# Patient Record
Sex: Female | Born: 1935 | Race: White | Hispanic: No | Smoking: Former smoker
Health system: Southern US, Community
[De-identification: ages and names within clinical notes are randomized; demographics above are authoritative.]

## PROBLEM LIST (undated history)

## (undated) DIAGNOSIS — I4891 Unspecified atrial fibrillation: Secondary | ICD-10-CM

## (undated) DIAGNOSIS — J449 Chronic obstructive pulmonary disease, unspecified: Secondary | ICD-10-CM

## (undated) DIAGNOSIS — I10 Essential (primary) hypertension: Secondary | ICD-10-CM

## (undated) DIAGNOSIS — E119 Type 2 diabetes mellitus without complications: Secondary | ICD-10-CM

## (undated) DIAGNOSIS — K219 Gastro-esophageal reflux disease without esophagitis: Secondary | ICD-10-CM

## (undated) DIAGNOSIS — Z955 Presence of coronary angioplasty implant and graft: Secondary | ICD-10-CM

## (undated) DIAGNOSIS — Z974 Presence of external hearing-aid: Secondary | ICD-10-CM

## (undated) DIAGNOSIS — Z972 Presence of dental prosthetic device (complete) (partial): Secondary | ICD-10-CM

## (undated) DIAGNOSIS — I509 Heart failure, unspecified: Secondary | ICD-10-CM

## (undated) DIAGNOSIS — Z8611 Personal history of tuberculosis: Secondary | ICD-10-CM

## (undated) DIAGNOSIS — E785 Hyperlipidemia, unspecified: Secondary | ICD-10-CM

## (undated) DIAGNOSIS — J45909 Unspecified asthma, uncomplicated: Secondary | ICD-10-CM

## (undated) DIAGNOSIS — I2510 Atherosclerotic heart disease of native coronary artery without angina pectoris: Secondary | ICD-10-CM

## (undated) HISTORY — PX: NO PAST SURGERIES: SHX2092

## (undated) HISTORY — PX: CATARACT EXTRACTION: SUR2

## (undated) HISTORY — DX: Heart failure, unspecified: I50.9

## (undated) HISTORY — PX: KNEE SURGERY: SHX244

## (undated) HISTORY — PX: CORONARY/GRAFT ACUTE MI REVASCULARIZATION: CATH118305

---

## 2014-04-25 DIAGNOSIS — Z8673 Personal history of transient ischemic attack (TIA), and cerebral infarction without residual deficits: Secondary | ICD-10-CM

## 2014-04-25 HISTORY — DX: Personal history of transient ischemic attack (TIA), and cerebral infarction without residual deficits: Z86.73

## 2015-04-26 DIAGNOSIS — I639 Cerebral infarction, unspecified: Secondary | ICD-10-CM

## 2015-04-26 HISTORY — DX: Cerebral infarction, unspecified: I63.9

## 2020-04-16 DIAGNOSIS — I10 Essential (primary) hypertension: Secondary | ICD-10-CM | POA: Insufficient documentation

## 2020-06-19 DIAGNOSIS — E119 Type 2 diabetes mellitus without complications: Secondary | ICD-10-CM | POA: Insufficient documentation

## 2020-06-19 DIAGNOSIS — Z955 Presence of coronary angioplasty implant and graft: Secondary | ICD-10-CM

## 2020-06-19 HISTORY — DX: Presence of coronary angioplasty implant and graft: Z95.5

## 2020-11-16 ENCOUNTER — Other Ambulatory Visit: Payer: Self-pay

## 2020-11-16 ENCOUNTER — Encounter: Payer: Self-pay | Admitting: Family Medicine

## 2020-11-16 ENCOUNTER — Inpatient Hospital Stay
Admission: EM | Admit: 2020-11-16 | Discharge: 2020-11-23 | DRG: 522 | Disposition: A | Discharge status: Rehabilitation Facility | Payer: Medicaid Other | Attending: Internal Medicine | Admitting: Internal Medicine

## 2020-11-16 ENCOUNTER — Emergency Department: Payer: Medicaid Other

## 2020-11-16 DIAGNOSIS — K219 Gastro-esophageal reflux disease without esophagitis: Secondary | ICD-10-CM | POA: Diagnosis present

## 2020-11-16 DIAGNOSIS — K5900 Constipation, unspecified: Secondary | ICD-10-CM

## 2020-11-16 DIAGNOSIS — Z20822 Contact with and (suspected) exposure to covid-19: Secondary | ICD-10-CM | POA: Diagnosis present

## 2020-11-16 DIAGNOSIS — Z681 Body mass index (BMI) 19 or less, adult: Secondary | ICD-10-CM

## 2020-11-16 DIAGNOSIS — I4819 Other persistent atrial fibrillation: Secondary | ICD-10-CM | POA: Diagnosis present

## 2020-11-16 DIAGNOSIS — S72009A Fracture of unspecified part of neck of unspecified femur, initial encounter for closed fracture: Secondary | ICD-10-CM

## 2020-11-16 DIAGNOSIS — W010XXA Fall on same level from slipping, tripping and stumbling without subsequent striking against object, initial encounter: Secondary | ICD-10-CM | POA: Diagnosis present

## 2020-11-16 DIAGNOSIS — S72001A Fracture of unspecified part of neck of right femur, initial encounter for closed fracture: Secondary | ICD-10-CM | POA: Diagnosis present

## 2020-11-16 DIAGNOSIS — I2510 Atherosclerotic heart disease of native coronary artery without angina pectoris: Secondary | ICD-10-CM | POA: Diagnosis present

## 2020-11-16 DIAGNOSIS — Z7984 Long term (current) use of oral hypoglycemic drugs: Secondary | ICD-10-CM | POA: Diagnosis not present

## 2020-11-16 DIAGNOSIS — E785 Hyperlipidemia, unspecified: Secondary | ICD-10-CM | POA: Diagnosis present

## 2020-11-16 DIAGNOSIS — Z79899 Other long term (current) drug therapy: Secondary | ICD-10-CM | POA: Diagnosis not present

## 2020-11-16 DIAGNOSIS — I10 Essential (primary) hypertension: Secondary | ICD-10-CM | POA: Diagnosis present

## 2020-11-16 DIAGNOSIS — E119 Type 2 diabetes mellitus without complications: Secondary | ICD-10-CM | POA: Diagnosis present

## 2020-11-16 DIAGNOSIS — S72001K Fracture of unspecified part of neck of right femur, subsequent encounter for closed fracture with nonunion: Secondary | ICD-10-CM

## 2020-11-16 DIAGNOSIS — E440 Moderate protein-calorie malnutrition: Secondary | ICD-10-CM | POA: Diagnosis present

## 2020-11-16 DIAGNOSIS — W19XXXA Unspecified fall, initial encounter: Secondary | ICD-10-CM

## 2020-11-16 DIAGNOSIS — R0602 Shortness of breath: Secondary | ICD-10-CM

## 2020-11-16 HISTORY — DX: Gastro-esophageal reflux disease without esophagitis: K21.9

## 2020-11-16 HISTORY — DX: Fracture of unspecified part of neck of unspecified femur, initial encounter for closed fracture: S72.009A

## 2020-11-16 HISTORY — DX: Unspecified atrial fibrillation: I48.91

## 2020-11-16 HISTORY — DX: Hyperlipidemia, unspecified: E78.5

## 2020-11-16 HISTORY — DX: Essential (primary) hypertension: I10

## 2020-11-16 HISTORY — DX: Type 2 diabetes mellitus without complications: E11.9

## 2020-11-16 LAB — COMPREHENSIVE METABOLIC PANEL
ALT: 21 U/L (ref 0–44)
AST: 27 U/L (ref 15–41)
Albumin: 3.1 g/dL — ABNORMAL LOW (ref 3.5–5.0)
Alkaline Phosphatase: 104 U/L (ref 38–126)
Anion gap: 9 (ref 5–15)
BUN: 25 mg/dL — ABNORMAL HIGH (ref 8–23)
CO2: 24 mmol/L (ref 22–32)
Calcium: 8.9 mg/dL (ref 8.9–10.3)
Chloride: 101 mmol/L (ref 98–111)
Creatinine, Ser: 0.86 mg/dL (ref 0.44–1.00)
GFR, Estimated: >60 mL/min (ref >60)
Glucose, Bld: 111 mg/dL — ABNORMAL HIGH (ref 70–99)
Potassium: 4.4 mmol/L (ref 3.5–5.1)
Sodium: 134 mmol/L — ABNORMAL LOW (ref 135–145)
Total Bilirubin: 1 mg/dL (ref 0.3–1.2)
Total Protein: 6.5 g/dL (ref 6.5–8.1)

## 2020-11-16 LAB — CBC WITH DIFFERENTIAL/PLATELET
Abs Immature Granulocytes: 0.11 10*3/uL — ABNORMAL HIGH (ref 0.00–0.07)
Basophils Absolute: 0.1 10*3/uL (ref 0.0–0.1)
Basophils Relative: 1 %
Eosinophils Absolute: 0.5 10*3/uL (ref 0.0–0.5)
Eosinophils Relative: 4 %
HCT: 37.5 % (ref 36.0–46.0)
Hemoglobin: 11.9 g/dL — ABNORMAL LOW (ref 12.0–15.0)
Immature Granulocytes: 1 %
Lymphocytes Relative: 13 %
Lymphs Abs: 1.8 10*3/uL (ref 0.7–4.0)
MCH: 27.8 pg (ref 26.0–34.0)
MCHC: 31.7 g/dL (ref 30.0–36.0)
MCV: 87.6 fL (ref 80.0–100.0)
Monocytes Absolute: 1.5 10*3/uL — ABNORMAL HIGH (ref 0.1–1.0)
Monocytes Relative: 11 %
Neutro Abs: 9.8 10*3/uL — ABNORMAL HIGH (ref 1.7–7.7)
Neutrophils Relative %: 70 %
Platelets: 367 10*3/uL (ref 150–400)
RBC: 4.28 MIL/uL (ref 3.87–5.11)
RDW: 15.4 % (ref 11.5–15.5)
WBC: 13.8 10*3/uL — ABNORMAL HIGH (ref 4.0–10.5)
nRBC: 0 % (ref 0.0–0.2)

## 2020-11-16 LAB — RESP PANEL BY RT-PCR (FLU A&B, COVID) ARPGX2
Influenza A by PCR: NEGATIVE
Influenza B by PCR: NEGATIVE
SARS Coronavirus 2 by RT PCR: NEGATIVE

## 2020-11-16 LAB — TYPE AND SCREEN
ABO/RH(D): O POS
Antibody Screen: NEGATIVE

## 2020-11-16 LAB — PROTIME-INR
INR: 1.6 — ABNORMAL HIGH (ref 0.8–1.2)
Prothrombin Time: 19 seconds — ABNORMAL HIGH (ref 11.4–15.2)

## 2020-11-16 LAB — TROPONIN I (HIGH SENSITIVITY): Troponin I (High Sensitivity): 7 ng/L (ref <18)

## 2020-11-16 MED ORDER — ACETAMINOPHEN 650 MG RE SUPP: 650.0000 mg | Freq: Four times a day (QID) | RECTAL | Status: DC | PRN

## 2020-11-16 MED ORDER — ARFORMOTEROL TARTRATE 15 MCG/2ML IN NEBU
15.0000 ug | INHALATION_SOLUTION | Freq: Two times a day (BID) | RESPIRATORY_TRACT | Status: DC
  Filled 2020-11-16 (×2): qty 2

## 2020-11-16 MED ORDER — UMECLIDINIUM BROMIDE 62.5 MCG/INH IN AEPB: 1.0000 | INHALATION_SPRAY | Freq: Every day | RESPIRATORY_TRACT | Status: DC

## 2020-11-16 MED ORDER — INSULIN ASPART 100 UNIT/ML IJ SOLN
0.0000 [IU] | Freq: Three times a day (TID) | INTRAMUSCULAR | Status: DC
  Administered 2020-11-19: 2 [IU] via SUBCUTANEOUS
  Administered 2020-11-20 – 2020-11-21 (×3): 1 [IU] via SUBCUTANEOUS
  Administered 2020-11-21 – 2020-11-22 (×3): 2 [IU] via SUBCUTANEOUS
  Administered 2020-11-23 (×2): 1 [IU] via SUBCUTANEOUS
  Filled 2020-11-16 (×8): qty 1

## 2020-11-16 MED ORDER — NON FORMULARY: 2.0000 | Freq: Two times a day (BID) | Status: DC

## 2020-11-16 MED ORDER — CEFAZOLIN SODIUM-DEXTROSE 2-4 GM/100ML-% IV SOLN
2.0000 g | INTRAVENOUS | Status: AC
  Administered 2020-11-19: 2 g via INTRAVENOUS

## 2020-11-16 MED ORDER — FLUTICASONE PROPIONATE HFA 44 MCG/ACT IN AERO
2.0000 | INHALATION_SPRAY | Freq: Two times a day (BID) | RESPIRATORY_TRACT | Status: DC
  Filled 2020-11-16: qty 1

## 2020-11-16 MED ORDER — ALBUTEROL SULFATE HFA 108 (90 BASE) MCG/ACT IN AERS
1.0000 | INHALATION_SPRAY | Freq: Four times a day (QID) | RESPIRATORY_TRACT | Status: DC | PRN
  Filled 2020-11-16: qty 6.7

## 2020-11-16 MED ORDER — ALBUTEROL SULFATE (2.5 MG/3ML) 0.083% IN NEBU: 2.5000 mg | INHALATION_SOLUTION | Freq: Four times a day (QID) | RESPIRATORY_TRACT | Status: DC | PRN

## 2020-11-16 MED ORDER — BUDESONIDE 0.25 MG/2ML IN SUSP: 0.2500 mg | Freq: Two times a day (BID) | RESPIRATORY_TRACT | Status: DC

## 2020-11-16 MED ORDER — ATORVASTATIN CALCIUM 20 MG PO TABS
80.0000 mg | ORAL_TABLET | Freq: Every evening | ORAL | Status: DC
  Administered 2020-11-16 – 2020-11-22 (×7): 80 mg via ORAL
  Filled 2020-11-16 (×7): qty 4

## 2020-11-16 MED ORDER — ALBUTEROL SULFATE HFA 108 (90 BASE) MCG/ACT IN AERS: 1.0000 | INHALATION_SPRAY | Freq: Four times a day (QID) | RESPIRATORY_TRACT | Status: DC | PRN

## 2020-11-16 MED ORDER — ONDANSETRON HCL 4 MG/2ML IJ SOLN
4.0000 mg | Freq: Four times a day (QID) | INTRAMUSCULAR | Status: DC | PRN
  Administered 2020-11-16 – 2020-11-18 (×4): 4 mg via INTRAVENOUS
  Filled 2020-11-16 (×4): qty 2

## 2020-11-16 MED ORDER — UMECLIDINIUM BROMIDE 62.5 MCG/INH IN AEPB
1.0000 | INHALATION_SPRAY | Freq: Every day | RESPIRATORY_TRACT | Status: DC
  Filled 2020-11-16: qty 7

## 2020-11-16 MED ORDER — ARFORMOTEROL TARTRATE 15 MCG/2ML IN NEBU: 15.0000 ug | INHALATION_SOLUTION | Freq: Two times a day (BID) | RESPIRATORY_TRACT | Status: DC

## 2020-11-16 MED ORDER — INSULIN ASPART 100 UNIT/ML IJ SOLN: 0.0000 [IU] | Freq: Every day | INTRAMUSCULAR | Status: DC

## 2020-11-16 MED ORDER — RAMIPRIL 10 MG PO CAPS
10.0000 mg | ORAL_CAPSULE | Freq: Every day | ORAL | Status: DC
  Administered 2020-11-17 – 2020-11-18 (×2): 10 mg via ORAL
  Filled 2020-11-16 (×2): qty 1

## 2020-11-16 MED ORDER — POLYETHYLENE GLYCOL 3350 17 G PO PACK
17.0000 g | PACK | Freq: Every day | ORAL | Status: DC | PRN
  Administered 2020-11-17 – 2020-11-21 (×3): 17 g via ORAL
  Filled 2020-11-16 (×4): qty 1

## 2020-11-16 MED ORDER — KETOROLAC TROMETHAMINE 30 MG/ML IJ SOLN: 15.0000 mg | Freq: Four times a day (QID) | INTRAMUSCULAR | Status: DC | PRN

## 2020-11-16 MED ORDER — BECLOMETHASONE DIPROP HFA 80 MCG/ACT IN AERB
1.0000 | INHALATION_SPRAY | Freq: Two times a day (BID) | RESPIRATORY_TRACT | Status: DC
  Administered 2020-11-17 – 2020-11-23 (×12): 1 via RESPIRATORY_TRACT

## 2020-11-16 MED ORDER — METFORMIN HCL 500 MG PO TABS
500.0000 mg | ORAL_TABLET | Freq: Every evening | ORAL | Status: DC
  Administered 2020-11-16 – 2020-11-17 (×2): 500 mg via ORAL
  Filled 2020-11-16 (×2): qty 1

## 2020-11-16 MED ORDER — MUPIROCIN 2 % EX OINT
1.0000 "application " | TOPICAL_OINTMENT | Freq: Two times a day (BID) | CUTANEOUS | Status: AC
  Administered 2020-11-17 – 2020-11-20 (×6): 1 via NASAL
  Filled 2020-11-16: qty 22

## 2020-11-16 MED ORDER — DILTIAZEM HCL 30 MG PO TABS
60.0000 mg | ORAL_TABLET | Freq: Two times a day (BID) | ORAL | Status: DC
  Administered 2020-11-16 – 2020-11-23 (×13): 60 mg via ORAL
  Filled 2020-11-16 (×13): qty 2

## 2020-11-16 MED ORDER — OXYCODONE-ACETAMINOPHEN 5-325 MG PO TABS: 1.0000 | ORAL_TABLET | Freq: Four times a day (QID) | ORAL | Status: DC | PRN

## 2020-11-16 MED ORDER — ONDANSETRON HCL 4 MG PO TABS
4.0000 mg | ORAL_TABLET | Freq: Four times a day (QID) | ORAL | Status: DC | PRN
  Filled 2020-11-16: qty 1

## 2020-11-16 MED ORDER — GLYCOPYRROLATE-FORMOTEROL 9-4.8 MCG/ACT IN AERO
2.0000 | INHALATION_SPRAY | Freq: Two times a day (BID) | RESPIRATORY_TRACT | Status: DC
  Administered 2020-11-17 – 2020-11-23 (×12): 2 via RESPIRATORY_TRACT
  Filled 2020-11-16: qty 1

## 2020-11-16 MED ORDER — FENTANYL CITRATE (PF) 100 MCG/2ML IJ SOLN
50.0000 ug | INTRAMUSCULAR | Status: DC | PRN
Start: 2020-11-16 — End: 2020-11-20

## 2020-11-16 MED ORDER — PANTOPRAZOLE SODIUM 20 MG PO TBEC
20.0000 mg | DELAYED_RELEASE_TABLET | Freq: Every day | ORAL | Status: DC
  Administered 2020-11-17 – 2020-11-23 (×6): 20 mg via ORAL
  Filled 2020-11-16 (×7): qty 1

## 2020-11-16 MED ORDER — OXYCODONE-ACETAMINOPHEN 5-325 MG PO TABS
1.0000 | ORAL_TABLET | Freq: Four times a day (QID) | ORAL | Status: DC | PRN
  Administered 2020-11-17 – 2020-11-18 (×3): 1 via ORAL
  Filled 2020-11-16 (×4): qty 1

## 2020-11-16 MED ORDER — BISOPROLOL FUMARATE 5 MG PO TABS
5.0000 mg | ORAL_TABLET | Freq: Two times a day (BID) | ORAL | Status: DC
  Administered 2020-11-16 – 2020-11-23 (×13): 5 mg via ORAL
  Filled 2020-11-16 (×16): qty 1

## 2020-11-16 MED ORDER — ACETAMINOPHEN 325 MG PO TABS
650.0000 mg | ORAL_TABLET | Freq: Four times a day (QID) | ORAL | Status: DC | PRN
  Administered 2020-11-16 – 2020-11-17 (×2): 650 mg via ORAL
  Filled 2020-11-16 (×2): qty 2

## 2020-11-16 MED ORDER — MORPHINE SULFATE (PF) 2 MG/ML IV SOLN
2.0000 mg | INTRAVENOUS | Status: DC | PRN
  Administered 2020-11-16 – 2020-11-18 (×6): 2 mg via INTRAVENOUS
  Filled 2020-11-16 (×6): qty 1

## 2020-11-16 NOTE — H&P (Addendum)
History and Physical    Angelica Ferguson GEX:[id number] DOB: [birth year] DOA: 11/16/2020  PCP: Angelica Kotyk, NP  Chief Complaint: Right hip pain  HPI: Angelica Ferguson is a 87 y.o. female with a past medical history of persistent atrial fibrillation on edoxaban, coronary artery disease with PCI, hypertension, non-insulin-dependent diabetes mellitus type 2, GERD.  The patient is visiting family from [place].  She had a mechanical fall last Wednesday.  Has been unable to ambulate and is still having right hip pain since.  Therefore came to the emergency department for further evaluation of the right hip pain.  Hemodynamics are stable.  Plain film imaging shows a right femoral neck fracture with varus angulation.  Orthopedics has been contacted and Dr. Martha Ferguson plans to see the patient after clinic later this afternoon.  Last dose of edoxaban was early this morning.  We will hold this medication.  N.p.o. after midnight.  Daughter present at bedside. Patient denies any urinary complaints, cough, shortness of breath, nausea, vomiting or diarrhea.    ED Course: CBC, CMP, INR, troponin, type/screen.  Right shoulder x-ray.  Right hip/pelvis x-ray.  Review of Systems: 14 point review of systems is negative except for what is mentioned above in the HPI.   Past Medical History:  Diagnosis Date   Atrial fibrillation (HCC)    Diabetes mellitus without complication (HCC)    GERD (gastroesophageal reflux disease)    Hyperlipidemia    Hypertension     Past Surgical History:  Procedure Laterality Date   CATARACT EXTRACTION Left    CORONARY/GRAFT ACUTE MI REVASCULARIZATION     KNEE SURGERY Left    NO PAST SURGERIES      Social History   Socioeconomic History   Marital status: Widowed    Spouse name: Not on file   Number of children: Not on file   Years of education: Not on file   Highest education level: Not on file  Occupational History   Not on file  Tobacco Use   Smoking status:  Not on file   Smokeless tobacco: Not on file  Substance and Sexual Activity   Alcohol use: Not on file   Drug use: Not on file   Sexual activity: Not on file  Other Topics Concern   Not on file  Social History Narrative   Not on file   Social Determinants of Health   Financial Resource Strain: Not on file  Food Insecurity: Not on file  Transportation Needs: Not on file  Physical Activity: Not on file  Stress: Not on file  Social Connections: Not on file  Intimate Partner Violence: Not on file    No Known Allergies  Family history: Reviewed and felt to be noncontributory.  Prior to Admission medications   Medication Sig Start Date End Date Taking? Authorizing Provider  albuterol (VENTOLIN HFA) 108 (90 Base) MCG/ACT inhaler Inhale 1 puff into the lungs every 6 (six) hours as needed for wheezing or shortness of breath.   Yes [provider]  atorvastatin (LIPITOR) 80 MG tablet Take 80 mg by mouth every evening.   Yes [provider]  bisoprolol (ZEBETA) 5 MG tablet Take 1-2 mg by mouth See admin instructions. Take 7.5 mg in the morning, and 5 mg in the evening   Yes [provider]  diltiazem (CARDIZEM) 60 MG tablet Take 60 mg by mouth 2 (two) times daily.   Yes [provider]  edoxaban (SAVAYSA) 30 MG TABS tablet Take 30 mg by mouth  daily.   Yes [provider]  Glycopyrrolate-Formoterol (BEVESPI AEROSPHERE) 9-4.8 MCG/ACT AERO Inhale 2 puffs into the lungs 2 (two) times daily.   Yes [provider]  lansoprazole (PREVACID) 15 MG capsule Take 15 mg by mouth daily.   Yes [provider]  metFORMIN (GLUCOPHAGE) 500 MG tablet Take 500 mg by mouth every evening.   Yes [provider]  ramipril (ALTACE) 10 MG capsule Take 10 mg by mouth daily.   Yes [provider]    Physical Exam: Vitals:   11/16/20 1116 11/16/20 1557  BP: (!) 140/115 (!) 142/87  Pulse: 90 76  Resp: 16 18  Temp: 98 F (36.7 C)  98.6 F (37 C)  TempSrc: Oral Oral  SpO2: 96% 92%     General:  Appears calm and comfortable and is in NAD Cardiovascular:  RRR, no m/r/g.  Respiratory:   CTA bilaterally with no wheezes/rales/rhonchi.  Normal respiratory effort. Abdomen:  soft, NT, ND, NABS Skin:  no rash or induration seen on limited exam Musculoskeletal:  grossly normal tone BUE/BLE, good ROM, no bony abnormality Lower extremity:  No LE edema.  Limited foot exam with no ulcerations.  2+ distal pulses.  Right hip pain to palpation. Slightly shortened and externally rotated. Psychiatric:  grossly normal mood and affect, speech fluent and appropriate, AOx3 Neurologic:  CN 2-12 grossly intact, moves all extremities in coordinated fashion, sensation intact    Radiological Exams on Admission: Independently reviewed - see discussion in A/P where applicable  DG Shoulder Right  Result Date: 11/16/2020 CLINICAL DATA:  Fall/pain EXAM: RIGHT SHOULDER - 2+ VIEW COMPARISON:  None. FINDINGS: Degenerative changes in the [facility] joint with joint space narrowing and spurring. Glenohumeral joint is maintained. No acute bony abnormality. Specifically, no fracture, subluxation, or dislocation. Soft tissues are intact. IMPRESSION: Degenerative changes in the right AC joint. No acute bony abnormality. Electronically Signed   By: Charlett Nose M.D.   On: 11/16/2020 12:33   DG Hip Unilat W or Wo Pelvis 2-3 Views Right  Result Date: 11/16/2020 CLINICAL DATA:  Fall 1 week ago.  Right hip pain EXAM: DG HIP (WITH OR WITHOUT PELVIS) 2-3V RIGHT COMPARISON:  None. FINDINGS: There is a right femoral neck fracture with varus angulation. No subluxation or dislocation. Left hip unremarkable. SI joints symmetric and unremarkable. IMPRESSION: Right femoral neck fracture with varus angulation. Electronically Signed   By: Charlett Nose M.D.   On: 11/16/2020 12:33    EKG: Independently reviewed.  A. fib rate 104   Labs on Admission: I have personally reviewed  the available labs and imaging studies at the time of the admission.  Pertinent labs: WBC 13.8, sodium 134, BUN 25, blood glucose 111, INR 1.6.     Assessment/Plan: Right femoral neck fracture secondary to mechanical fall: The patient will be admitted to the medical/surgical floor under inpatient status.  Plain film imaging notes a right femoral neck fracture with varus angulation.  Pain scale has been ordered.  Orthopedics has been consulted.  Planned surgery on Thursday. PT/OT and TOC consulted. NPO after midnight on Thursday.  Persistent atrial fibrillation: Last dose of edoxaban this morning.  We will continue to hold home edoxaban 30 mg daily.  The patient is on bisoprolol 7.5 mg in the morning and 5 mg in the evening.  We do not have any 2.5 mg pills therefore we will continue with 5 mg twice a day.  Continue home Cardizem 60 mg twice a day.  EKG shows  A. fib that is relatively rate controlled with rate of 104.  Hypertension: Continue home ramipril 10 mg daily.  Non-insulin-dependent diabetes mellitus type 2: Continue home metformin 500 mg daily.  Coronary artery disease: Home edoxaban on hold.  Continue Lipitor 80 mg daily.  GERD: Continue home Prevacid 15 mg daily  Hyperlipidemia: Continue home Lipitor 80 mg daily.  Leukocytosis: Likely reactive.  Denies any urinary complaints, cough, shortness of breath, nausea vomiting or abdominal pain.  Will obtain urinalysis.  Level of Care: MedSurg DVT prophylaxis: SCDs Code Status: Full code Consults: Ortho Admission status: Inpatient   Verdia Kuba DO [facility]   How to contact the [facility] Attending or Consulting provider 7A - 7P or covering provider during after hours 7P -7A, for this patient?  Check the care team in [facility] and look for a) attending/consulting [facility] provider listed and b) the [facility] team listed Log into [email or web address] and use [place]'s universal password to access. If you do not have the password, please  contact the hospital operator. Locate the [facility] provider you are looking for under [facility] and page to a number that you can be directly reached. If you still have difficulty reaching the provider, please page the [facility] (Director on Call) for the Hospitalists listed on amion for assistance.   11/16/2020, 4:17 PM

## 2020-11-16 NOTE — ED Provider Notes (Signed)
[facility] Emergency Department Provider Note   ____________________________________________   Event Date/Time   First MD Initiated Contact with Patient 11/16/20 1444     (approximate)  I have reviewed the triage vital signs and the nursing notes.   HISTORY  Chief Complaint Fall    HPI Angelica Ferguson is a 87 y.o. female with a stated past medical history of A. fib, hypertension who presents for right shoulder and hip pain after a fall  LOCATION: Right shoulder and right hip DURATION: 2 days prior to arrival TIMING: Stable since onset SEVERITY: 4/10 QUALITY: Aching CONTEXT: Patient states that she tripped walking out the door and landed on top of her daughter on the right shoulder and right leg MODIFYING FACTORS: Any movement at the right hip or shoulder joint worsens this pain and it is relieved at rest ASSOCIATED SYMPTOMS: Denies   Per medical record review, patient has no chart history          No past medical history on file.  Patient Active Problem List   Diagnosis Date Noted   Hip fracture (HCC) 11/16/2020    Prior to Admission medications   Not on File    Allergies Patient has no known allergies.  No family history on file.  Social History    Review of Systems Constitutional: No fever/chills Eyes: No visual changes. ENT: No sore throat. Cardiovascular: Denies chest pain. Respiratory: Denies shortness of breath. Gastrointestinal: No abdominal pain.  No nausea, no vomiting.  No diarrhea. Genitourinary: Negative for dysuria. Musculoskeletal: Endorses acute right shoulder and hip pain Skin: Negative for rash. Neurological: Negative for headaches, weakness/numbness/paresthesias in any extremity Psychiatric: Negative for suicidal ideation/homicidal ideation   ____________________________________________   PHYSICAL EXAM:  VITAL SIGNS: ED Triage Vitals [11/16/20 1116]  Enc Vitals Group     BP (!) 140/115      Pulse Rate 90     Resp 16     Temp 98 F (36.7 C)     Temp Source Oral     SpO2 96 %     Weight      Height      Head Circumference      Peak Flow      Pain Score      Pain Loc      Pain Edu?      Excl. in GC?    Constitutional: Alert and oriented. Well appearing and in no acute distress. Eyes: Conjunctivae are normal. PERRL. Head: Atraumatic. Nose: No congestion/rhinnorhea. Mouth/Throat: Mucous membranes are moist. Neck: No stridor Cardiovascular: Grossly normal heart sounds.  Good peripheral circulation. Respiratory: Normal respiratory effort.  No retractions. Gastrointestinal: Soft and nontender. No distention. Musculoskeletal: Tenderness over the right lateral hip and with any range of motion at the right hip joint.  Tenderness palpation overlying the lateral right shoulder with minimal pain with range of motion Neurologic:  Normal speech and language. No gross focal neurologic deficits are appreciated. Skin:  Skin is warm and dry. No rash noted. Psychiatric: Mood and affect are normal. Speech and behavior are normal.  ____________________________________________   LABS (all labs ordered are listed, but only abnormal results are displayed)  Labs Reviewed  CBC WITH DIFFERENTIAL/PLATELET - Abnormal; Notable for the following components:      Result Value   WBC 13.8 (*)    Hemoglobin 11.9 (*)    Neutro Abs 9.8 (*)    Monocytes Absolute 1.5 (*)    Abs Immature Granulocytes 0.11 (*)  All other components within normal limits  COMPREHENSIVE METABOLIC PANEL  PROTIME-INR  TYPE AND SCREEN  TROPONIN I (HIGH SENSITIVITY)   ____________________________________________  EKG  ED ECG REPORT I, Merwyn Katos, the attending physician, personally viewed and interpreted this ECG.  Date: 11/16/2020 EKG Time: 1434 Rate: 104 Rhythm: Atrial fibrillation QRS Axis: normal Intervals: normal ST/T Wave abnormalities: normal Narrative Interpretation: Atrial fibrillation.  No  evidence of acute ischemia  ____________________________________________  RADIOLOGY  ED MD interpretation: X-ray of the right shoulder and hip show a right femoral neck fracture with varus angulation  Official radiology report(s): DG Shoulder Right  Result Date: 11/16/2020 CLINICAL DATA:  Fall/pain EXAM: RIGHT SHOULDER - 2+ VIEW COMPARISON:  None. FINDINGS: Degenerative changes in the [facility] joint with joint space narrowing and spurring. Glenohumeral joint is maintained. No acute bony abnormality. Specifically, no fracture, subluxation, or dislocation. Soft tissues are intact. IMPRESSION: Degenerative changes in the right AC joint. No acute bony abnormality. Electronically Signed   By: Charlett Nose M.D.   On: 11/16/2020 12:33   DG Hip Unilat W or Wo Pelvis 2-3 Views Right  Result Date: 11/16/2020 CLINICAL DATA:  Fall 1 week ago.  Right hip pain EXAM: DG HIP (WITH OR WITHOUT PELVIS) 2-3V RIGHT COMPARISON:  None. FINDINGS: There is a right femoral neck fracture with varus angulation. No subluxation or dislocation. Left hip unremarkable. SI joints symmetric and unremarkable. IMPRESSION: Right femoral neck fracture with varus angulation. Electronically Signed   By: Charlett Nose M.D.   On: 11/16/2020 12:33    ____________________________________________   PROCEDURES  Procedure(s) performed (including Critical Care):  Procedures   ____________________________________________   INITIAL IMPRESSION / ASSESSMENT AND PLAN / ED COURSE  As part of my medical decision making, I reviewed the following data within the electronic medical record, if available:  Nursing notes reviewed and incorporated, Labs reviewed, EKG interpreted, Old chart reviewed, Radiograph reviewed and Notes from prior ED visits reviewed and incorporated        Patient is a 87 year old female that presents for right shoulder and hip pain Workup: XR hip Findings: Right femoral neck fracture without dislocation Consult:  Orthopedic Surgery (query fascia iliacus block vs continued opiate pain control), hospitalist  Patient does not currently demonstrate complications of fracture such as compartment syndrome, arterial or nerve injury. Consult: Martha Clan, orthopedics Interventions: analgesia Disposition: Admit      ____________________________________________   FINAL CLINICAL IMPRESSION(S) / ED DIAGNOSES  Final diagnoses:  Fall, initial encounter  Closed fracture of neck of right femur, initial encounter [facility])     ED Discharge Orders     None        Note:  This document was prepared using Dragon voice recognition software and may include unintentional dictation errors.    Merwyn Katos, MD 11/16/20 [phone]

## 2020-11-16 NOTE — Consult Note (Signed)
ORTHOPAEDIC CONSULTATION  REQUESTING PHYSICIAN: Verdia Kuba, DO  Chief Complaint: Right hip pain status post fall  HPI: Angelica Ferguson is a 87 y.o. female with a history of atrial fibrillation on edoxaban who fell last Wednesday at home.  Patient's last dose of edoxaban was this morning.  Patient is visiting her daughter from [place].  Patient has had increasing pain over the past week.  She has not been able to stand or ambulate since the fall.  Patient was diagnosed with a right femoral neck hip fracture by x-ray upon arrival to [facility] emergency department by EMS.  Past Medical History:  Diagnosis Date   Atrial fibrillation (HCC)    Diabetes mellitus without complication (HCC)    GERD (gastroesophageal reflux disease)    Hyperlipidemia    Hypertension    Past Surgical History:  Procedure Laterality Date   CATARACT EXTRACTION Left    CORONARY/GRAFT ACUTE MI REVASCULARIZATION     KNEE SURGERY Left    NO PAST SURGERIES     Social History   Socioeconomic History   Marital status: Widowed    Spouse name: Not on file   Number of children: Not on file   Years of education: Not on file   Highest education level: Not on file  Occupational History   Not on file  Tobacco Use   Smoking status: Former    Types: Cigarettes   Smokeless tobacco: Never  Substance and Sexual Activity   Alcohol use: Not Currently   Drug use: Never   Sexual activity: Not on file  Other Topics Concern   Not on file  Social History Narrative   Not on file   Social Determinants of Health   Financial Resource Strain: Not on file  Food Insecurity: Not on file  Transportation Needs: Not on file  Physical Activity: Not on file  Stress: Not on file  Social Connections: Not on file   Family History  Problem Relation Age of Onset   Heart disease Sister    Cancer Sister    No Known Allergies Prior to Admission medications   Medication Sig Start Date End Date Taking? Authorizing  Provider  albuterol (VENTOLIN HFA) 108 (90 Base) MCG/ACT inhaler Inhale 1 puff into the lungs every 6 (six) hours as needed for wheezing or shortness of breath.   Yes [provider]  atorvastatin (LIPITOR) 80 MG tablet Take 80 mg by mouth every evening.   Yes [provider]  bisoprolol (ZEBETA) 5 MG tablet Take 1-2 mg by mouth See admin instructions. Take 7.5 mg in the morning, and 5 mg in the evening   Yes [provider]  diltiazem (CARDIZEM) 60 MG tablet Take 60 mg by mouth 2 (two) times daily.   Yes [provider]  edoxaban (SAVAYSA) 30 MG TABS tablet Take 30 mg by mouth daily.   Yes [provider]  Glycopyrrolate-Formoterol (BEVESPI AEROSPHERE) 9-4.8 MCG/ACT AERO Inhale 2 puffs into the lungs 2 (two) times daily.   Yes [provider]  lansoprazole (PREVACID) 15 MG capsule Take 15 mg by mouth daily.   Yes [provider]  metFORMIN (GLUCOPHAGE) 500 MG tablet Take 500 mg by mouth every evening.   Yes [provider]  ramipril (ALTACE) 10 MG capsule Take 10 mg by mouth daily.   Yes [provider]   DG Shoulder Right  Result Date: 11/16/2020 CLINICAL DATA:  Fall/pain EXAM: RIGHT SHOULDER - 2+ VIEW COMPARISON:  None. FINDINGS: Degenerative changes in  the [facility] joint with joint space narrowing and spurring. Glenohumeral joint is maintained. No acute bony abnormality. Specifically, no fracture, subluxation, or dislocation. Soft tissues are intact. IMPRESSION: Degenerative changes in the right AC joint. No acute bony abnormality. Electronically Signed   By: Charlett Nose M.D.   On: 11/16/2020 12:33   DG Hip Unilat W or Wo Pelvis 2-3 Views Right  Result Date: 11/16/2020 CLINICAL DATA:  Fall 1 week ago.  Right hip pain EXAM: DG HIP (WITH OR WITHOUT PELVIS) 2-3V RIGHT COMPARISON:  None. FINDINGS: There is a right femoral neck fracture with varus angulation. No subluxation or dislocation. Left hip unremarkable. SI joints  symmetric and unremarkable. IMPRESSION: Right femoral neck fracture with varus angulation. Electronically Signed   By: Charlett Nose M.D.   On: 11/16/2020 12:33    Positive ROS: All other systems have been reviewed and were otherwise negative with the exception of those mentioned in the HPI and as above.  Physical Exam: General: Alert, no acute distress  MUSCULOSKELETAL: Right lower extremity: The right lower extremity is shortened and slightly externally rotated.  Skin is intact.  Patient has palpable pedal pulses, intact/light touch and intact motor function distally.  There is no significant swelling of the right lower leg or thigh.  Assessment: Right displaced femoral neck hip fracture  Plan: I reviewed the details of the fracture with the patient and her daughter.  I have recommended a hemiarthroplasty for treatment of her fracture.  I explained the details of the operation as well as the postoperative course.  I discussed the risks and benefits of surgery. The patient's    daughter understood the risks include but are not limited to infection, bleeding requiring blood transfusion, nerve or blood vessel injury, joint stiffness or loss of motion, persistent pain, weakness or instability, fracture, dislocation, leg length discrepancy, change in lower extremity rotation and hardware failure and the need for further surgery including conversion to a right total hip arthroplasty. Medical risks include but are not limited to DVT and pulmonary embolism, myocardial infarction, stroke, pneumonia, respiratory failure and death. Patient understood these risks and wished to proceed.   Since the patient took edoxaban this morning, surgery will need to be delayed for 3 days.  Plan for surgery on Thursday AM.     Juanell Fairly, MD    11/16/2020 7:07 PM

## 2020-11-16 NOTE — ED Triage Notes (Addendum)
Pt comes into the ED via EMS from daughter home where she is visiting from [place], pt tripped walking out the door Wednesday and the pt landed on top of her daughter, right shoulder and leg pain,

## 2020-11-17 LAB — GLUCOSE, CAPILLARY
Glucose-Capillary: 127 mg/dL — ABNORMAL HIGH (ref 70–99)
Glucose-Capillary: 117 mg/dL — ABNORMAL HIGH (ref 70–99)
Glucose-Capillary: 120 mg/dL — ABNORMAL HIGH (ref 70–99)
Glucose-Capillary: 139 mg/dL — ABNORMAL HIGH (ref 70–99)

## 2020-11-17 LAB — URINALYSIS, COMPLETE (UACMP) WITH MICROSCOPIC
Bilirubin Urine: NEGATIVE
Glucose, UA: NEGATIVE mg/dL
Ketones, ur: NEGATIVE mg/dL
Leukocytes,Ua: NEGATIVE
Nitrite: NEGATIVE
Protein, ur: NEGATIVE mg/dL
Specific Gravity, Urine: 1.025 (ref 1.005–1.030)
pH: 5 (ref 5.0–8.0)

## 2020-11-17 LAB — SURGICAL PCR SCREEN
MRSA, PCR: NEGATIVE
Staphylococcus aureus: NEGATIVE

## 2020-11-17 MED ORDER — ENOXAPARIN SODIUM 40 MG/0.4ML IJ SOSY: 40.0000 mg | PREFILLED_SYRINGE | INTRAMUSCULAR | Status: DC

## 2020-11-17 MED ORDER — ENOXAPARIN SODIUM 30 MG/0.3ML IJ SOSY
30.0000 mg | PREFILLED_SYRINGE | INTRAMUSCULAR | Status: DC
  Administered 2020-11-17: 30 mg via SUBCUTANEOUS
  Filled 2020-11-17: qty 0.3

## 2020-11-17 NOTE — Progress Notes (Signed)
PT Cancellation Note  Patient Details Name: Angelica Ferguson MRN: [id number] DOB: 07-29-1932   Cancelled Treatment:    Reason Eval/Treat Not Completed: Medical issues which prohibited therapy.  Consult received, chart reviewed. Pt pending hemiarthroplasty on Thursday, 7/28. Per RN, surgeon wants pt to be "bedrest until surgery." Will sign off. Please re-consult after surgery for initiation of therapy services as appropriate.    Nolon Bussing, PT, DPT 11/17/20, 2:01 PM

## 2020-11-17 NOTE — Progress Notes (Signed)
Subjective:  Patient seen in her hospital room today.  She is sitting up in bed and  denies any significant right hip pain.  Objective:   VITALS:   Vitals:   11/16/20 2000 11/17/20 0338 11/17/20 0722 11/17/20 1139  BP: 133/80 (!) 106/45 (!) 142/67 (!) 110/59  Pulse: 99 65 86 62  Resp: 17 19 18 18   Temp: 98.4 F (36.9 C) 98 F (36.7 C) 98.4 F (36.9 C) 97.9 F (36.6 C)  TempSrc: Oral     SpO2:  93% 95% 95%    PHYSICAL EXAM: Right lower extremity Neurovascular intact Sensation intact distally Intact pulses distally Dorsiflexion/Plantar flexion intact No cellulitis present Compartment soft  LABS  Results for orders placed or performed during the hospital encounter of 11/16/20 (from the past 24 hour(s))  Comprehensive metabolic panel     Status: Abnormal   Collection Time: 11/16/20  2:34 PM  Result Value Ref Range   Sodium 134 (L) 135 - 145 mmol/L   Potassium 4.4 3.5 - 5.1 mmol/L   Chloride 101 98 - 111 mmol/L   CO2 24 22 - 32 mmol/L   Glucose, Bld 111 (H) 70 - 99 mg/dL   BUN 25 (H) 8 - 23 mg/dL   Creatinine, Ser 11/18/20 0.44 - 1.00 mg/dL   Calcium 8.9 8.9 - 4.09 mg/dL   Total Protein 6.5 6.5 - 8.1 g/dL   Albumin 3.1 (L) 3.5 - 5.0 g/dL   AST 27 15 - 41 U/L   ALT 21 0 - 44 U/L   Alkaline Phosphatase 104 38 - 126 U/L   Total Bilirubin 1.0 0.3 - 1.2 mg/dL   GFR, Estimated 73.5 >32 mL/min   Anion gap 9 5 - 15  CBC with Differential     Status: Abnormal   Collection Time: 11/16/20  2:34 PM  Result Value Ref Range   WBC 13.8 (H) 4.0 - 10.5 K/uL   RBC 4.28 3.87 - 5.11 MIL/uL   Hemoglobin 11.9 (L) 12.0 - 15.0 g/dL   HCT 11/18/20 24.2 - 68.3 %   MCV 87.6 80.0 - 100.0 fL   MCH 27.8 26.0 - 34.0 pg   MCHC 31.7 30.0 - 36.0 g/dL   RDW 41.9 62.2 - 29.7 %   Platelets 367 150 - 400 K/uL   nRBC 0.0 0.0 - 0.2 %   Neutrophils Relative % 70 %   Neutro Abs 9.8 (H) 1.7 - 7.7 K/uL   Lymphocytes Relative 13 %   Lymphs Abs 1.8 0.7 - 4.0 K/uL   Monocytes Relative 11 %   Monocytes  Absolute 1.5 (H) 0.1 - 1.0 K/uL   Eosinophils Relative 4 %   Eosinophils Absolute 0.5 0.0 - 0.5 K/uL   Basophils Relative 1 %   Basophils Absolute 0.1 0.0 - 0.1 K/uL   Immature Granulocytes 1 %   Abs Immature Granulocytes 0.11 (H) 0.00 - 0.07 K/uL  Protime-INR     Status: Abnormal   Collection Time: 11/16/20  2:34 PM  Result Value Ref Range   Prothrombin Time 19.0 (H) 11.4 - 15.2 seconds   INR 1.6 (H) 0.8 - 1.2  Type and screen [facility]     Status: None   Collection Time: 11/16/20  2:34 PM  Result Value Ref Range   ABO/RH(D) O POS    Antibody Screen NEG    Sample Expiration      11/19/2020,2359 Performed at [facility], [address] [place], [place]   Troponin  I (High Sensitivity)     Status: None   Collection Time: 11/16/20  2:34 PM  Result Value Ref Range   Troponin I (High Sensitivity) 7 <18 ng/L  Resp Panel by RT-PCR (Flu A&B, Covid) Nasopharyngeal Swab     Status: None   Collection Time: 11/16/20  2:34 PM   Specimen: Nasopharyngeal Swab; Nasopharyngeal(NP) swabs in vial transport medium  Result Value Ref Range   SARS Coronavirus 2 by RT PCR NEGATIVE NEGATIVE   Influenza A by PCR NEGATIVE NEGATIVE   Influenza B by PCR NEGATIVE NEGATIVE  Urinalysis, Complete w Microscopic Urine, Clean Catch     Status: Abnormal   Collection Time: 11/17/20  1:43 AM  Result Value Ref Range   Color, Urine YELLOW (A) YELLOW   APPearance CLEAR (A) CLEAR   Specific Gravity, Urine 1.025 1.005 - 1.030   pH 5.0 5.0 - 8.0   Glucose, UA NEGATIVE NEGATIVE mg/dL   Hgb urine dipstick SMALL (A) NEGATIVE   Bilirubin Urine NEGATIVE NEGATIVE   Ketones, ur NEGATIVE NEGATIVE mg/dL   Protein, ur NEGATIVE NEGATIVE mg/dL   Nitrite NEGATIVE NEGATIVE   Leukocytes,Ua NEGATIVE NEGATIVE   RBC / HPF 0-5 0 - 5 RBC/hpf   WBC, UA 0-5 0 - 5 WBC/hpf   Bacteria, UA RARE (A) NONE SEEN   Squamous Epithelial / LPF 0-5 0 - 5   Mucus PRESENT   Surgical PCR screen      Status: None   Collection Time: 11/17/20  4:45 AM   Specimen: Nasal Mucosa; Nasal Swab  Result Value Ref Range   MRSA, PCR NEGATIVE NEGATIVE   Staphylococcus aureus NEGATIVE NEGATIVE  Glucose, capillary     Status: Abnormal   Collection Time: 11/17/20  7:23 AM  Result Value Ref Range   Glucose-Capillary 127 (H) 70 - 99 mg/dL  Glucose, capillary     Status: Abnormal   Collection Time: 11/17/20 11:36 AM  Result Value Ref Range   Glucose-Capillary 117 (H) 70 - 99 mg/dL    DG Shoulder Right  Result Date: 11/16/2020 CLINICAL DATA:  Fall/pain EXAM: RIGHT SHOULDER - 2+ VIEW COMPARISON:  None. FINDINGS: Degenerative changes in the [facility] joint with joint space narrowing and spurring. Glenohumeral joint is maintained. No acute bony abnormality. Specifically, no fracture, subluxation, or dislocation. Soft tissues are intact. IMPRESSION: Degenerative changes in the right AC joint. No acute bony abnormality. Electronically Signed   By: Charlett Nose M.D.   On: 11/16/2020 12:33   DG Hip Unilat W or Wo Pelvis 2-3 Views Right  Result Date: 11/16/2020 CLINICAL DATA:  Fall 1 week ago.  Right hip pain EXAM: DG HIP (WITH OR WITHOUT PELVIS) 2-3V RIGHT COMPARISON:  None. FINDINGS: There is a right femoral neck fracture with varus angulation. No subluxation or dislocation. Left hip unremarkable. SI joints symmetric and unremarkable. IMPRESSION: Right femoral neck fracture with varus angulation. Electronically Signed   By: Charlett Nose M.D.   On: 11/16/2020 12:33    Assessment/Plan:     Active Problems:   Hip fracture [facility])  Patient has a right femoral neck hip fracture which will require hemiarthroplasty.  Patient came in on anticoagulation and surgery will be delayed until Thursday as a result.  Patient will be on bedrest until surgery.  Continue current pain management.  Patient will be n.p.o. after midnight on Wednesday in preparation for surgery on Thursday.  Avoid any long-acting anticoagulant until after  surgery.    Juanell Fairly , MD 11/17/2020, 1:07 PM

## 2020-11-17 NOTE — Progress Notes (Signed)
PHARMACIST - PHYSICIAN COMMUNICATION  CONCERNING:  Enoxaparin (Lovenox) for DVT Prophylaxis    RECOMMENDATION: Patient was prescribed enoxaprin 40mg  q24 hours for VTE prophylaxis.   Filed Weights   11/17/20 1541  Weight: 40.8 kg (89 lb 15.2 oz)    Body mass index is 15.93 kg/m.  Estimated Creatinine Clearance: 30.8 mL/min (by C-G formula based on SCr of 0.86 mg/dL).   Patient is candidate for enoxaparin 30mg  every 24 hours based on CrCl <48ml/min or Weight <45kg  DESCRIPTION: Pharmacy has adjusted enoxaparin dose per [facility] policy.  Patient is now receiving enoxaparin 30 mg every 24 hours    31m, PharmD Clinical Pharmacist  11/17/2020 3:47 PM

## 2020-11-17 NOTE — Progress Notes (Signed)
OT Cancellation Note  Patient Details Name: Angelica Ferguson MRN: [id number] DOB: Jan 25, 1933   Cancelled Treatment:    Reason Eval/Treat Not Completed: Patient not medically ready. Consult received, chart reviewed. Pt pending hemiarthroplasty on Thursday, 7/28. Per RN, surgeon wants pt to be "bedrest until surgery." Will sign off. Please re-consult after surgery for initiation of therapy services as appropriate.   Wynona Canes, MPH, MS, OTR/L ascom [phone] 11/17/20, 12:04 PM

## 2020-11-17 NOTE — Progress Notes (Signed)
PROGRESS NOTE    Angelica Ferguson  OYD:[id number] DOB: 04-11-1933 DOA: 11/16/2020 PCP: Vonna Kotyk, NP   Brief Narrative:   87 y.o. female with a past medical history of persistent atrial fibrillation on edoxaban, coronary artery disease with PCI, hypertension, non-insulin-dependent diabetes mellitus type 2, GERD.  The patient is visiting family from [place].  She had a mechanical fall last Wednesday.  Has been unable to ambulate and is still having right hip pain since.  Therefore came to the emergency department for further evaluation of the right hip pain.  Hemodynamics are stable.  Plain film imaging shows a right femoral neck fracture with varus angulation.  Orthopedics has been contacted.  Patient took her anticoagulant edoxaban on the day of admission in the morning.  As such surgery will have to be delayed.  Surgery now scheduled for Thursday 7/28.  Orthopedics aware.  Recommending bedrest until OR.   Assessment & Plan:   Active Problems:   Hip fracture (HCC)  Right femoral neck fracture secondary to mechanical fall Patient presented 1 week after mechanical fall Had persistent and worsening pain Imaging significant for right femoral neck fracture with varus angulation Pain well controlled Orthopedics on consult Plan: Plan surgery on Thursday 7/28 Therapy evaluations on hold until operative fixation Holding home edoxaban SQ Lovenox for VT prophylaxis Ensure pain control  Persistent atrial fibrillation Holding home anticoagulation Continue home bisoprolol Continue home Cardizem As needed EKG for tachycardia or chest pain  Essential hypertension Home ramipril 10 mg daily  Non-insulin-dependent diabetes mellitus No contrast exposure anticipated Will continue home metformin 500 mg daily Sliding scale coverage Carb modified diet  Coronary artery disease Hyperlipidemia Resume high-dose statin Holding home doxepin  GERD Continue Prevacid 15 mg per home  dose  Leukocytosis Suspect reactive in nature No evidence of infection  DVT prophylaxis: SQ Lovenox Code Status: Full Family Communication: Daughter at bedside 7/26 Disposition Plan: Status is: Inpatient  Remains inpatient appropriate because:Inpatient level of care appropriate due to severity of illness  Dispo: The patient is from: Home              Anticipated d/c is to: SNF              Patient currently is not medically stable to d/c.   Difficult to place patient No       Level of care: Med-Surg  Consultants:  Orthopedics  Procedures:  None  Antimicrobials:  None   Subjective: Seen and examined.  Resting comfortably in bed.  No visible distress.  Answers all questions appropriately.  Objective: Vitals:   11/16/20 2000 11/17/20 0338 11/17/20 0722 11/17/20 1139  BP: 133/80 (!) 106/45 (!) 142/67 (!) 110/59  Pulse: 99 65 86 62  Resp: 17 19 18 18   Temp: 98.4 F (36.9 C) 98 F (36.7 C) 98.4 F (36.9 C) 97.9 F (36.6 C)  TempSrc: Oral     SpO2:  93% 95% 95%    Intake/Output Summary (Last 24 hours) at 11/17/2020 1326 Last data filed at 11/16/2020 2300 Gross per 24 hour  Intake 240 ml  Output --  Net 240 ml   There were no vitals filed for this visit.  Examination:  General exam: Appears calm and comfortable  Respiratory system: Clear to auscultation. Respiratory effort normal. Cardiovascular system: S1 & S2 heard, RRR. No JVD, murmurs, rubs, gallops or clicks. No pedal edema. Gastrointestinal system: Abdomen is nondistended, soft and nontender. No organomegaly or masses felt. Normal bowel sounds heard. Central nervous system:  Alert and oriented. No focal neurological deficits. Extremities: Right hip tender to palpation.  RLE shortened and externally rotated Skin: No rashes, lesions or ulcers Psychiatry: Judgement and insight appear normal. Mood & affect appropriate.     Data Reviewed: I have personally reviewed following labs and imaging  studies  CBC: Recent Labs  Lab 11/16/20 1434  WBC 13.8*  NEUTROABS 9.8*  HGB 11.9*  HCT 37.5  MCV 87.6  PLT 367   Basic Metabolic Panel: Recent Labs  Lab 11/16/20 1434  NA 134*  K 4.4  CL 101  CO2 24  GLUCOSE 111*  BUN 25*  CREATININE 0.86  CALCIUM 8.9   GFR: CrCl cannot be calculated (Unknown ideal weight.). Liver Function Tests: Recent Labs  Lab 11/16/20 1434  AST 27  ALT 21  ALKPHOS 104  BILITOT 1.0  PROT 6.5  ALBUMIN 3.1*   No results for input(s): LIPASE, AMYLASE in the last 168 hours. No results for input(s): AMMONIA in the last 168 hours. Coagulation Profile: Recent Labs  Lab 11/16/20 1434  INR 1.6*   Cardiac Enzymes: No results for input(s): CKTOTAL, CKMB, CKMBINDEX, TROPONINI in the last 168 hours. BNP (last 3 results) No results for input(s): PROBNP in the last 8760 hours. HbA1C: No results for input(s): HGBA1C in the last 72 hours. CBG: Recent Labs  Lab 11/17/20 0723 11/17/20 1136  GLUCAP 127* 117*   Lipid Profile: No results for input(s): CHOL, HDL, LDLCALC, TRIG, CHOLHDL, LDLDIRECT in the last 72 hours. Thyroid Function Tests: No results for input(s): TSH, T4TOTAL, FREET4, T3FREE, THYROIDAB in the last 72 hours. Anemia Panel: No results for input(s): VITAMINB12, FOLATE, FERRITIN, TIBC, IRON, RETICCTPCT in the last 72 hours. Sepsis Labs: No results for input(s): PROCALCITON, LATICACIDVEN in the last 168 hours.  Recent Results (from the past 240 hour(s))  Resp Panel by RT-PCR (Flu A&B, Covid) Nasopharyngeal Swab     Status: None   Collection Time: 11/16/20  2:34 PM   Specimen: Nasopharyngeal Swab; Nasopharyngeal(NP) swabs in vial transport medium  Result Value Ref Range Status   SARS Coronavirus 2 by RT PCR NEGATIVE NEGATIVE Final    Comment: (NOTE) SARS-CoV-2 target nucleic acids are NOT DETECTED.  The SARS-CoV-2 RNA is generally detectable in upper respiratory specimens during the acute phase of infection. The  lowest concentration of SARS-CoV-2 viral copies this assay can detect is 138 copies/mL. A negative result does not preclude SARS-Cov-2 infection and should not be used as the sole basis for treatment or other patient management decisions. A negative result may occur with  improper specimen collection/handling, submission of specimen other than nasopharyngeal swab, presence of viral mutation(s) within the areas targeted by this assay, and inadequate number of viral copies(<138 copies/mL). A negative result must be combined with clinical observations, patient history, and epidemiological information. The expected result is Negative.  Fact Sheet for Patients:  BloggerCourse.com  Fact Sheet for Healthcare Providers:  SeriousBroker.it  This test is no t yet approved or cleared by the [place] FDA and  has been authorized for detection and/or diagnosis of SARS-CoV-2 by FDA under an Emergency Use Authorization (EUA). This EUA will remain  in effect (meaning this test can be used) for the duration of the COVID-19 declaration under Section 564(b)(1) of the Act, 21 U.S.C.section 360bbb-3(b)(1), unless the authorization is terminated  or revoked sooner.       Influenza A by PCR NEGATIVE NEGATIVE Final   Influenza B by PCR NEGATIVE NEGATIVE Final    Comment: (NOTE)  The Xpert Xpress SARS-CoV-2/FLU/RSV plus assay is intended as an aid in the diagnosis of influenza from Nasopharyngeal swab specimens and should not be used as a sole basis for treatment. Nasal washings and aspirates are unacceptable for Xpert Xpress SARS-CoV-2/FLU/RSV testing.  Fact Sheet for Patients: BloggerCourse.com  Fact Sheet for Healthcare Providers: SeriousBroker.it  This test is not yet approved or cleared by the [place] FDA and has been authorized for detection and/or diagnosis of SARS-CoV-2 by FDA under  an Emergency Use Authorization (EUA). This EUA will remain in effect (meaning this test can be used) for the duration of the COVID-19 declaration under Section 564(b)(1) of the Act, 21 U.S.C. section 360bbb-3(b)(1), unless the authorization is terminated or revoked.  Performed at [facility], [address], [place], [place] [address]   Surgical PCR screen     Status: None   Collection Time: 11/17/20  4:45 AM   Specimen: Nasal Mucosa; Nasal Swab  Result Value Ref Range Status   MRSA, PCR NEGATIVE NEGATIVE Final   Staphylococcus aureus NEGATIVE NEGATIVE Final    Comment: (NOTE) The Xpert SA Assay (FDA approved for NASAL specimens in patients 31 years of age and older), is one component of a comprehensive surveillance program. It is not intended to diagnose infection nor to guide or monitor treatment. Performed at [facility], [address] [place], [place] [address]          Radiology Studies: DG Shoulder Right  Result Date: 11/16/2020 CLINICAL DATA:  Fall/pain EXAM: RIGHT SHOULDER - 2+ VIEW COMPARISON:  None. FINDINGS: Degenerative changes in the [facility] joint with joint space narrowing and spurring. Glenohumeral joint is maintained. No acute bony abnormality. Specifically, no fracture, subluxation, or dislocation. Soft tissues are intact. IMPRESSION: Degenerative changes in the right AC joint. No acute bony abnormality. Electronically Signed   By: Charlett Nose M.D.   On: 11/16/2020 12:33   DG Hip Unilat W or Wo Pelvis 2-3 Views Right  Result Date: 11/16/2020 CLINICAL DATA:  Fall 1 week ago.  Right hip pain EXAM: DG HIP (WITH OR WITHOUT PELVIS) 2-3V RIGHT COMPARISON:  None. FINDINGS: There is a right femoral neck fracture with varus angulation. No subluxation or dislocation. Left hip unremarkable. SI joints symmetric and unremarkable. IMPRESSION: Right femoral neck fracture with varus angulation. Electronically Signed   By: Charlett Nose M.D.   On: 11/16/2020  12:33        Scheduled Meds:  atorvastatin  80 mg Oral QPM   beclomethasone  1 puff Inhalation BID   bisoprolol  5 mg Oral BID   diltiazem  60 mg Oral BID   enoxaparin (LOVENOX) injection  40 mg Subcutaneous Q24H   Glycopyrrolate-Formoterol  2 puff Inhalation BID   insulin aspart  0-5 Units Subcutaneous QHS   insulin aspart  0-9 Units Subcutaneous TID WC   metFORMIN  500 mg Oral QPM   mupirocin ointment  1 application Nasal BID   pantoprazole  20 mg Oral Daily   ramipril  10 mg Oral Daily   Continuous Infusions:   ceFAZolin (ANCEF) IV       LOS: 1 day    Time spent: 25 minutes    Tresa Moore, MD [facility] Pager [phone]  If 7PM-7AM, please contact night-coverage 11/17/2020, 1:26 PM

## 2020-11-18 ENCOUNTER — Other Ambulatory Visit: Payer: Self-pay | Admitting: Orthopedic Surgery

## 2020-11-18 DIAGNOSIS — E440 Moderate protein-calorie malnutrition: Secondary | ICD-10-CM | POA: Insufficient documentation

## 2020-11-18 HISTORY — DX: Moderate protein-calorie malnutrition: E44.0

## 2020-11-18 LAB — HEMOGLOBIN A1C
Hgb A1c MFr Bld: 6.2 % — ABNORMAL HIGH (ref 4.8–5.6)
Mean Plasma Glucose: 131 mg/dL

## 2020-11-18 LAB — GLUCOSE, CAPILLARY
Glucose-Capillary: 129 mg/dL — ABNORMAL HIGH (ref 70–99)
Glucose-Capillary: 133 mg/dL — ABNORMAL HIGH (ref 70–99)
Glucose-Capillary: 134 mg/dL — ABNORMAL HIGH (ref 70–99)
Glucose-Capillary: 154 mg/dL — ABNORMAL HIGH (ref 70–99)

## 2020-11-18 LAB — ABO/RH: ABO/RH(D): O POS

## 2020-11-18 MED ORDER — ENSURE ENLIVE PO LIQD
237.0000 mL | Freq: Two times a day (BID) | ORAL | Status: DC
  Administered 2020-11-18 – 2020-11-22 (×5): 237 mL via ORAL

## 2020-11-18 NOTE — TOC Initial Note (Signed)
Transition of Care [facility]) - Initial/Assessment Note    Patient Details  Name: Angelica Ferguson MRN: [id number] Date of Birth: [birth year]  [facility] CM/SW Contact:    Su Hilt, RN Phone Number: 11/18/2020, 2:47 PM  Clinical Narrative:                    Met with the patient and her Daughter in the room, she is on Blood thinner so her surgery is not scheduled until Thursday, she is from [place] and therefore she is a self pay as a result of the national insurance in [place] doe snot cover services here, after Surgery PT will evaluate and we will determine needs at that time, her daughter is very supportive     Patient Goals and CMS Choice        Expected Discharge Plan and Services                                                Prior Living Arrangements/Services                       Activities of Daily Living Home Assistive Devices/Equipment: Hearing aid ADL Screening (condition at time of admission) Patient's cognitive ability adequate to safely complete daily activities?: Yes Is the patient deaf or have difficulty hearing?: Yes Does the patient have difficulty seeing, even when wearing glasses/contacts?: No Does the patient have difficulty concentrating, remembering, or making decisions?: No Patient able to express need for assistance with ADLs?: Yes Does the patient have difficulty dressing or bathing?: Yes Independently performs ADLs?: Yes (appropriate for developmental age) Does the patient have difficulty walking or climbing stairs?: Yes Weakness of Legs: Right Weakness of Arms/Hands: None  Permission Sought/Granted                  Emotional Assessment              Admission diagnosis:  Hip fracture ([place]) [S72.009A] Closed fracture of neck of right femur, initial encounter ([place]) [S72.001A] Fall, initial encounter [W19.XXXA] Patient Active Problem List   Diagnosis Date Noted   Hip fracture ([place])  11/16/2020   PCP:  Finis Bud, NP Pharmacy:   [facility] [phone] - [address], [place] AT [address] [place] [address] Phone: [phone] Fax: [phone]     Social Determinants of Health (SDOH) Interventions    Readmission Risk Interventions No flowsheet data found.

## 2020-11-18 NOTE — Progress Notes (Signed)
PROGRESS NOTE    Angelica Ferguson  WNI:[id number] DOB: Apr 10, 1933 DOA: 11/16/2020 PCP: Vonna Kotyk, NP   Brief Narrative:   87 y.o. female with a past medical history of persistent atrial fibrillation on edoxaban, coronary artery disease with PCI, hypertension, non-insulin-dependent diabetes mellitus type 2, GERD.  The patient is visiting family from [place].  She had a mechanical fall last Wednesday.  Has been unable to ambulate and is still having right hip pain since.  Therefore came to the emergency department for further evaluation of the right hip pain.  Hemodynamics are stable.  Plain film imaging shows a right femoral neck fracture with varus angulation.  Orthopedics has been contacted.  Patient took her anticoagulant edoxaban on the day of admission in the morning.  As such surgery will have to be delayed.  Surgery now scheduled for Thursday 7/28.  Orthopedics aware.  Recommending bedrest until OR.   Assessment & Plan:   Active Problems:   Hip fracture (HCC)   Malnutrition of moderate degree  Right femoral neck fracture secondary to mechanical fall Patient presented 1 week after mechanical fall Had persistent and worsening pain Imaging significant for right femoral neck fracture with varus angulation Pain well controlled Orthopedics on consult Plan: Okay for diet today N.p.o. after midnight Check a.m. CBC, BMP, mag, INR Therapy evaluations on hold until operative fixation Holding home edoxaban SQ Lovenox for VT prophylaxis, hold tomorrow's dose Ensure pain control  Persistent atrial fibrillation Holding home anticoagulation Continue home bisoprolol Continue home Cardizem As needed EKG for tachycardia or chest pain  Essential hypertension Home ramipril 10 mg daily , Hold a.m. dose tomorrow  Non-insulin-dependent diabetes mellitus No contrast exposure anticipated Metformin is been discontinued to avoid hypoglycemia Sliding scale coverage Carb modified  diet  Coronary artery disease Hyperlipidemia Resume high-dose statin Holding home doxepin  GERD Continue Prevacid 15 mg per home dose  Leukocytosis Suspect reactive in nature No evidence of infection  DVT prophylaxis: SQ Lovenox Code Status: Full Family Communication: Daughter at bedside 7/27 Disposition Plan: Status is: Inpatient  Remains inpatient appropriate because:Inpatient level of care appropriate due to severity of illness  Dispo: The patient is from: Home              Anticipated d/c is to: SNF              Patient currently is not medically stable to d/c.   Difficult to place patient No   Plan for operative fixation of hip fracture 7/28    Level of care: Med-Surg  Consultants:  Orthopedics  Procedures:  None  Antimicrobials:  None   Subjective: Seen and examined.  Resting comfortably in bed.  No visible distress.  No pain complaints.  Answers all questions appropriately.  Daughter at bedside.  Objective: Vitals:   11/17/20 2021 11/17/20 2343 11/18/20 0411 11/18/20 0829  BP: 133/79 109/60 120/63 (!) 123/99  Pulse: 85 81 68 (!) 49  Resp: 17 18 16 17   Temp: 98.3 F (36.8 C) 98.4 F (36.9 C) 98.3 F (36.8 C) 97.8 F (36.6 C)  TempSrc:      SpO2: 91% 91% 96% 91%  Weight:      Height:        Intake/Output Summary (Last 24 hours) at 11/18/2020 1532 Last data filed at 11/18/2020 1036 Gross per 24 hour  Intake 120 ml  Output 700 ml  Net -580 ml   Filed Weights   11/17/20 1541  Weight: 40.8 kg    Examination:  General exam: No acute distress Respiratory system: Clear to auscultation. Respiratory effort normal. Cardiovascular system: S1-S2, regular rate and rhythm, no murmurs, no pedal edema  gastrointestinal system: Abdomen is nondistended, soft and nontender. No organomegaly or masses felt. Normal bowel sounds heard. Central nervous system: Alert and oriented. No focal neurological deficits. Extremities: Right hip tender to palpation.   RLE shortened and externally rotated Skin: No rashes, lesions or ulcers Psychiatry: Judgement and insight appear normal. Mood & affect appropriate.     Data Reviewed: I have personally reviewed following labs and imaging studies  CBC: Recent Labs  Lab 11/16/20 1434  WBC 13.8*  NEUTROABS 9.8*  HGB 11.9*  HCT 37.5  MCV 87.6  PLT 367   Basic Metabolic Panel: Recent Labs  Lab 11/16/20 1434  NA 134*  K 4.4  CL 101  CO2 24  GLUCOSE 111*  BUN 25*  CREATININE 0.86  CALCIUM 8.9   GFR: Estimated Creatinine Clearance: 30.8 mL/min (by C-G formula based on SCr of 0.86 mg/dL). Liver Function Tests: Recent Labs  Lab 11/16/20 1434  AST 27  ALT 21  ALKPHOS 104  BILITOT 1.0  PROT 6.5  ALBUMIN 3.1*   No results for input(s): LIPASE, AMYLASE in the last 168 hours. No results for input(s): AMMONIA in the last 168 hours. Coagulation Profile: Recent Labs  Lab 11/16/20 1434  INR 1.6*   Cardiac Enzymes: No results for input(s): CKTOTAL, CKMB, CKMBINDEX, TROPONINI in the last 168 hours. BNP (last 3 results) No results for input(s): PROBNP in the last 8760 hours. HbA1C: Recent Labs    11/16/20 1432  HGBA1C 6.2*   CBG: Recent Labs  Lab 11/17/20 1640 11/17/20 2124 11/18/20 0739 11/18/20 1207 11/18/20 1214  GLUCAP 120* 139* 129* 134* 133*   Lipid Profile: No results for input(s): CHOL, HDL, LDLCALC, TRIG, CHOLHDL, LDLDIRECT in the last 72 hours. Thyroid Function Tests: No results for input(s): TSH, T4TOTAL, FREET4, T3FREE, THYROIDAB in the last 72 hours. Anemia Panel: No results for input(s): VITAMINB12, FOLATE, FERRITIN, TIBC, IRON, RETICCTPCT in the last 72 hours. Sepsis Labs: No results for input(s): PROCALCITON, LATICACIDVEN in the last 168 hours.  Recent Results (from the past 240 hour(s))  Resp Panel by RT-PCR (Flu A&B, Covid) Nasopharyngeal Swab     Status: None   Collection Time: 11/16/20  2:34 PM   Specimen: Nasopharyngeal Swab; Nasopharyngeal(NP) swabs  in vial transport medium  Result Value Ref Range Status   SARS Coronavirus 2 by RT PCR NEGATIVE NEGATIVE Final    Comment: (NOTE) SARS-CoV-2 target nucleic acids are NOT DETECTED.  The SARS-CoV-2 RNA is generally detectable in upper respiratory specimens during the acute phase of infection. The lowest concentration of SARS-CoV-2 viral copies this assay can detect is 138 copies/mL. A negative result does not preclude SARS-Cov-2 infection and should not be used as the sole basis for treatment or other patient management decisions. A negative result may occur with  improper specimen collection/handling, submission of specimen other than nasopharyngeal swab, presence of viral mutation(s) within the areas targeted by this assay, and inadequate number of viral copies(<138 copies/mL). A negative result must be combined with clinical observations, patient history, and epidemiological information. The expected result is Negative.  Fact Sheet for Patients:  BloggerCourse.com  Fact Sheet for Healthcare Providers:  SeriousBroker.it  This test is no t yet approved or cleared by the [place] FDA and  has been authorized for detection and/or diagnosis of SARS-CoV-2 by FDA under an Emergency Use Authorization (EUA). This  EUA will remain  in effect (meaning this test can be used) for the duration of the COVID-19 declaration under Section 564(b)(1) of the Act, 21 U.S.C.section 360bbb-3(b)(1), unless the authorization is terminated  or revoked sooner.       Influenza A by PCR NEGATIVE NEGATIVE Final   Influenza B by PCR NEGATIVE NEGATIVE Final    Comment: (NOTE) The Xpert Xpress SARS-CoV-2/FLU/RSV plus assay is intended as an aid in the diagnosis of influenza from Nasopharyngeal swab specimens and should not be used as a sole basis for treatment. Nasal washings and aspirates are unacceptable for Xpert Xpress  SARS-CoV-2/FLU/RSV testing.  Fact Sheet for Patients: BloggerCourse.com  Fact Sheet for Healthcare Providers: SeriousBroker.it  This test is not yet approved or cleared by the [place] FDA and has been authorized for detection and/or diagnosis of SARS-CoV-2 by FDA under an Emergency Use Authorization (EUA). This EUA will remain in effect (meaning this test can be used) for the duration of the COVID-19 declaration under Section 564(b)(1) of the Act, 21 U.S.C. section 360bbb-3(b)(1), unless the authorization is terminated or revoked.  Performed at [facility], [address], [place], [place] [address]   Surgical PCR screen     Status: None   Collection Time: 11/17/20  4:45 AM   Specimen: Nasal Mucosa; Nasal Swab  Result Value Ref Range Status   MRSA, PCR NEGATIVE NEGATIVE Final   Staphylococcus aureus NEGATIVE NEGATIVE Final    Comment: (NOTE) The Xpert SA Assay (FDA approved for NASAL specimens in patients 96 years of age and older), is one component of a comprehensive surveillance program. It is not intended to diagnose infection nor to guide or monitor treatment. Performed at [facility], [address] [place], [place] [address]          Radiology Studies: No results found.      Scheduled Meds:  atorvastatin  80 mg Oral QPM   beclomethasone  1 puff Inhalation BID   bisoprolol  5 mg Oral BID   diltiazem  60 mg Oral BID   enoxaparin (LOVENOX) injection  30 mg Subcutaneous Q24H   feeding supplement  237 mL Oral BID BM   Glycopyrrolate-Formoterol  2 puff Inhalation BID   insulin aspart  0-5 Units Subcutaneous QHS   insulin aspart  0-9 Units Subcutaneous TID WC   mupirocin ointment  1 application Nasal BID   pantoprazole  20 mg Oral Daily   ramipril  10 mg Oral Daily   Continuous Infusions:   ceFAZolin (ANCEF) IV       LOS: 2 days    Time spent: 25 minutes    Tresa Moore, MD [facility] Pager [phone]  If 7PM-7AM, please contact night-coverage 11/18/2020, 3:32 PM

## 2020-11-18 NOTE — Progress Notes (Signed)
Initial Nutrition Assessment  DOCUMENTATION CODES:  Non-severe (moderate) malnutrition in context of social or environmental circumstances, Underweight  INTERVENTION:  Liberalize diet to carb consistent to allow for more food options in the setting of advanced age and low BMI Ensure Enlive po BID, each supplement provides 350 kcal and 20 grams of protein Request new measured weight to assess for changes  NUTRITION DIAGNOSIS:  Moderate Malnutrition related to social / environmental circumstances as evidenced by moderate fat depletion, severe muscle depletion, mild fat depletion, moderate muscle depletion.  GOAL:  Patient will meet greater than or equal to 90% of their needs, Weight gain  MONITOR:  PO intake, Supplement acceptance, Skin, Labs, Weight trends  REASON FOR ASSESSMENT:   (Underweight BMI)    ASSESSMENT:  87 y.o. female with PMH of atrial fibrillation, CAD, HTN, DM type 2, and GERD who presented to ED with ongoing right hip pain after a fall last week. Imaging in ED showed a right femoral neck fracture  Pt took her anticoagulation medicine the day of admission. Surgical repair is scheduled for 7/28, pt ordered for bed rest until after procedure.   Pt resting in bed at the time of assessment. Pt reports that so far her appetite has been pretty good and she feels she is eating at baseline. States that she is going to try and eat well and get good rest tonight in anticipation for her surgery tomorrow. Has never had a nutrition supplement, but is agreeable to receiving   Pt reports several falls over the last few months and illnesses since she has been in the [place] visiting her daughter. Pt reports that she lost weight after she had pneumonia a few months ago but felt she has maintained her weight since that time. Typically active and independent at baseline.     Average Meal Intake: 7/25-7/27: 80% intake x 1 recorded meal  Nutritionally Relevant Medications: Scheduled Meds:   atorvastatin  80 mg Oral QPM   insulin aspart  0-5 Units Subcutaneous QHS   insulin aspart  0-9 Units Subcutaneous TID WC   metFORMIN  500 mg Oral QPM   pantoprazole  20 mg Oral Daily   Continuous Infusions:   ceFAZolin (ANCEF) IV     PRN Meds: ondansetron, polyethylene glycol  Labs Reviewed: SBG ranges from 117-139 mg/dL over the last 24 hours HgbA1c 6.2% (7/25)  NUTRITION - FOCUSED PHYSICAL EXAM: Flowsheet Row Most Recent Value  Orbital Region Mild depletion  Upper Arm Region Mild depletion  Thoracic and Lumbar Region Moderate depletion  Buccal Region Mild depletion  Temple Region Mild depletion  Clavicle Bone Region Moderate depletion  Clavicle and Acromion Bone Region Severe depletion  Scapular Bone Region Severe depletion  Dorsal Hand Moderate depletion  Patellar Region Severe depletion  Anterior Thigh Region Severe depletion  Posterior Calf Region Severe depletion  Edema (RD Assessment) None  Hair Reviewed  Eyes Reviewed  Mouth Reviewed  Skin Reviewed  Nails Reviewed   Diet Order:   Diet Order             Diet NPO time specified  Diet effective midnight           Diet Carb Modified Fluid consistency: Thin; Room service appropriate? Yes  Diet effective now                   EDUCATION NEEDS:  No education needs have been identified at this time  Skin:  Skin Assessment: Reviewed RN Assessment  Last BM:  7/24  Height:  Ht Readings from Last 1 Encounters:  11/17/20 5\' 3"  (1.6 m)   Weight:  Wt Readings from Last 1 Encounters:  11/17/20 40.8 kg    Ideal Body Weight:  52.3 kg  BMI:  Body mass index is 15.93 kg/m.  Estimated Nutritional Needs:  Kcal:  1400-1600 kcal/d Protein:  75-85g/d Fluid:  >1519mL/d   80m, RD, LDN Clinical Dietitian Pager on Amion

## 2020-11-19 ENCOUNTER — Inpatient Hospital Stay: Payer: Medicaid Other

## 2020-11-19 ENCOUNTER — Encounter
Admission: EM | Disposition: A | Discharge status: Rehabilitation Facility | Payer: Self-pay | Source: Home / Self Care | Attending: Internal Medicine

## 2020-11-19 HISTORY — PX: HIP ARTHROPLASTY: SHX981

## 2020-11-19 LAB — CBC WITH DIFFERENTIAL/PLATELET
Abs Immature Granulocytes: 0.1 10*3/uL — ABNORMAL HIGH (ref 0.00–0.07)
Basophils Absolute: 0.1 10*3/uL (ref 0.0–0.1)
Basophils Relative: 1 %
Eosinophils Absolute: 0.2 10*3/uL (ref 0.0–0.5)
Eosinophils Relative: 2 %
HCT: 32.7 % — ABNORMAL LOW (ref 36.0–46.0)
Hemoglobin: 10.4 g/dL — ABNORMAL LOW (ref 12.0–15.0)
Immature Granulocytes: 1 %
Lymphocytes Relative: 15 %
Lymphs Abs: 1.9 10*3/uL (ref 0.7–4.0)
MCH: 27.8 pg (ref 26.0–34.0)
MCHC: 31.8 g/dL (ref 30.0–36.0)
MCV: 87.4 fL (ref 80.0–100.0)
Monocytes Absolute: 1.2 10*3/uL — ABNORMAL HIGH (ref 0.1–1.0)
Monocytes Relative: 10 %
Neutro Abs: 9 10*3/uL — ABNORMAL HIGH (ref 1.7–7.7)
Neutrophils Relative %: 71 %
Platelets: 400 10*3/uL (ref 150–400)
RBC: 3.74 MIL/uL — ABNORMAL LOW (ref 3.87–5.11)
RDW: 15.5 % (ref 11.5–15.5)
WBC: 12.5 10*3/uL — ABNORMAL HIGH (ref 4.0–10.5)
nRBC: 0 % (ref 0.0–0.2)

## 2020-11-19 LAB — BASIC METABOLIC PANEL
Anion gap: 9 (ref 5–15)
BUN: 45 mg/dL — ABNORMAL HIGH (ref 8–23)
CO2: 28 mmol/L (ref 22–32)
Calcium: 8.9 mg/dL (ref 8.9–10.3)
Chloride: 98 mmol/L (ref 98–111)
Creatinine, Ser: 1.04 mg/dL — ABNORMAL HIGH (ref 0.44–1.00)
GFR, Estimated: 53 mL/min — ABNORMAL LOW (ref >60)
Glucose, Bld: 121 mg/dL — ABNORMAL HIGH (ref 70–99)
Potassium: 4.8 mmol/L (ref 3.5–5.1)
Sodium: 135 mmol/L (ref 135–145)

## 2020-11-19 LAB — MAGNESIUM: Magnesium: 1.9 mg/dL (ref 1.7–2.4)

## 2020-11-19 LAB — GLUCOSE, CAPILLARY
Glucose-Capillary: 133 mg/dL — ABNORMAL HIGH (ref 70–99)
Glucose-Capillary: 154 mg/dL — ABNORMAL HIGH (ref 70–99)
Glucose-Capillary: 111 mg/dL — ABNORMAL HIGH (ref 70–99)

## 2020-11-19 LAB — PROTIME-INR
INR: 1.1 (ref 0.8–1.2)
Prothrombin Time: 13.9 seconds (ref 11.4–15.2)

## 2020-11-19 SURGERY — HEMIARTHROPLASTY, HIP, DIRECT ANTERIOR APPROACH, FOR FRACTURE
Anesthesia: Spinal | Site: Hip | Laterality: Right

## 2020-11-19 MED ORDER — ONDANSETRON HCL 4 MG/2ML IJ SOLN
4.0000 mg | Freq: Four times a day (QID) | INTRAMUSCULAR | Status: DC | PRN
  Administered 2020-11-19 – 2020-11-20 (×2): 4 mg via INTRAVENOUS
  Filled 2020-11-19 (×2): qty 2

## 2020-11-19 MED ORDER — ALBUTEROL SULFATE (2.5 MG/3ML) 0.083% IN NEBU
INHALATION_SOLUTION | RESPIRATORY_TRACT | Status: AC
  Filled 2020-11-19: qty 3

## 2020-11-19 MED ORDER — SODIUM CHLORIDE 0.9 % IV SOLN: 75.0000 mL/h | INTRAVENOUS | Status: DC

## 2020-11-19 MED ORDER — MORPHINE SULFATE (PF) 2 MG/ML IV SOLN
0.5000 mg | INTRAVENOUS | Status: DC | PRN
  Administered 2020-11-19 – 2020-11-23 (×3): 1 mg via INTRAVENOUS
  Filled 2020-11-19 (×3): qty 1

## 2020-11-19 MED ORDER — HYDROCODONE-ACETAMINOPHEN 5-325 MG PO TABS
1.0000 | ORAL_TABLET | ORAL | Status: DC | PRN
  Administered 2020-11-19: 2 via ORAL
  Administered 2020-11-20 – 2020-11-21 (×3): 1 via ORAL
  Administered 2020-11-22: 2 via ORAL
  Administered 2020-11-23: 1 via ORAL
  Filled 2020-11-19: qty 1
  Filled 2020-11-19: qty 2
  Filled 2020-11-19: qty 1
  Filled 2020-11-19: qty 2
  Filled 2020-11-19 (×2): qty 1

## 2020-11-19 MED ORDER — ALUM & MAG HYDROXIDE-SIMETH 200-200-20 MG/5ML PO SUSP: 30.0000 mL | ORAL | Status: DC | PRN

## 2020-11-19 MED ORDER — OXYCODONE HCL 5 MG/5ML PO SOLN: 5.0000 mg | Freq: Once | ORAL | Status: DC | PRN

## 2020-11-19 MED ORDER — EDOXABAN TOSYLATE 30 MG PO TABS
30.0000 mg | ORAL_TABLET | Freq: Every day | ORAL | Status: DC
  Administered 2020-11-20 – 2020-11-23 (×4): 30 mg via ORAL
  Filled 2020-11-19 (×4): qty 1

## 2020-11-19 MED ORDER — METHOCARBAMOL 500 MG PO TABS
500.0000 mg | ORAL_TABLET | Freq: Four times a day (QID) | ORAL | Status: DC | PRN
  Administered 2020-11-19: 500 mg via ORAL
  Filled 2020-11-19: qty 1

## 2020-11-19 MED ORDER — FLEET ENEMA 7-19 GM/118ML RE ENEM: 1.0000 | ENEMA | Freq: Once | RECTAL | Status: DC | PRN

## 2020-11-19 MED ORDER — CEFAZOLIN SODIUM-DEXTROSE 1-4 GM/50ML-% IV SOLN
1.0000 g | Freq: Four times a day (QID) | INTRAVENOUS | Status: AC
  Administered 2020-11-19 (×2): 1 g via INTRAVENOUS
  Filled 2020-11-19 (×2): qty 50

## 2020-11-19 MED ORDER — ALBUTEROL SULFATE (2.5 MG/3ML) 0.083% IN NEBU
2.5000 mg | INHALATION_SOLUTION | Freq: Once | RESPIRATORY_TRACT | Status: AC
  Administered 2020-11-19: 2.5 mg via RESPIRATORY_TRACT
  Filled 2020-11-19: qty 3

## 2020-11-19 MED ORDER — DEXTROSE 5 % IV SOLN
500.0000 mg | Freq: Four times a day (QID) | INTRAVENOUS | Status: DC | PRN
  Filled 2020-11-19: qty 5

## 2020-11-19 MED ORDER — PROPOFOL 10 MG/ML IV BOLUS
INTRAVENOUS | Status: AC
  Filled 2020-11-19: qty 20

## 2020-11-19 MED ORDER — CHLORHEXIDINE GLUCONATE CLOTH 2 % EX PADS
6.0000 | MEDICATED_PAD | Freq: Every day | CUTANEOUS | Status: DC
  Administered 2020-11-20 – 2020-11-23 (×4): 6 via TOPICAL

## 2020-11-19 MED ORDER — BISACODYL 10 MG RE SUPP: 10.0000 mg | Freq: Every day | RECTAL | Status: DC | PRN

## 2020-11-19 MED ORDER — ACETAMINOPHEN 10 MG/ML IV SOLN: 1000.0000 mg | Freq: Once | INTRAVENOUS | Status: DC | PRN

## 2020-11-19 MED ORDER — SENNA 8.6 MG PO TABS
1.0000 | ORAL_TABLET | Freq: Two times a day (BID) | ORAL | Status: DC
  Administered 2020-11-19 – 2020-11-20 (×3): 8.6 mg via ORAL
  Filled 2020-11-19 (×4): qty 1

## 2020-11-19 MED ORDER — PROPOFOL 10 MG/ML IV BOLUS
INTRAVENOUS | Status: DC | PRN
  Administered 2020-11-19: 25 mg via INTRAVENOUS

## 2020-11-19 MED ORDER — PROPOFOL 500 MG/50ML IV EMUL
INTRAVENOUS | Status: DC | PRN
  Administered 2020-11-19: 25 ug/kg/min via INTRAVENOUS

## 2020-11-19 MED ORDER — KETAMINE HCL 50 MG/ML IJ SOLN
INTRAMUSCULAR | Status: AC
  Filled 2020-11-19: qty 1

## 2020-11-19 MED ORDER — ONDANSETRON HCL 4 MG PO TABS: 4.0000 mg | ORAL_TABLET | Freq: Four times a day (QID) | ORAL | Status: DC | PRN

## 2020-11-19 MED ORDER — BUPIVACAINE HCL (PF) 0.5 % IJ SOLN
INTRAMUSCULAR | Status: AC
  Filled 2020-11-19: qty 10

## 2020-11-19 MED ORDER — NEOMYCIN-POLYMYXIN B GU 40-200000 IR SOLN
Status: DC | PRN
  Administered 2020-11-19: 4 mL
  Administered 2020-11-19: 12 mL

## 2020-11-19 MED ORDER — SODIUM CHLORIDE 0.9 % IR SOLN
Status: DC | PRN
  Administered 2020-11-19: 3000 mL

## 2020-11-19 MED ORDER — PHENYLEPHRINE HCL (PRESSORS) 10 MG/ML IV SOLN
INTRAVENOUS | Status: DC | PRN
  Administered 2020-11-19 (×2): 100 ug via INTRAVENOUS

## 2020-11-19 MED ORDER — DOCUSATE SODIUM 100 MG PO CAPS
100.0000 mg | ORAL_CAPSULE | Freq: Two times a day (BID) | ORAL | Status: DC
  Administered 2020-11-19 – 2020-11-20 (×3): 100 mg via ORAL
  Filled 2020-11-19 (×4): qty 1

## 2020-11-19 MED ORDER — DROPERIDOL 2.5 MG/ML IJ SOLN
0.6250 mg | Freq: Once | INTRAMUSCULAR | Status: DC | PRN
  Filled 2020-11-19: qty 2

## 2020-11-19 MED ORDER — SODIUM CHLORIDE 0.9 % IV SOLN: INTRAVENOUS | Status: DC | PRN

## 2020-11-19 MED ORDER — ACETAMINOPHEN 10 MG/ML IV SOLN
INTRAVENOUS | Status: AC
  Filled 2020-11-19: qty 100

## 2020-11-19 MED ORDER — 0.9 % SODIUM CHLORIDE (POUR BTL) OPTIME
TOPICAL | Status: DC | PRN
  Administered 2020-11-19: 780 mL

## 2020-11-19 MED ORDER — ACETAMINOPHEN 500 MG PO TABS
500.0000 mg | ORAL_TABLET | Freq: Four times a day (QID) | ORAL | Status: AC
  Administered 2020-11-19 – 2020-11-20 (×4): 500 mg via ORAL
  Filled 2020-11-19 (×4): qty 1

## 2020-11-19 MED ORDER — FENTANYL CITRATE (PF) 100 MCG/2ML IJ SOLN: 25.0000 ug | INTRAMUSCULAR | Status: DC | PRN

## 2020-11-19 MED ORDER — ACETAMINOPHEN 10 MG/ML IV SOLN
INTRAVENOUS | Status: DC | PRN
  Administered 2020-11-19: 1000 mg via INTRAVENOUS

## 2020-11-19 MED ORDER — IPRATROPIUM-ALBUTEROL 0.5-2.5 (3) MG/3ML IN SOLN
RESPIRATORY_TRACT | Status: AC
  Filled 2020-11-19: qty 3

## 2020-11-19 MED ORDER — TRAMADOL HCL 50 MG PO TABS
100.0000 mg | ORAL_TABLET | Freq: Two times a day (BID) | ORAL | Status: DC
  Administered 2020-11-19 – 2020-11-20 (×2): 100 mg via ORAL
  Filled 2020-11-19 (×2): qty 2

## 2020-11-19 MED ORDER — OXYCODONE HCL 5 MG PO TABS: 5.0000 mg | ORAL_TABLET | Freq: Once | ORAL | Status: DC | PRN

## 2020-11-19 MED ORDER — KETAMINE HCL 50 MG/ML IJ SOLN
INTRAMUSCULAR | Status: DC | PRN
  Administered 2020-11-19: 25 mg via INTRAVENOUS

## 2020-11-19 MED ORDER — METOPROLOL TARTRATE 5 MG/5ML IV SOLN
INTRAVENOUS | Status: AC
  Filled 2020-11-19: qty 5

## 2020-11-19 MED ORDER — CEFAZOLIN SODIUM-DEXTROSE 2-4 GM/100ML-% IV SOLN
INTRAVENOUS | Status: AC
  Filled 2020-11-19: qty 100

## 2020-11-19 MED ORDER — BUPIVACAINE HCL (PF) 0.5 % IJ SOLN
INTRAMUSCULAR | Status: DC | PRN
  Administered 2020-11-19: 2.2 mL via INTRATHECAL

## 2020-11-19 MED ORDER — METOPROLOL TARTRATE 5 MG/5ML IV SOLN
INTRAVENOUS | Status: DC | PRN
  Administered 2020-11-19 (×2): 1 mg via INTRAVENOUS

## 2020-11-19 MED ORDER — PROMETHAZINE HCL 25 MG/ML IJ SOLN
6.2500 mg | INTRAMUSCULAR | Status: DC | PRN
Start: 2020-11-19 — End: 2020-11-19

## 2020-11-19 SURGICAL SUPPLY — 57 items
BLADE SAGITTAL WIDE XTHICK NO (BLADE) ×2 IMPLANT
BLADE SURG SZ10 CARB STEEL (BLADE) ×2 IMPLANT
BNDG COHESIVE 4X5 TAN STRL (GAUZE/BANDAGES/DRESSINGS) ×2 IMPLANT
CANISTER SUCT 1200ML W/VALVE (MISCELLANEOUS) ×2 IMPLANT
CATH FOLEY SIL 2WAY 14FR5CC (CATHETERS) ×2 IMPLANT
COVER BACK TABLE REUSABLE LG (DRAPES) ×2 IMPLANT
DRAPE 3/4 80X56 (DRAPES) ×4 IMPLANT
DRAPE INCISE IOBAN 66X60 STRL (DRAPES) ×2 IMPLANT
DRAPE ORTHO SPLIT 77X108 STRL (DRAPES) ×4
DRAPE SURG 17X11 SM STRL (DRAPES) ×2 IMPLANT
DRAPE SURG ORHT 6 SPLT 77X108 (DRAPES) ×2 IMPLANT
DRSG OPSITE POSTOP 4X10 (GAUZE/BANDAGES/DRESSINGS) ×2 IMPLANT
DURAPREP 26ML APPLICATOR (WOUND CARE) ×8 IMPLANT
ELECT BLADE 6.5 EXT (BLADE) ×2 IMPLANT
ELECT CAUTERY BLADE 6.4 (BLADE) ×2 IMPLANT
ELECT REM PT RETURN 9FT ADLT (ELECTROSURGICAL) ×2
ELECTRODE REM PT RTRN 9FT ADLT (ELECTROSURGICAL) ×1 IMPLANT
GAUZE 4X4 16PLY ~~LOC~~+RFID DBL (SPONGE) ×2 IMPLANT
GAUZE SPONGE 4X4 12PLY STRL (GAUZE/BANDAGES/DRESSINGS) ×2 IMPLANT
GAUZE XEROFORM 1X8 LF (GAUZE/BANDAGES/DRESSINGS) ×4 IMPLANT
GLOVE SURG ORTHO LTX SZ9 (GLOVE) ×4 IMPLANT
GLOVE SURG UNDER POLY LF SZ9 (GLOVE) ×2 IMPLANT
GOWN STRL REUS TWL 2XL XL LVL4 (GOWN DISPOSABLE) ×2 IMPLANT
GOWN STRL REUS W/ TWL LRG LVL3 (GOWN DISPOSABLE) ×1 IMPLANT
GOWN STRL REUS W/TWL LRG LVL3 (GOWN DISPOSABLE) ×2
HEAD MODULAR ENDO (Orthopedic Implant) ×2 IMPLANT
HEAD UNPLR 47XMDLR STRL HIP (Orthopedic Implant) ×1 IMPLANT
HEMOVAC 400ML (MISCELLANEOUS) ×2 IMPLANT
HOLDER FOLEY CATH W/STRAP (MISCELLANEOUS) ×2 IMPLANT
IV NS IRRIG 3000ML ARTHROMATIC (IV SOLUTION) ×2 IMPLANT
KIT DRAIN HEMOVAC JP 7FR 400ML (MISCELLANEOUS) ×1 IMPLANT
KIT TURNOVER KIT A (KITS) ×2 IMPLANT
MANIFOLD NEPTUNE II (INSTRUMENTS) ×2 IMPLANT
NDL SAFETY ECLIPSE 18X1.5 (NEEDLE) ×1 IMPLANT
NEEDLE FILTER BLUNT 18X 1/2SAF (NEEDLE) ×1
NEEDLE FILTER BLUNT 18X1 1/2 (NEEDLE) ×1 IMPLANT
NEEDLE HYPO 18GX1.5 SHARP (NEEDLE) ×2
NEEDLE MAYO CATGUT SZ4 (NEEDLE) ×2 IMPLANT
NS IRRIG 1000ML POUR BTL (IV SOLUTION) ×2 IMPLANT
PACK HIP PROSTHESIS (MISCELLANEOUS) ×2 IMPLANT
PILLOW ABDUCTION FOAM SM (MISCELLANEOUS) ×2 IMPLANT
PULSAVAC PLUS IRRIG FAN TIP (DISPOSABLE) ×2
RETRIEVER SUT HEWSON (MISCELLANEOUS) ×2 IMPLANT
SLEEVE UNITRAX (Orthopedic Implant) ×2 IMPLANT
SPONGE T-LAP 18X18 ~~LOC~~+RFID (SPONGE) ×8 IMPLANT
STAPLER SKIN PROX 35W (STAPLE) ×2 IMPLANT
STEM FEM ACCOLADE 38X102X30 S3 (Stem) ×2 IMPLANT
SUT TICRON 2-0 30IN 311381 (SUTURE) ×8 IMPLANT
SUT VIC AB 0 CT1 36 (SUTURE) ×4 IMPLANT
SUT VIC AB 2-0 CT2 27 (SUTURE) ×4 IMPLANT
SYR 10ML LL (SYRINGE) ×2 IMPLANT
TAPE MICROFOAM 4IN (TAPE) ×2 IMPLANT
TAPE TRANSPORE STRL 2 31045 (GAUZE/BANDAGES/DRESSINGS) ×2 IMPLANT
TIP BRUSH PULSAVAC PLUS 24.33 (MISCELLANEOUS) ×2 IMPLANT
TIP FAN IRRIG PULSAVAC PLUS (DISPOSABLE) ×1 IMPLANT
TRAY FOLEY SLVR 16FR LF STAT (SET/KITS/TRAYS/PACK) ×2 IMPLANT
TUBE SUCT KAM VAC (TUBING) IMPLANT

## 2020-11-19 NOTE — Progress Notes (Signed)
The patient has been re-examined in the preop area with her daughter at the bedside.  There has been no interval changes to the documented history and physical.    The details of the operation as well as the risks, benefits, and alternatives have been discussed at length with the patient and her daughter and they are in agreement with the plan for a right hip hemiarthroplasty for treatment of her right femoral neck hip fracture.  I answered all her questions.  Patient's right hip was marked according to hospital's quickset of surgery protocol.  I reviewed the patient's labs and radiographs in preparation for this case.Marland Kitchen

## 2020-11-19 NOTE — Plan of Care (Signed)

## 2020-11-19 NOTE — Anesthesia Preprocedure Evaluation (Addendum)
Anesthesia Evaluation  Patient identified by MRN, date of birth, ID band Patient awake    Reviewed: Allergy & Precautions, NPO status , Patient's Chart, lab work & pertinent test results, reviewed documented beta blocker date and time   History of Anesthesia Complications Negative for: history of anesthetic complications  Airway Mallampati: I       Dental  (+) Edentulous Upper, Edentulous Lower   Pulmonary COPD,  COPD inhaler, former smoker,    Pulmonary exam normal        Cardiovascular Exercise Tolerance: Good METS: 5 - 7 Mets hypertension, + CAD ( with PCI)  Cardiac stents: h/o CORONARY/GRAFT ACUTE MI REVASCULARIZATION.  + dysrhythmias (on edoxaban) Atrial Fibrillation  Rhythm:Irregular Rate:Normal     Neuro/Psych TIA (remote history)negative psych ROS   GI/Hepatic Neg liver ROS, GERD  ,  Endo/Other  diabetes, Well Controlled, Type 2, Oral Hypoglycemic Agents  Renal/GU CRFRenal disease  negative genitourinary   Musculoskeletal negative musculoskeletal ROS (+) right femoral neck fracture   Abdominal Normal abdominal exam  (+)   Peds  Hematology  (+) anemia ,   Anesthesia Other Findings   Reproductive/Obstetrics                            Anesthesia Physical Anesthesia Plan  ASA: 3  Anesthesia Plan: Spinal   Post-op Pain Management:    Induction:   PONV Risk Score and Plan: Propofol infusion and Treatment may vary due to age or medical condition  Airway Management Planned: Nasal Cannula and Natural Airway  Additional Equipment:   Intra-op Plan:   Post-operative Plan:   Informed Consent: I have reviewed the patients History and Physical, chart, labs and discussed the procedure including the risks, benefits and alternatives for the proposed anesthesia with the patient or authorized representative who has indicated his/her understanding and acceptance.     Dental advisory  given  Plan Discussed with: CRNA, Anesthesiologist and Surgeon  Anesthesia Plan Comments:        Anesthesia Quick Evaluation

## 2020-11-19 NOTE — Anesthesia Procedure Notes (Signed)
Spinal  Patient location during procedure: OR Start time: 11/19/2020 11:10 AM End time: 11/19/2020 11:12 AM Reason for block: surgical anesthesia Staffing Performed: resident/CRNA  Anesthesiologist: Iran Ouch, MD Resident/CRNA: Rolla Plate, CRNA Preanesthetic Checklist Completed: patient identified, IV checked, site marked, risks and benefits discussed, surgical consent, monitors and equipment checked, pre-op evaluation and timeout performed Spinal Block Patient position: right lateral decubitus Prep: ChloraPrep and site prepped and draped Patient monitoring: heart rate, continuous pulse ox, blood pressure and cardiac monitor Approach: midline Location: L4-5 Injection technique: single-shot Needle Needle type: Whitacre and Introducer  Needle gauge: 24 G Needle length: 9 cm Assessment Events: CSF return Additional Notes Negative paresthesia. Negative blood return. Positive free-flowing CSF. Expiration date of kit checked and confirmed. Patient tolerated procedure well, without complications.

## 2020-11-19 NOTE — Op Note (Signed)
11/19/2020  1:12 PM  PATIENT:  Angelica Ferguson   MRN: [id number]  PRE-OPERATIVE DIAGNOSIS:  Right displaced femoral neck hip Fracture  POST-OPERATIVE DIAGNOSIS: Same  PROCEDURE:  Procedure(s): Right hip hemiarthroplasty  PREOPERATIVE INDICATIONS:    Angelica Ferguson is an 87 y.o. female who was admitted with a diagnosis of Right Hip Fracture.  I have recommended surgical fixation with hemiarthroplasty for this injury. I have explained the surgery and the postoperative course to the patient and their daughter who have agreed with surgical management of this fracture.    The risks benefits and alternatives were discussed with the patient and their family including but not limited to the risks of  infection requiring removal of the prosthesis, bleeding requiring blood transfusion, nerve injury especially to the sciatic nerve leading to foot drop or lower extremity numbness, periprosthetic fracture, dislocation leg length discrepancy, change in lower extremity rotation persistent hip pain, loosening or failure of the components and the need for revision surgery. Medical risks include but are not limited to DVT and pulmonary embolism, myocardial infarction, stroke, pneumonia, respiratory failure and death.  OPERATIVE REPORT     SURGEON:  Thornton Park, MD    ASSISTANT:  Olean Ree, RNFA    ANESTHESIA:  Spinal    COMPLICATIONS:  None.   EBL:  50 cc  SPECIMEN: Femoral head to pathology    COMPONENTS:  Stryker Accolade 2 femoral component size 3 with a 47 mm -4 neck adjustment sleeve.    PROCEDURE IN DETAIL:   The patient was met in the holding area with her daughter.  The right hip was identified and marked at the operative site after verbally confirming with the patient that this was the correct site of surgery.  The patient was then transported to the OR  and  underwent spinal anesthesia.  The patient was then placed in the lateral decubitus position with the operative side up and  secured on the operating room table with a pegboard and all bony prominences were adequately padded. This included an axillary roll and additional padding around the nonoperative leg to prevent compression to the common peroneal nerve.    The operative lower extremity was prepped and draped in a sterile fashion.  A time out was performed prior to incision to verify patient's name, date of birth, medical record number, correct site of surgery correct procedure to be performed. The timeout was also used to verify the patient received antibiotics now appropriate instruments, implants and radiographic studies were available in the room. Once all in attendance were in agreement case began.    A posterolateral approach was utilized via sharp dissection  carried down to the subcutaneous tissue.  Bleeding vessels were coagulated using electrocautery.  The fascia lata was identified and incised along the length of the skin incision.  The gluteus maximus muscle was then split in line with its fibers. Self-retaining retractors were  inserted.  With the hip internally rotated, the short external rotators  were identified and removed from the posterior attachment from the greater trochanter. The piriformis was tagged for later repair. The capsule was identified and a T-shaped capsulotomy was performed. The capsule was tagged with #2 Tycron for later repair.  The femoral neck fracture was exposed, and the femoral head was removed using a corkscrew device. This was measured to be 47 mm in diameter. The attention was then turned to proximal femur preparation.  An oscillating saw was used to perform a proximal femoral osteotomy 1 fingerbreadth above the  lesser trochanter. The trial 47 mm femoral head was placed into the acetabulum and had an excellent suction fit. The attention was then turned back to femoral preparation.    A femoral skid and Cobra retractor were placed under the femoral neck to allow for adequate  visualization. A box osteotome was used to make the initial entry into the proximal femur. A single hand reamer was used to prepare the femoral canal. A T-shaped femoral canal sounder was then used to ensure no penetration femoral cortex had occurred during reaming. The proximal femur was then sequentially broached by hand. A size 3 Accolade 2 femoral trial broach was found to have best medial to lateral canal fit. Once adequate mediolateral canal fill was achieved the trial femoral broach, neck, and head was assembled and the hip was reduced. It was found to have excellent stability, equivalent leg lengths with functional range of motion. The trial components were then removed.  I copiously irrigated the femoral canal and then impacted the real femoral prosthesis into place into the appropriate version, slightly anteverted to the normal anatomy, and I impacted the actual Stryker 47 mm Unitrax femoral component with a -4 neck adjustment sleeve into place. The hip was then reduced and taken through functional range of motion and found to have excellent stability. Leg lengths were restored. The hip joint was copiously irrigated.   A soft tissue repair of the capsule and external rotators was performed using #2 Tycron.  An excellent posterior capsular repair was achieved. The fascia lata was then closed with interrupted 0 Vicryl suture. The subcutaneous tissues were closed with 2-0 Vicryl and the skin approximated with staples.   The patient was then placed supine on the operative table. Leg lengths were checked clinically and found to be equivalent. An abduction pillow was placed between the lower extremities. The patient was then transferred to a hospital bed and brought to the PACU in stable condition. I was scrubbed and present the entire case and all sharp, sponge and instrument counts were correct at the conclusion of the case. I spoke with the patient's daughter by phone from the PACU to let her know the  case was completed without complication and the patient was stable in recovery room.    Timoteo Gaul, MD Orthopedic Surgeon

## 2020-11-19 NOTE — Transfer of Care (Signed)
Immediate Anesthesia Transfer of Care Note  Patient: Angelica Ferguson  Procedure(s) Performed: ARTHROPLASTY BIPOLAR HIP (HEMIARTHROPLASTY) (Right: Hip)  Patient Location: PACU  Anesthesia Type:Spinal  Level of Consciousness: awake and alert   Airway & Oxygen Therapy: Patient Spontanous Breathing and Patient connected to face mask oxygen  Post-op Assessment: Report given to RN and Post -op Vital signs reviewed and stable  Post vital signs: Reviewed  Last Vitals:  Vitals Value Taken Time  BP 117/87 11/19/20 1256  Temp    Pulse 89 11/19/20 1258  Resp 21 11/19/20 1258  SpO2 98 % 11/19/20 1258  Vitals shown include unvalidated device data.  Last Pain:  Vitals:   11/19/20 0900  TempSrc: Oral  PainSc: 0-No pain      Patients Stated Pain Goal: 2 (11/18/20 2138)  Complications: No notable events documented.

## 2020-11-19 NOTE — Progress Notes (Signed)
PROGRESS NOTE    Angelica Ferguson  UXL:[id number] DOB: 30-Apr-1932 DOA: 11/16/2020 PCP: Vonna Kotyk, NP   Brief Narrative:   87 y.o. female with a past medical history of persistent atrial fibrillation on edoxaban, coronary artery disease with PCI, hypertension, non-insulin-dependent diabetes mellitus type 2, GERD.  The patient is visiting family from [place].  She had a mechanical fall last Wednesday.  Has been unable to ambulate and is still having right hip pain since.  Therefore came to the emergency department for further evaluation of the right hip pain.  Hemodynamics are stable.  Plain film imaging shows a right femoral neck fracture with varus angulation.  Orthopedics has been contacted.  Patient took her anticoagulant edoxaban on the day of admission in the morning.  As such surgery will have to be delayed.    Patient underwent right hip hemiarthroplasty on 7/28 with orthopedics.  Tolerated procedure well.  No immediate postoperative complications.   Assessment & Plan:   Active Problems:   Hip fracture (HCC)   Malnutrition of moderate degree  Right femoral neck fracture secondary to mechanical fall Patient presented 1 week after mechanical fall Had persistent and worsening pain Imaging significant for right femoral neck fracture with varus angulation Pain well controlled Orthopedics on consult Status post right hip hemiarthroplasty 7/28 Plan: Resume diet postoperatively.  Check postoperative labs tomorrow morning.  If hemoglobin stable can likely restart home edoxaban 7/29  Persistent atrial fibrillation Holding home anticoagulation for now, can likely start postop day 1 Continue home bisoprolol Continue home Cardizem As needed EKG for tachycardia or chest pain  Essential hypertension Home ramipril 10 mg daily Hold dose on day of surgery, restart postop day 1  Non-insulin-dependent diabetes mellitus No contrast exposure anticipated Metformin is been  discontinued to avoid hypoglycemia Sliding scale coverage Carb modified diet  Coronary artery disease Hyperlipidemia Resume high-dose statin Holding home doxepin  GERD Continue Prevacid 15 mg per home dose  Leukocytosis Suspect reactive in nature No evidence of infection  DVT prophylaxis: SQ Lovenox Code Status: Full Family Communication: Daughter at bedside 7/28 Disposition Plan: Status is: Inpatient  Remains inpatient appropriate because:Inpatient level of care appropriate due to severity of illness  Dispo: The patient is from: Home              Anticipated d/c is to: SNF              Patient currently is not medically stable to d/c.   Difficult to place patient No   Postop day #0 status post right hip hemiarthroplasty    Level of care: Med-Surg  Consultants:  Orthopedics  Procedures:  None  Antimicrobials:  None   Subjective: Seen and examined.  Postop day 0.  Resting comfortably in bed.  No visible distress.  Daughter at bedside.  Objective: Vitals:   11/19/20 1400 11/19/20 1415 11/19/20 1430 11/19/20 1519  BP: (!) 125/53 (!) 127/55 (!) 133/93 (!) 142/73  Pulse:  86  87  Resp: 15 18 (!) 33 16  Temp:    97.8 F (36.6 C)  TempSrc:      SpO2: 94% 95%  100%  Weight:      Height:        Intake/Output Summary (Last 24 hours) at 11/19/2020 1542 Last data filed at 11/19/2020 1236 Gross per 24 hour  Intake 600 ml  Output 480 ml  Net 120 ml   Filed Weights   11/17/20 1541  Weight: 40.8 kg    Examination:  General exam: No acute distress.  Resting comfortably in bed Respiratory system: Clear to auscultation. Respiratory effort normal. Cardiovascular system: S1-S2, regular rate and rhythm, no murmurs, no pedal edema  gastrointestinal system: Abdomen is nondistended, soft and nontender. No organomegaly or masses felt. Normal bowel sounds heard. Central nervous system: Alert and oriented. No focal neurological deficits. Extremities: Right hip with  surgical dressings.  Not removed.  Dressing CDI Skin: No rashes, lesions or ulcers Psychiatry: Judgement and insight appear normal. Mood & affect appropriate.     Data Reviewed: I have personally reviewed following labs and imaging studies  CBC: Recent Labs  Lab 11/16/20 1434 11/19/20 0350  WBC 13.8* 12.5*  NEUTROABS 9.8* 9.0*  HGB 11.9* 10.4*  HCT 37.5 32.7*  MCV 87.6 87.4  PLT 367 400   Basic Metabolic Panel: Recent Labs  Lab 11/16/20 1434 11/19/20 0350  NA 134* 135  K 4.4 4.8  CL 101 98  CO2 24 28  GLUCOSE 111* 121*  BUN 25* 45*  CREATININE 0.86 1.04*  CALCIUM 8.9 8.9  MG  --  1.9   GFR: Estimated Creatinine Clearance: 25.5 mL/min (A) (by C-G formula based on SCr of 1.04 mg/dL (H)). Liver Function Tests: Recent Labs  Lab 11/16/20 1434  AST 27  ALT 21  ALKPHOS 104  BILITOT 1.0  PROT 6.5  ALBUMIN 3.1*   No results for input(s): LIPASE, AMYLASE in the last 168 hours. No results for input(s): AMMONIA in the last 168 hours. Coagulation Profile: Recent Labs  Lab 11/16/20 1434 11/19/20 0350  INR 1.6* 1.1   Cardiac Enzymes: No results for input(s): CKTOTAL, CKMB, CKMBINDEX, TROPONINI in the last 168 hours. BNP (last 3 results) No results for input(s): PROBNP in the last 8760 hours. HbA1C: No results for input(s): HGBA1C in the last 72 hours.  CBG: Recent Labs  Lab 11/18/20 0739 11/18/20 1207 11/18/20 1214 11/18/20 2124 11/19/20 0843  GLUCAP 129* 134* 133* 154* 133*   Lipid Profile: No results for input(s): CHOL, HDL, LDLCALC, TRIG, CHOLHDL, LDLDIRECT in the last 72 hours. Thyroid Function Tests: No results for input(s): TSH, T4TOTAL, FREET4, T3FREE, THYROIDAB in the last 72 hours. Anemia Panel: No results for input(s): VITAMINB12, FOLATE, FERRITIN, TIBC, IRON, RETICCTPCT in the last 72 hours. Sepsis Labs: No results for input(s): PROCALCITON, LATICACIDVEN in the last 168 hours.  Recent Results (from the past 240 hour(s))  Resp Panel by  RT-PCR (Flu A&B, Covid) Nasopharyngeal Swab     Status: None   Collection Time: 11/16/20  2:34 PM   Specimen: Nasopharyngeal Swab; Nasopharyngeal(NP) swabs in vial transport medium  Result Value Ref Range Status   SARS Coronavirus 2 by RT PCR NEGATIVE NEGATIVE Final    Comment: (NOTE) SARS-CoV-2 target nucleic acids are NOT DETECTED.  The SARS-CoV-2 RNA is generally detectable in upper respiratory specimens during the acute phase of infection. The lowest concentration of SARS-CoV-2 viral copies this assay can detect is 138 copies/mL. A negative result does not preclude SARS-Cov-2 infection and should not be used as the sole basis for treatment or other patient management decisions. A negative result may occur with  improper specimen collection/handling, submission of specimen other than nasopharyngeal swab, presence of viral mutation(s) within the areas targeted by this assay, and inadequate number of viral copies(<138 copies/mL). A negative result must be combined with clinical observations, patient history, and epidemiological information. The expected result is Negative.  Fact Sheet for Patients:  BloggerCourse.comhttps://www.fda.gov/media/152166/download  Fact Sheet for Healthcare Providers:  SeriousBroker.ithttps://www.fda.gov/media/152162/download  This  test is no t yet approved or cleared by the [place] and  has been authorized for detection and/or diagnosis of SARS-CoV-2 by FDA under an Emergency Use Authorization (EUA). This EUA will remain  in effect (meaning this test can be used) for the duration of the COVID-19 declaration under Section 564(b)(1) of the Act, 21 U.S.C.section 360bbb-3(b)(1), unless the authorization is terminated  or revoked sooner.       Influenza A by PCR NEGATIVE NEGATIVE Final   Influenza B by PCR NEGATIVE NEGATIVE Final    Comment: (NOTE) The Xpert Xpress SARS-CoV-2/FLU/RSV plus assay is intended as an aid in the diagnosis of influenza from Nasopharyngeal swab  specimens and should not be used as a sole basis for treatment. Nasal washings and aspirates are unacceptable for Xpert Xpress SARS-CoV-2/FLU/RSV testing.  Fact Sheet for Patients: BloggerCourse.com  Fact Sheet for Healthcare Providers: SeriousBroker.it  This test is not yet approved or cleared by the [place] FDA and has been authorized for detection and/or diagnosis of SARS-CoV-2 by FDA under an Emergency Use Authorization (EUA). This EUA will remain in effect (meaning this test can be used) for the duration of the COVID-19 declaration under Section 564(b)(1) of the Act, 21 U.S.C. section 360bbb-3(b)(1), unless the authorization is terminated or revoked.  Performed at [facility], [address] [place], [place] [address]   Surgical PCR screen     Status: None   Collection Time: 11/17/20  4:45 AM   Specimen: Nasal Mucosa; Nasal Swab  Result Value Ref Range Status   MRSA, PCR NEGATIVE NEGATIVE Final   Staphylococcus aureus NEGATIVE NEGATIVE Final    Comment: (NOTE) The Xpert SA Assay (FDA approved for NASAL specimens in patients 53 years of age and older), is one component of a comprehensive surveillance program. It is not intended to diagnose infection nor to guide or monitor treatment. Performed at [facility], [address] [place], [place] [address]          Radiology Studies: DG Hip Port Unilat With Pelvis 1V Right  Result Date: 11/19/2020 CLINICAL DATA:  Status post arthroplasty. EXAM: DG HIP (WITH OR WITHOUT PELVIS) 1V PORT RIGHT COMPARISON:  None. FINDINGS: Right hip prosthesis appears to be well situated. Aspect of postoperative changes seen in the surrounding soft tissues. IMPRESSION: Status post right hip arthroplasty. Electronically Signed   By: Lupita Raider M.D.   On: 11/19/2020 14:06        Scheduled Meds:  acetaminophen  500 mg Oral Q6H   albuterol       [MAR Hold]  atorvastatin  80 mg Oral QPM   [MAR Hold] beclomethasone  1 puff Inhalation BID   [MAR Hold] bisoprolol  5 mg Oral BID   [MAR Hold] diltiazem  60 mg Oral BID   docusate sodium  100 mg Oral BID   [START ON 11/20/2020] edoxaban  30 mg Oral Daily   [MAR Hold] feeding supplement  237 mL Oral BID BM   [MAR Hold] Glycopyrrolate-Formoterol  2 puff Inhalation BID   [MAR Hold] insulin aspart  0-5 Units Subcutaneous QHS   [MAR Hold] insulin aspart  0-9 Units Subcutaneous TID WC   ipratropium-albuterol       [MAR Hold] mupirocin ointment  1 application Nasal BID   [MAR Hold] pantoprazole  20 mg Oral Daily   senna  1 tablet Oral BID   traMADol  50 mg Oral Q6H   Continuous Infusions:  sodium chloride  acetaminophen      ceFAZolin (ANCEF) IV     methocarbamol (ROBAXIN) IV       LOS: 3 days    Time spent: 25 minutes    Tresa Moore, MD [facility] Pager [phone]  If 7PM-7AM, please contact night-coverage 11/19/2020, 3:42 PM

## 2020-11-20 ENCOUNTER — Encounter: Payer: Self-pay | Admitting: Orthopedic Surgery

## 2020-11-20 LAB — BASIC METABOLIC PANEL
Anion gap: 5 (ref 5–15)
BUN: 27 mg/dL — ABNORMAL HIGH (ref 8–23)
CO2: 29 mmol/L (ref 22–32)
Calcium: 8.4 mg/dL — ABNORMAL LOW (ref 8.9–10.3)
Chloride: 101 mmol/L (ref 98–111)
Creatinine, Ser: 0.8 mg/dL (ref 0.44–1.00)
GFR, Estimated: >60 mL/min (ref >60)
Glucose, Bld: 142 mg/dL — ABNORMAL HIGH (ref 70–99)
Potassium: 4.2 mmol/L (ref 3.5–5.1)
Sodium: 135 mmol/L (ref 135–145)

## 2020-11-20 LAB — GLUCOSE, CAPILLARY
Glucose-Capillary: 145 mg/dL — ABNORMAL HIGH (ref 70–99)
Glucose-Capillary: 135 mg/dL — ABNORMAL HIGH (ref 70–99)
Glucose-Capillary: 98 mg/dL (ref 70–99)
Glucose-Capillary: 148 mg/dL — ABNORMAL HIGH (ref 70–99)

## 2020-11-20 LAB — CBC
HCT: 30.2 % — ABNORMAL LOW (ref 36.0–46.0)
Hemoglobin: 9.7 g/dL — ABNORMAL LOW (ref 12.0–15.0)
MCH: 28.2 pg (ref 26.0–34.0)
MCHC: 32.1 g/dL (ref 30.0–36.0)
MCV: 87.8 fL (ref 80.0–100.0)
Platelets: 355 10*3/uL (ref 150–400)
RBC: 3.44 MIL/uL — ABNORMAL LOW (ref 3.87–5.11)
RDW: 15.6 % — ABNORMAL HIGH (ref 11.5–15.5)
WBC: 17.9 10*3/uL — ABNORMAL HIGH (ref 4.0–10.5)
nRBC: 0 % (ref 0.0–0.2)

## 2020-11-20 NOTE — Anesthesia Postprocedure Evaluation (Signed)
Anesthesia Post Note  Patient: Audiological scientist  Procedure(s) Performed: ARTHROPLASTY BIPOLAR HIP (HEMIARTHROPLASTY) (Right: Hip)  Patient location during evaluation: Nursing Unit Anesthesia Type: Spinal Level of consciousness: oriented and awake and alert Pain management: pain level controlled Vital Signs Assessment: post-procedure vital signs reviewed and stable Respiratory status: spontaneous breathing and respiratory function stable Cardiovascular status: blood pressure returned to baseline and stable Postop Assessment: no headache, no backache and no apparent nausea or vomiting Anesthetic complications: no   No notable events documented.   Last Vitals:  Vitals:   11/20/20 0427 11/20/20 0740  BP: (!) 143/82 119/62  Pulse: (!) 105 89  Resp: 16 16  Temp: 36.9 C 37 C  SpO2: 93% 94%    Last Pain:  Vitals:   11/20/20 0716  TempSrc:   PainSc: 9                  Ricca Melgarejo [address]

## 2020-11-20 NOTE — Progress Notes (Signed)
Subjective:  POD #2 s/p right hip hemiarthroplasty.   Patient reports right pain currently as mild, but states she had significant pain earlier.  The pain medicine has helped her pain but she is drowsy which limited her ability to perform this AM with PT.  Her foley catheter has been removed.   Her daughter is at the bedside and explains the patient has not had an appetite and did not eat breakfast or lunch.  Objective:   VITALS:   Vitals:   11/20/20 0027 11/20/20 0427 11/20/20 0740 11/20/20 1152  BP: 127/72 (!) 143/82 119/62 105/60  Pulse: (!) 110 (!) 105 89 85  Resp: 16 16 16 16   Temp: 98.4 F (36.9 C) 98.4 F (36.9 C) 98.6 F (37 C) 98.4 F (36.9 C)  TempSrc:      SpO2: 95% 93% 94% 90%  Weight:      Height:        PHYSICAL EXAM: Right lower extremity Neurovascular intact Sensation intact distally Intact pulses distally Dorsiflexion/Plantar flexion intact Aquacel dressing: scant drainage No cellulitis present Compartment soft  LABS  Results for orders placed or performed during the hospital encounter of 11/16/20 (from the past 24 hour(s))  Glucose, capillary     Status: Abnormal   Collection Time: 11/19/20  4:15 PM  Result Value Ref Range   Glucose-Capillary 154 (H) 70 - 99 mg/dL  Glucose, capillary     Status: Abnormal   Collection Time: 11/19/20  9:57 PM  Result Value Ref Range   Glucose-Capillary 111 (H) 70 - 99 mg/dL   Comment 1 Notify RN   CBC     Status: Abnormal   Collection Time: 11/20/20  6:04 AM  Result Value Ref Range   WBC 17.9 (H) 4.0 - 10.5 K/uL   RBC 3.44 (L) 3.87 - 5.11 MIL/uL   Hemoglobin 9.7 (L) 12.0 - 15.0 g/dL   HCT 11/22/20 (L) 35.7 - 01.7 %   MCV 87.8 80.0 - 100.0 fL   MCH 28.2 26.0 - 34.0 pg   MCHC 32.1 30.0 - 36.0 g/dL   RDW 79.3 (H) 90.3 - 00.9 %   Platelets 355 150 - 400 K/uL   nRBC 0.0 0.0 - 0.2 %  Basic metabolic panel     Status: Abnormal   Collection Time: 11/20/20  6:04 AM  Result Value Ref Range   Sodium 135 135 - 145 mmol/L    Potassium 4.2 3.5 - 5.1 mmol/L   Chloride 101 98 - 111 mmol/L   CO2 29 22 - 32 mmol/L   Glucose, Bld 142 (H) 70 - 99 mg/dL   BUN 27 (H) 8 - 23 mg/dL   Creatinine, Ser 11/22/20 0.44 - 1.00 mg/dL   Calcium 8.4 (L) 8.9 - 10.3 mg/dL   GFR, Estimated 0.07 >62 mL/min   Anion gap 5 5 - 15  Glucose, capillary     Status: Abnormal   Collection Time: 11/20/20  8:53 AM  Result Value Ref Range   Glucose-Capillary 145 (H) 70 - 99 mg/dL  Glucose, capillary     Status: Abnormal   Collection Time: 11/20/20 11:51 AM  Result Value Ref Range   Glucose-Capillary 135 (H) 70 - 99 mg/dL    DG Hip Port Unilat With Pelvis 1V Right  Result Date: 11/19/2020 CLINICAL DATA:  Status post arthroplasty. EXAM: DG HIP (WITH OR WITHOUT PELVIS) 1V PORT RIGHT COMPARISON:  None. FINDINGS: Right hip prosthesis appears to be well situated. Aspect of postoperative changes seen in the  surrounding soft tissues. IMPRESSION: Status post right hip arthroplasty. Electronically Signed   By: Lupita Raider M.D.   On: 11/19/2020 14:06    Assessment/Plan: 1 Day Post-Op   Active Problems:   Hip fracture (HCC)   Malnutrition of moderate degree  Patient is stable post-op.   She is having mild intermittent tachycardia.  Patient will resume her oral Xa inhibitor anticoagulant today for her a. Fib.  Continue current pain management, but wean narcotics when possible to reduce drowsiness.  Continue PT as she can tolerate.  Recheck labs in the AM.  Encourage Ensure shakes as a means of getting caloric intake until her appetite improves.    Juanell Fairly , MD 11/20/2020, 1:49 PM

## 2020-11-20 NOTE — NC FL2 (Signed)
[place] MEDICAID FL2 LEVEL OF CARE SCREENING TOOL     IDENTIFICATION  Patient Name: Angelica Ferguson Birthdate: 12-15-1932 Sex: female Admission Date (Current Location): 11/16/2020  [facility] and [place] Number:  Chiropodist and Address:  [facility], [address], [place], [place] [address]      Provider Number: [id number]  Attending Physician Name and Address:  Tresa Moore, MD  Relative Name and Phone Number:  [name], daughter, [phone]    Current Level of Care: Hospital Recommended Level of Care: Skilled Nursing Facility Prior Approval Number:    Date Approved/Denied:   PASRR Number: [id number]  Discharge Plan: SNF    Current Diagnoses: Patient Active Problem List   Diagnosis Date Noted   Malnutrition of moderate degree 11/18/2020   Hip fracture (HCC) 11/16/2020    Orientation RESPIRATION BLADDER Height & Weight     Self, Time, Situation, Place  Normal External catheter Weight: 40.8 kg Height:  5\' 3"  (160 cm)  BEHAVIORAL SYMPTOMS/MOOD NEUROLOGICAL BOWEL NUTRITION STATUS      Continent Diet (regular)  AMBULATORY STATUS COMMUNICATION OF NEEDS Skin   Extensive Assist Verbally Surgical wounds                       Personal Care Assistance Level of Assistance  Bathing, Dressing Bathing Assistance: Limited assistance   Dressing Assistance: Limited assistance     Functional Limitations Info  Hearing   Hearing Info: Impaired      SPECIAL CARE FACTORS FREQUENCY  PT (By licensed PT), OT (By licensed OT)     PT Frequency: 5 times per week OT Frequency: 5 times per week            Contractures Contractures Info: Not present    Additional Factors Info  Code Status, Allergies Code Status Info: FUll Code Allergies Info: NKDA           Current Medications (11/20/2020):  This is the current hospital active medication list Current Facility-Administered Medications  Medication Dose Route Frequency  Provider Last Rate Last Admin   0.9 %  sodium chloride infusion  75 mL/hr Intravenous Continuous 11/22/2020, MD 75 mL/hr at 11/19/20 1830 75 mL/hr at 11/19/20 1830   acetaminophen (TYLENOL) tablet 650 mg  650 mg Oral Q6H PRN 11/21/20, MD   650 mg at 11/17/20 1011   Or   acetaminophen (TYLENOL) suppository 650 mg  650 mg Rectal Q6H PRN 11/19/20, MD       albuterol (VENTOLIN HFA) 108 (90 Base) MCG/ACT inhaler 1-2 puff  1-2 puff Inhalation Q6H PRN Juanell Fairly, MD       alum & mag hydroxide-simeth (MAALOX/MYLANTA) 200-200-20 MG/5ML suspension 30 mL  30 mL Oral Q4H PRN 11-01-2000, MD       atorvastatin (LIPITOR) tablet 80 mg  80 mg Oral QPM Juanell Fairly, MD   80 mg at 11/19/20 1730   beclomethasone (QVAR) 80 MCG/ACT inhaler 1 puff  1 puff Inhalation BID 11/21/20, MD   1 puff at 11/20/20 0901   bisacodyl (DULCOLAX) suppository 10 mg  10 mg Rectal Daily PRN 11/22/20, MD       bisoprolol (ZEBETA) tablet 5 mg  5 mg Oral BID Juanell Fairly, MD   5 mg at 11/20/20 0900   Chlorhexidine Gluconate Cloth 2 % PADS 6 each  6 each Topical Daily 11/22/20 B, MD   6 each at 11/20/20 0901   diltiazem (CARDIZEM) tablet 60  mg  60 mg Oral BID Juanell Fairly, MD   60 mg at 11/20/20 0900   docusate sodium (COLACE) capsule 100 mg  100 mg Oral BID Juanell Fairly, MD   100 mg at 11/20/20 0900   edoxaban (SAVAYSA) tablet 30 mg  30 mg Oral Daily Juanell Fairly, MD   30 mg at 11/20/20 0900   feeding supplement (ENSURE ENLIVE / ENSURE PLUS) liquid 237 mL  237 mL Oral BID BM Juanell Fairly, MD   237 mL at 11/20/20 0901   Glycopyrrolate-Formoterol 9-4.8 MCG/ACT AERO 2 puff  2 puff Inhalation BID Juanell Fairly, MD   2 puff at 11/20/20 0901   HYDROcodone-acetaminophen (NORCO/VICODIN) 5-325 MG per tablet 1-2 tablet  1-2 tablet Oral Q4H PRN Juanell Fairly, MD   1 tablet at 11/20/20 0424   insulin aspart (novoLOG) injection 0-5 Units  0-5 Units Subcutaneous QHS  Juanell Fairly, MD       insulin aspart (novoLOG) injection 0-9 Units  0-9 Units Subcutaneous TID WC Juanell Fairly, MD   1 Units at 11/20/20 1205   methocarbamol (ROBAXIN) tablet 500 mg  500 mg Oral Q6H PRN Juanell Fairly, MD   500 mg at 11/19/20 4081   Or   methocarbamol (ROBAXIN) 500 mg in dextrose 5 % 50 mL IVPB  500 mg Intravenous Q6H PRN Juanell Fairly, MD       morphine 2 MG/ML injection 0.5-1 mg  0.5-1 mg Intravenous Q2H PRN Juanell Fairly, MD   1 mg at 11/20/20 0716   mupirocin ointment (BACTROBAN) 2 % 1 application  1 application Nasal BID Juanell Fairly, MD   1 application at 11/19/20 2321   ondansetron (ZOFRAN) tablet 4 mg  4 mg Oral Q6H PRN Juanell Fairly, MD       Or   ondansetron [facility]) injection 4 mg  4 mg Intravenous Q6H PRN Juanell Fairly, MD   4 mg at 11/20/20 0716   pantoprazole (PROTONIX) EC tablet 20 mg  20 mg Oral Daily Juanell Fairly, MD   20 mg at 11/20/20 0900   polyethylene glycol (MIRALAX / GLYCOLAX) packet 17 g  17 g Oral Daily PRN Juanell Fairly, MD   17 g at 11/18/20 1722   senna (SENOKOT) tablet 8.6 mg  1 tablet Oral BID Juanell Fairly, MD   8.6 mg at 11/20/20 0900   sodium phosphate (FLEET) 7-19 GM/118ML enema 1 enema  1 enema Rectal Once PRN Juanell Fairly, MD         Discharge Medications: Please see discharge summary for a list of discharge medications.  Relevant Imaging Results:  Relevant Lab Results:   Additional Information self Pay,  SS# [phone]  Barrie Dunker, RN

## 2020-11-20 NOTE — Progress Notes (Signed)
PT Cancellation Note  Patient Details Name: Angelica Ferguson MRN: [id number] DOB: [birth year]   Cancelled Treatment:    Reason Eval/Treat Not Completed: Fatigue/lethargy limiting ability to participate (Per OT, pt fatigued and troubleshooting pain control strategy, would not tolerate PT evalaution at this time. Author will reattempt evalaution later in day.)  11:46 AM, 11/20/20 Rosamaria Lints, PT, DPT Physical Therapist - [facility]  [phone] (ASCOM)    Kashae Carstens C 11/20/2020, 11:46 AM

## 2020-11-20 NOTE — Progress Notes (Signed)
PROGRESS NOTE    Angelica PontoColleen Ferguson  ZOX:[id number] DOB: April 18, 1933 DOA: 11/16/2020 PCP: Vonna KotykBlandford, Kristen K, NP   Brief Narrative:   87 y.o. female with a past medical history of persistent atrial fibrillation on edoxaban, coronary artery disease with PCI, hypertension, non-insulin-dependent diabetes mellitus type 2, GERD.  The patient is visiting family from [place].  She had a mechanical fall last Wednesday.  Has been unable to ambulate and is still having right hip pain since.  Therefore came to the emergency department for further evaluation of the right hip pain.  Hemodynamics are stable.  Plain film imaging shows a right femoral neck fracture with varus angulation.  Orthopedics has been contacted.  Patient took her anticoagulant edoxaban on the day of admission in the morning.  As such surgery will have to be delayed.    Patient underwent right hip hemiarthroplasty on 7/28 with orthopedics.  Tolerated procedure well.  No immediate postoperative complications.    Assessment & Plan:   Active Problems:   Hip fracture (HCC)   Malnutrition of moderate degree  Right femoral neck fracture secondary to mechanical fall Patient presented 1 week after mechanical fall Had persistent and worsening pain Imaging significant for right femoral neck fracture with varus angulation Pain well controlled Orthopedics on consult Status post right hip hemiarthroplasty 7/28 Plan: Okay for diet.  Multimodal pain control.  Home edoxaban has been restarted.  Bowel regimen.  Therapy evaluations as able.  We will likely need skilled nursing facility versus home with home health on discharge.  Persistent atrial fibrillation Heart rate controlled Home edoxaban started postoperatively Continue home bisoprolol Continue home Cardizem As needed EKG for tachycardia or chest pain  Essential hypertension Home ramipril on hold.  Blood pressure controlled  Non-insulin-dependent diabetes mellitus No contrast  exposure anticipated Metformin is been discontinued to avoid hypoglycemia Sliding scale coverage Carb modified diet  Coronary artery disease Hyperlipidemia Resume high-dose statin Resumed home edoxaban  GERD PTA Prevacid  Leukocytosis Suspect reactive in nature No evidence of infection  DVT prophylaxis: Edoxaban Code Status: Full Family Communication: Daughter at bedside 7/29 Disposition Plan: Status is: Inpatient  Remains inpatient appropriate because:Inpatient level of care appropriate due to severity of illness  Dispo: The patient is from: Home              Anticipated d/c is to: SNF              Patient currently is not medically stable to d/c.   Difficult to place patient No   Postoperative day 1 status post right hip hemiarthroplasty.  Pain control, bowel regimen, therapy evaluations as able    Level of care: Med-Surg  Consultants:  Orthopedics  Procedures:  None  Antimicrobials:  None   Subjective: Patient seen and examined.  Postoperative day 1.  Resting in bed.  While stationary no visible distress.  Pain endorsed upon ambulation and moving attempts.  Objective: Vitals:   11/20/20 0027 11/20/20 0427 11/20/20 0740 11/20/20 1152  BP: 127/72 (!) 143/82 119/62 105/60  Pulse: (!) 110 (!) 105 89 85  Resp: 16 16 16 16   Temp: 98.4 F (36.9 C) 98.4 F (36.9 C) 98.6 F (37 C) 98.4 F (36.9 C)  TempSrc:      SpO2: 95% 93% 94% 90%  Weight:      Height:        Intake/Output Summary (Last 24 hours) at 11/20/2020 1355 Last data filed at 11/20/2020 0900 Gross per 24 hour  Intake 1197.5 ml  Output  1100 ml  Net 97.5 ml   Filed Weights   11/17/20 1541  Weight: 40.8 kg    Examination:  General exam: No acute distress.  Resting comfortably in bed Respiratory system: Clear to auscultation. Respiratory effort normal. Cardiovascular system: S1-S2, regular rate and rhythm, no murmurs, no pedal edema  gastrointestinal system: Thin, soft, nontender,  nondistended, normal bowel sounds Central nervous system: Alert and oriented. No focal neurological deficits. Extremities: Right hip surgical dressing, CDI Skin: No rashes, lesions or ulcers Psychiatry: Judgement and insight appear normal. Mood & affect appropriate.     Data Reviewed: I have personally reviewed following labs and imaging studies  CBC: Recent Labs  Lab 11/16/20 1434 11/19/20 0350 11/20/20 0604  WBC 13.8* 12.5* 17.9*  NEUTROABS 9.8* 9.0*  --   HGB 11.9* 10.4* 9.7*  HCT 37.5 32.7* 30.2*  MCV 87.6 87.4 87.8  PLT 367 400 355   Basic Metabolic Panel: Recent Labs  Lab 11/16/20 1434 11/19/20 0350 11/20/20 0604  NA 134* 135 135  K 4.4 4.8 4.2  CL 101 98 101  CO2 24 28 29   GLUCOSE 111* 121* 142*  BUN 25* 45* 27*  CREATININE 0.86 1.04* 0.80  CALCIUM 8.9 8.9 8.4*  MG  --  1.9  --    GFR: Estimated Creatinine Clearance: 33.1 mL/min (by C-G formula based on SCr of 0.8 mg/dL). Liver Function Tests: Recent Labs  Lab 11/16/20 1434  AST 27  ALT 21  ALKPHOS 104  BILITOT 1.0  PROT 6.5  ALBUMIN 3.1*   No results for input(s): LIPASE, AMYLASE in the last 168 hours. No results for input(s): AMMONIA in the last 168 hours. Coagulation Profile: Recent Labs  Lab 11/16/20 1434 11/19/20 0350  INR 1.6* 1.1   Cardiac Enzymes: No results for input(s): CKTOTAL, CKMB, CKMBINDEX, TROPONINI in the last 168 hours. BNP (last 3 results) No results for input(s): PROBNP in the last 8760 hours. HbA1C: No results for input(s): HGBA1C in the last 72 hours.  CBG: Recent Labs  Lab 11/19/20 0843 11/19/20 1615 11/19/20 2157 11/20/20 0853 11/20/20 1151  GLUCAP 133* 154* 111* 145* 135*   Lipid Profile: No results for input(s): CHOL, HDL, LDLCALC, TRIG, CHOLHDL, LDLDIRECT in the last 72 hours. Thyroid Function Tests: No results for input(s): TSH, T4TOTAL, FREET4, T3FREE, THYROIDAB in the last 72 hours. Anemia Panel: No results for input(s): VITAMINB12, FOLATE,  FERRITIN, TIBC, IRON, RETICCTPCT in the last 72 hours. Sepsis Labs: No results for input(s): PROCALCITON, LATICACIDVEN in the last 168 hours.  Recent Results (from the past 240 hour(s))  Resp Panel by RT-PCR (Flu A&B, Covid) Nasopharyngeal Swab     Status: None   Collection Time: 11/16/20  2:34 PM   Specimen: Nasopharyngeal Swab; Nasopharyngeal(NP) swabs in vial transport medium  Result Value Ref Range Status   SARS Coronavirus 2 by RT PCR NEGATIVE NEGATIVE Final    Comment: (NOTE) SARS-CoV-2 target nucleic acids are NOT DETECTED.  The SARS-CoV-2 RNA is generally detectable in upper respiratory specimens during the acute phase of infection. The lowest concentration of SARS-CoV-2 viral copies this assay can detect is 138 copies/mL. A negative result does not preclude SARS-Cov-2 infection and should not be used as the sole basis for treatment or other patient management decisions. A negative result may occur with  improper specimen collection/handling, submission of specimen other than nasopharyngeal swab, presence of viral mutation(s) within the areas targeted by this assay, and inadequate number of viral copies(<138 copies/mL). A negative result must be combined  with clinical observations, patient history, and epidemiological information. The expected result is Negative.  Fact Sheet for Patients:  BloggerCourse.com  Fact Sheet for Healthcare Providers:  SeriousBroker.it  This test is no t yet approved or cleared by the [place] FDA and  has been authorized for detection and/or diagnosis of SARS-CoV-2 by FDA under an Emergency Use Authorization (EUA). This EUA will remain  in effect (meaning this test can be used) for the duration of the COVID-19 declaration under Section 564(b)(1) of the Act, 21 U.S.C.section 360bbb-3(b)(1), unless the authorization is terminated  or revoked sooner.       Influenza A by PCR NEGATIVE  NEGATIVE Final   Influenza B by PCR NEGATIVE NEGATIVE Final    Comment: (NOTE) The Xpert Xpress SARS-CoV-2/FLU/RSV plus assay is intended as an aid in the diagnosis of influenza from Nasopharyngeal swab specimens and should not be used as a sole basis for treatment. Nasal washings and aspirates are unacceptable for Xpert Xpress SARS-CoV-2/FLU/RSV testing.  Fact Sheet for Patients: BloggerCourse.com  Fact Sheet for Healthcare Providers: SeriousBroker.it  This test is not yet approved or cleared by the [place] FDA and has been authorized for detection and/or diagnosis of SARS-CoV-2 by FDA under an Emergency Use Authorization (EUA). This EUA will remain in effect (meaning this test can be used) for the duration of the COVID-19 declaration under Section 564(b)(1) of the Act, 21 U.S.C. section 360bbb-3(b)(1), unless the authorization is terminated or revoked.  Performed at [facility], [address] [place], [place] [address]   Surgical PCR screen     Status: None   Collection Time: 11/17/20  4:45 AM   Specimen: Nasal Mucosa; Nasal Swab  Result Value Ref Range Status   MRSA, PCR NEGATIVE NEGATIVE Final   Staphylococcus aureus NEGATIVE NEGATIVE Final    Comment: (NOTE) The Xpert SA Assay (FDA approved for NASAL specimens in patients 56 years of age and older), is one component of a comprehensive surveillance program. It is not intended to diagnose infection nor to guide or monitor treatment. Performed at [facility], [address], [place], [place] [address]          Radiology Studies: DG Hip Port Unilat With Pelvis 1V Right  Result Date: 11/19/2020 CLINICAL DATA:  Status post arthroplasty. EXAM: DG HIP (WITH OR WITHOUT PELVIS) 1V PORT RIGHT COMPARISON:  None. FINDINGS: Right hip prosthesis appears to be well situated. Aspect of postoperative changes seen in the surrounding soft tissues.  IMPRESSION: Status post right hip arthroplasty. Electronically Signed   By: Lupita Raider M.D.   On: 11/19/2020 14:06        Scheduled Meds:  atorvastatin  80 mg Oral QPM   beclomethasone  1 puff Inhalation BID   bisoprolol  5 mg Oral BID   Chlorhexidine Gluconate Cloth  6 each Topical Daily   diltiazem  60 mg Oral BID   docusate sodium  100 mg Oral BID   edoxaban  30 mg Oral Daily   feeding supplement  237 mL Oral BID BM   Glycopyrrolate-Formoterol  2 puff Inhalation BID   insulin aspart  0-5 Units Subcutaneous QHS   insulin aspart  0-9 Units Subcutaneous TID WC   mupirocin ointment  1 application Nasal BID   pantoprazole  20 mg Oral Daily   senna  1 tablet Oral BID   Continuous Infusions:  sodium chloride 75 mL/hr (11/19/20 1830)   methocarbamol (ROBAXIN) IV       LOS:  4 days    Time spent: 25 minutes    Tresa Moore, MD [facility] Pager [phone]  If 7PM-7AM, please contact night-coverage 11/20/2020, 1:55 PM

## 2020-11-20 NOTE — Evaluation (Signed)
Occupational Therapy Evaluation Patient Details Name: Angelica Ferguson MRN: [id number] DOB: 18-Mar-1933 Today's Date: 11/20/2020    History of Present Illness 87 y.o. female with a history of Afib, HTN, HLD, DM, and GERD who fell last Wednesday at home. Patient is visiting her daughter from [place].  Patient has had increasing pain over the past week.  She has not been able to stand or ambulate since the fall.  Patient was diagnosed with a right femoral neck hip fracture by x-ray upon arrival to ED. Now s/p R hemiarthroplasty 7/28, WBAT, posterior total hip precautions.   Clinical Impression   Pt seen for OT evaluation this date, POD#1 from above surgery. Daughter present and actively engaged throughout. Per pt/dtr, pt has been staying with her daughter since November, 2021 and dtr reports that the pt will continue to live with her; no plans to return to [place]. Per pt/dtr, pt was ambulating independently prior to the fall, no other falls in past 7mo, and only required assist for tub transfers from daughter and min assist for bathing. (Dtr has an Oceanographer seat that lowers/raises the pt in the rub, as she prefers tub baths). Dtr has set up home to suite the pt's needs, including a chair lift to 2nd floor, elevated toilet with rails, and the modified tub. Pt also has the ability to stay on the main floor as needed. Pt is eager to return to PLOF with less pain and improved safety and independence.   Pt currently requires MOD-MAX A for bed mobility, MAX A for LB dressing and bathing, and required MAX+ assist to attempt standing but was ultimately too pain limited to fully complete. Pt was motivated to participate throughout, very positive and eager to recover. [place] applied for mobility efforts, as she was 87-89% on room air at start of session, up to 96% at end of session on room air. Pt/dtr instructed in posterior total hip precautions and how to maintain during ADL/mobility, self care skills, falls  prevention strategies, home/routines modifications, DME/AE for LB bathing and dressing tasks, and compression stocking mgt strategies. Handout provided to support recall and carryover. Given high level of independence prior to fall and excellent home set up and support available (dtr plans to provide as much assist as needed), recommend high intensity skilled OT services, CIR upon discharge.      Follow Up Recommendations  CIR    Equipment Recommendations  Other (comment) (2WW)    Recommendations for Other Services       Precautions / Restrictions Precautions Precautions: Posterior Hip;Fall Precaution Booklet Issued: Yes (comment) Restrictions Weight Bearing Restrictions: Yes RLE Weight Bearing: Weight bearing as tolerated      Mobility Bed Mobility Overal bed mobility: Needs Assistance Bed Mobility: Supine to Sit;Sit to Supine     Supine to sit: Mod assist;Max assist;HOB elevated Sit to supine: Max assist   General bed mobility comments: cues for sequencing to maintain precautions    Transfers Overall transfer level: Needs assistance Equipment used: Rolling walker (2 wheeled) Transfers: Sit to/from Stand;Lateral/Scoot Transfers Sit to Stand: Max assist;From elevated surface        Lateral/Scoot Transfers: Max assist;From elevated surface General transfer comment: ultimately attempted to stand x2 and unable to clear buttocks 2/2 pain, Max A for 3 lateral scoots EOB    Balance Overall balance assessment: Needs assistance Sitting-balance support: Feet supported;Bilateral upper extremity supported Sitting balance-Leahy Scale: Fair Sitting balance - Comments: initially poor requiring CGA-Min A, improving with time to SBA and UE-BUE  support   Standing balance support: Bilateral upper extremity supported Standing balance-Leahy Scale: Zero Standing balance comment: too pain limited to stand ultimately                           ADL either performed or assessed  with clinical judgement   ADL Overall ADL's : Needs assistance/impaired                                       General ADL Comments: Pt currently requires MAX A for bed level toileting, MAX A for seated LB dressing involving lateral leans, and set up and PRN Min A for seated UB ADL. Set up and SBA for seated grooming tasks. Primarily pain limited with movement of RLE.     Vision Patient Visual Report: No change from baseline       Perception     Praxis      Pertinent Vitals/Pain Pain Assessment: 0-10 Pain Score: 9  Pain Location: with movement and standing attempt Pain Descriptors / Indicators: Aching;Guarding;Moaning;Grimacing Pain Intervention(s): Limited activity within patient's tolerance;Monitored during session;Premedicated before session;Repositioned;Ice applied     Hand Dominance Right   Extremity/Trunk Assessment Upper Extremity Assessment Upper Extremity Assessment: Generalized weakness   Lower Extremity Assessment Lower Extremity Assessment: Generalized weakness;RLE deficits/detail RLE Deficits / Details: s/p hemiarthroplasty, pain limited RLE: Unable to fully assess due to pain       Communication Communication Communication: HOH   Cognition Arousal/Alertness: Awake/alert Behavior During Therapy: WFL for tasks assessed/performed Overall Cognitive Status: Within Functional Limits for tasks assessed                                 General Comments: a bit groggy but awakes for session   General Comments       Exercises Other Exercises Other Exercises: Pt/dtr instructed in posterior hip precautions and how to maintain durnig bed mobility, ADL, and ADL transfers, RW mgt, falls prevention, AE/DME, and home/routines modifications; handout provided. Pt also encouraged to optimize pain control with nursing to support her recovery, dtr encouraged as well.   Shoulder Instructions      Home Living Family/patient expects to be  discharged to:: Private residence Living Arrangements: Children (pt now lives with daughter and daughter's partner) Available Help at Discharge: Family;Available 24 hours/day Type of Home: House Home Access: Stairs to enter [employer] of Steps: 3 Entrance Stairs-Rails: Left;Right Home Layout: Two level;Able to live on main level with bedroom/bathroom Alternate Level Stairs-Number of Steps: has chair lift; 2nd floor set up as patient's living area with all needs accessible; dtr reports pt is able to stay on main floor of the home as needed with all needs accessible       Bathroom Toilet: Handicapped height (+ riser w/ rails)     Home Equipment: Cane - single point;Toilet riser;Other (comment);Bedside commode;Shower seat   Additional Comments: shower chair electronically lowers pt into the tub and raises her up so pt can take baths with daughter's assist      Prior Functioning/Environment Level of Independence: Needs assistance  Gait / Transfers Assistance Needed: pt was ambulating without AD or assist prior to fall, able to get up main steps in home and then took chair lift up to 2nd floor ADL's / Homemaking Assistance Needed: pt indep with  basic ADL except had assist from dtr for tub transfers and bathing; family assist for transportation, medication mgt, meals, and cleaning   Comments: No additional falls.        OT Problem List: Decreased strength;Pain;Decreased range of motion;Decreased activity tolerance;Impaired balance (sitting and/or standing);Decreased knowledge of use of DME or AE;Decreased knowledge of precautions      OT Treatment/Interventions: Self-care/ADL training;Therapeutic exercise;Therapeutic activities;DME and/or AE instruction;Patient/family education;Balance training    OT Goals(Current goals can be found in the care plan section) Acute Rehab OT Goals Patient Stated Goal: get back to being independent OT Goal Formulation: With  patient/family Time For Goal Achievement: 12/04/20 Potential to Achieve Goals: Good  OT Frequency:     Barriers to D/C:    excellent home set up and support from family available       Co-evaluation              AM-PAC OT "6 Clicks" Daily Activity     Outcome Measure Help from another person eating meals?: None Help from another person taking care of personal grooming?: A Little Help from another person toileting, which includes using toliet, bedpan, or urinal?: A Lot Help from another person bathing (including washing, rinsing, drying)?: A Lot Help from another person to put on and taking off regular upper body clothing?: A Little Help from another person to put on and taking off regular lower body clothing?: A Lot 6 Click Score: 16   End of Session Equipment Utilized During Treatment: Gait belt;Rolling walker;Oxygen Nurse Communication: Mobility status  Activity Tolerance: Patient tolerated treatment well;Patient limited by pain Patient left: in bed;with call bell/phone within reach;with bed alarm set;with nursing/sitter in room;Other (comment);with SCD's reapplied (pillows between legs, rolled towels under ankles)  OT Visit Diagnosis: Other abnormalities of gait and mobility (R26.89);Muscle weakness (generalized) (M62.81);Pain;History of falling (Z91.81) Pain - Right/Left: Right Pain - part of body: Hip (and buttocks)                Time: 7824-2353 OT Time Calculation (min): 67 min Charges:  OT General Charges $OT Visit: 1 Visit OT Evaluation $OT Eval Moderate Complexity: 1 Mod OT Treatments $Self Care/Home Management : 53-67 mins  Wynona Canes, MPH, MS, OTR/L ascom [phone] 11/20/20, 3:54 PM

## 2020-11-20 NOTE — TOC Progression Note (Signed)
Transition of Care [facility]) - Progression Note    Patient Details  Name: Angelica Ferguson MRN: [id number] Date of Birth: May 29, 1932  [facility] CM/SW Contact  Barrie Dunker, RN Phone Number: 11/20/2020, 4:23 PM  Clinical Narrative:     Sherron Monday with Verlon Au at [facility], the cost for self pay PT/OT is 375 per day and they collect 2 weeks up front, awaiting bed offers       Expected Discharge Plan and Services                                                 Social Determinants of Health (SDOH) Interventions    Readmission Risk Interventions No flowsheet data found.

## 2020-11-20 NOTE — Evaluation (Signed)
Physical Therapy Evaluation Patient Details Name: Angelica Ferguson MRN: [id number] DOB: Sep 01, 1932 Today's Date: 11/20/2020   History of Present Illness  Angelica Ferguson is an 87yoF who comes to [facility] on 7/25 after fall on Wed 7/20, found to have hip fracture. Pt is a [employer], here since November '21 living with DTR, LT plan to relocate here permanenetly. Pt went to OR on 7/28 c Dr. Juanell Fairly for Rt hemi hip arthroplasty, posterolateral approach. PMH: AF on edoxaban, CAD s/p PCI, HTN, NIDDM2, GERD. Pt has posterior hip precautions and is WBAT.  Clinical Impression  Pt admitted with above diagnosis. Pt currently with functional limitations due to the deficits listed below (see "PT Problem List"). Upon entry, pt in bed, asleep, O2  doffed (86% SpO2), easily made awake and agreeable to participate. Pt is drowsy throughout, but remains awake with stimulation. Pt is weak in general, poor activation of BLE, Right worse than Left. Max-total A for bed mobility. Pt makes great efforts to come to standing, but pain inhibits ability to achieve full upright.  Baseline/PLOF taken from DTR earlier in day by OT. Plan is for pt to return to DTRs hom eventually. CIR may provide an opportunity to regain independence in mobility if they are able to take on the patient as a charity case. Patient's performance this date reveals decreased ability, independence, and tolerance in performing all basic mobility required for performance of activities of daily living. Pt requires additional DME, close physical assistance, and cues for safe participate in mobility. Pt will benefit from skilled PT intervention to increase independence and safety with basic mobility in preparation for discharge to the venue listed below.        Follow Up Recommendations CIR    Equipment Recommendations   (to be detemined)    Recommendations for Other Services       Precautions / Restrictions Precautions Precautions: Posterior  Hip;Fall Precaution Booklet Issued: Yes (comment) Restrictions Weight Bearing Restrictions: Yes RLE Weight Bearing: Weight bearing as tolerated      Mobility  Bed Mobility Overal bed mobility: Needs Assistance Bed Mobility: Supine to Sit;Sit to Supine     Supine to sit: HOB elevated;Max assist Sit to supine: +2 for physical assistance;Total assist;HOB elevated   General bed mobility comments: generally weak so to speak    Transfers Overall transfer level: Needs assistance Equipment used: None Transfers: Sit to/from Stand Sit to Stand: Max assist        Lateral/Scoot Transfers: Max assist;From elevated surface General transfer comment: 75% standing, but attempt to load operative leg results in dissolve of resolve.  Ambulation/Gait Ambulation/Gait assistance:  (Painful decline, returns to supine)              Stairs            Wheelchair Mobility    Modified Rankin (Stroke Patients Only)       Balance Overall balance assessment: Needs assistance Sitting-balance support: Feet supported;Bilateral upper extremity supported Sitting balance-Leahy Scale: Fair Sitting balance - Comments: initially poor requiring CGA-Min A, improving with time to SBA and UE-BUE support   Standing balance support: Bilateral upper extremity supported Standing balance-Leahy Scale: Zero Standing balance comment: too pain limited to stand ultimately                             Pertinent Vitals/Pain Pain Assessment: Faces Pain Score: 9  Faces Pain Scale: Hurts whole lot Pain Location: standing attempt Pain  Descriptors / Indicators: Aching;Guarding;Moaning;Grimacing Pain Intervention(s): Limited activity within patient's tolerance;Monitored during session    Home Living Family/patient expects to be discharged to:: Private residence Living Arrangements: Children (pt now lives with daughter and daughter's partner) Available Help at Discharge: Family;Available 24  hours/day Type of Home: House Home Access: Stairs to enter Entrance Stairs-Rails: Lawyer of Steps: 3 Home Layout: Two level;Able to live on main level with bedroom/bathroom Home Equipment: Gilmer Mor - single point;Toilet riser;Other (comment);Bedside commode;Shower seat Additional Comments: shower chair electronically lowers pt into the tub and raises her up so pt can take baths with daughter's assist    Prior Function Level of Independence: Needs assistance   Gait / Transfers Assistance Needed: pt was ambulating without AD or assist prior to fall, able to get up main steps in home and then took chair lift up to 2nd floor  ADL's / Homemaking Assistance Needed: pt indep with basic ADL except had assist from dtr for tub transfers and bathing; family assist for transportation, medication mgt, meals, and cleaning  Comments: No additional falls.     Hand Dominance   Dominant Hand: Right    Extremity/Trunk Assessment   Upper Extremity Assessment Upper Extremity Assessment: Generalized weakness    Lower Extremity Assessment Lower Extremity Assessment: Generalized weakness;RLE deficits/detail RLE Deficits / Details: s/p hemiarthroplasty, pain limited RLE:  (unable to SAQ, mod-maxA required for ABDCT slides in bed)       Communication   Communication: HOH  Cognition Arousal/Alertness: Awake/alert Behavior During Therapy: WFL for tasks assessed/performed Overall Cognitive Status: Within Functional Limits for tasks assessed                                 General Comments: per DTR very minimal signs of cognitive decline.      General Comments      Exercises Total Joint Exercises Short Arc Quad: AROM;Right;Supine;Both;Limitations;10 reps Short Arc Quad Limitations: unable to acvated, perform on Right; requires constant tactile cues to perform on Left despite adeuqate strength Hip ABduction/ADduction: AAROM;Both;15  reps;Supine;Limitations Hip Abduction/Adduction Limitations: similar to above Other Exercises Other Exercises: Pt/dtr instructed in posterior hip precautions and how to maintain durnig bed mobility, ADL, and ADL transfers, RW mgt, falls prevention, AE/DME, and home/routines modifications; handout provided. Pt also encouraged to optimize pain control with nursing to support her recovery, dtr encouraged as well.   Assessment/Plan    PT Assessment Patient needs continued PT services  PT Problem List Decreased strength;Decreased range of motion;Decreased activity tolerance;Decreased balance;Decreased mobility;Decreased knowledge of use of DME;Decreased safety awareness;Decreased knowledge of precautions       PT Treatment Interventions DME instruction;Gait training;Functional mobility training;Therapeutic activities;Therapeutic exercise;Patient/family education;Balance training;Neuromuscular re-education    PT Goals (Current goals can be found in the Care Plan section)  Acute Rehab PT Goals Patient Stated Goal: get back to being independent PT Goal Formulation: With patient Time For Goal Achievement: 12/04/20 Potential to Achieve Goals: Good    Frequency BID   Barriers to discharge        Co-evaluation               AM-PAC PT "6 Clicks" Mobility  Outcome Measure Help needed turning from your back to your side while in a flat bed without using bedrails?: Total Help needed moving from lying on your back to sitting on the side of a flat bed without using bedrails?: Total Help needed moving to and from a  bed to a chair (including a wheelchair)?: Total Help needed standing up from a chair using your arms (e.g., wheelchair or bedside chair)?: Total Help needed to walk in hospital room?: Total Help needed climbing 3-5 steps with a railing? : Total 6 Click Score: 6    End of Session Equipment Utilized During Treatment: Oxygen Activity Tolerance: Patient limited by pain;Patient  limited by lethargy Patient left: in bed;with call bell/phone within reach;with bed alarm set;with SCD's reapplied   PT Visit Diagnosis: Unsteadiness on feet (R26.81);Difficulty in walking, not elsewhere classified (R26.2);Other abnormalities of gait and mobility (R26.89);Muscle weakness (generalized) (M62.81)    Time: 8242-3536 PT Time Calculation (min) (ACUTE ONLY): 32 min   Charges:   PT Evaluation $PT Eval Low Complexity: 1 Low PT Treatments $Therapeutic Exercise: 8-22 mins       4:09 PM, 11/20/20 Rosamaria Lints, PT, DPT Physical Therapist - [facility]  [phone] (ASCOM)    Amaziah Raisanen C 11/20/2020, 3:58 PM

## 2020-11-20 NOTE — TOC Progression Note (Signed)
Transition of Care [facility]) - Progression Note    Patient Details  Name: Angelica Ferguson MRN: [id number] Date of Birth: Dec 22, 1932  [facility] of [facility]) CM/SW Contact  Barrie Dunker, RN Phone Number: 11/20/2020, 4:03 PM  Clinical Narrative:     Coralyn Pear out to Britta Mccreedy at [facility] requesting them to look at a patient to review and make a bed offer for a [facility] patient       Expected Discharge Plan and Services                                                 Social Determinants of Health (SDOH) Interventions    Readmission Risk Interventions No flowsheet data found.

## 2020-11-21 ENCOUNTER — Inpatient Hospital Stay: Payer: Medicaid Other

## 2020-11-21 LAB — CBC
HCT: 31 % — ABNORMAL LOW (ref 36.0–46.0)
Hemoglobin: 9.5 g/dL — ABNORMAL LOW (ref 12.0–15.0)
MCH: 27.6 pg (ref 26.0–34.0)
MCHC: 30.6 g/dL (ref 30.0–36.0)
MCV: 90.1 fL (ref 80.0–100.0)
Platelets: 347 10*3/uL (ref 150–400)
RBC: 3.44 MIL/uL — ABNORMAL LOW (ref 3.87–5.11)
RDW: 16 % — ABNORMAL HIGH (ref 11.5–15.5)
WBC: 21.7 10*3/uL — ABNORMAL HIGH (ref 4.0–10.5)
nRBC: 0 % (ref 0.0–0.2)

## 2020-11-21 LAB — BASIC METABOLIC PANEL
Anion gap: 11 (ref 5–15)
BUN: 29 mg/dL — ABNORMAL HIGH (ref 8–23)
CO2: 26 mmol/L (ref 22–32)
Calcium: 9.2 mg/dL (ref 8.9–10.3)
Chloride: 98 mmol/L (ref 98–111)
Creatinine, Ser: 0.63 mg/dL (ref 0.44–1.00)
GFR, Estimated: >60 mL/min (ref >60)
Glucose, Bld: 129 mg/dL — ABNORMAL HIGH (ref 70–99)
Potassium: 4.2 mmol/L (ref 3.5–5.1)
Sodium: 135 mmol/L (ref 135–145)

## 2020-11-21 LAB — GLUCOSE, CAPILLARY
Glucose-Capillary: 119 mg/dL — ABNORMAL HIGH (ref 70–99)
Glucose-Capillary: 182 mg/dL — ABNORMAL HIGH (ref 70–99)
Glucose-Capillary: 141 mg/dL — ABNORMAL HIGH (ref 70–99)
Glucose-Capillary: 142 mg/dL — ABNORMAL HIGH (ref 70–99)

## 2020-11-21 MED ORDER — SENNOSIDES-DOCUSATE SODIUM 8.6-50 MG PO TABS
1.0000 | ORAL_TABLET | Freq: Two times a day (BID) | ORAL | Status: DC
  Administered 2020-11-21 – 2020-11-22 (×4): 1 via ORAL
  Filled 2020-11-21 (×6): qty 1

## 2020-11-21 MED ORDER — TRAMADOL HCL 50 MG PO TABS
50.0000 mg | ORAL_TABLET | Freq: Four times a day (QID) | ORAL | Status: DC | PRN
  Administered 2020-11-21 – 2020-11-23 (×3): 50 mg via ORAL
  Filled 2020-11-21 (×3): qty 1

## 2020-11-21 NOTE — Progress Notes (Signed)
Inpatient Rehab Admissions Coordinator Note:   Per therapy recommendations, pt was screened for CIR candidacy by Megan Salon, MS CCC-SLP. At this time, Pt. Appears to have functional decline and is a potential candidate for CIR. Note that TOC requested charity bed, which CIR does not offer. Pt. Would have to be admitted as self-pay.   Megan Salon, MS, CCC-SLP Rehab Admissions Coordinator  [phone] (celll) [phone] (office)

## 2020-11-21 NOTE — Progress Notes (Signed)
Occupational Therapy Treatment Patient Details Name: Angelica Ferguson MRN: [id number] DOB: 1933/03/17 Today's Date: 11/21/2020    History of present illness Angelica Ferguson is an 34yoF who comes to [facility] on 7/25 after fall on Wed 7/20, found to have hip fracture. Pt is a [employer], here since November '21 living with DTR, LT plan to relocate here permanenetly. Pt went to OR on 7/28 c Dr. Thornton Park for Rt hemi hip arthroplasty, posterolateral approach. PMH: AF on edoxaban, CAD s/p PCI, HTN, NIDDM2, GERD. Pt has posterior hip precautions and is WBAT.   OT comments  Pt seen for OT tx this date to f/u re: safety with ADLs/ADL mobility. Pt endorsing fatigue from PT session. OT encouraging pt to sit EOB for meal time but pt wanting to eat in bed so compromised on chair position in bed. Pt does come to EOB sitting with MIN A with HOB elevated. Noted to be very close to foot of bed as RN and OT had been rolling pt to adjust sacral foam just prior. Attempted to engage pt in STS with RW and with HHA, but to no avail as pt only minimally able to clear her bottom from surface. Opted to use lateral scoot transfer to her Right to get closer to [facility] to optimize positioning. Pt then requires MOD/MAX A to return to supine for assist managing R LE. Pt repositioned and pt as well as her dtr who is present throughout are educated on chair position benefits as well as how to use bed controls to achieve this. Pt and dtr with good understanding. Pt left with all needs met and in reach, CNA notified pt will require a new pure-wick. Will continue to follow. Continue to Anticipate that pt will require extensive rehabilitation to return her to PLOF given current fxl status.    Follow Up Recommendations  CIR    Equipment Recommendations  Other (comment) (2ww)    Recommendations for Other Services      Precautions / Restrictions Precautions Precautions: Posterior Hip;Fall Restrictions Weight Bearing  Restrictions: Yes RLE Weight Bearing: Weight bearing as tolerated       Mobility Bed Mobility Overal bed mobility: Needs Assistance Bed Mobility: Supine to Sit;Sit to Supine     Supine to sit: Min assist Sit to supine: Mod assist;Max assist   General bed mobility comments: increased assist for back to bed to manage op leg    Transfers Overall transfer level: Needs assistance Equipment used: Rolling walker (2 wheeled);1 person hand held assist Transfers: Sit to/from Stand Sit to Stand: Max assist;From elevated surface        Lateral/Scoot Transfers: Mod assist;From elevated surface General transfer comment: pt with limited ability to clear her bottom from the bed despite max effort on her behalf and from therapist. Pt reports being fatigued from PT session. So  opted for lateral scoot at bed side to optimize positioning toward [place] in prep to return to supine    Balance Overall balance assessment: Needs assistance Sitting-balance support: Feet supported;Bilateral upper extremity supported Sitting balance-Leahy Scale: Good Sitting balance - Comments: initially poor requiring CGA-Min A, improving with time to SBA and UE-BUE support   Standing balance support: Bilateral upper extremity supported Standing balance-Leahy Scale: Poor Standing balance comment: requires MAX A and cannot completely clear surface to come to full stand, endorses fatigue from PT session imacting her ability  ADL either performed or assessed with clinical judgement   ADL                                               Vision Patient Visual Report: No change from baseline     Perception     Praxis      Cognition Arousal/Alertness: Awake/alert Behavior During Therapy: WFL for tasks assessed/performed Overall Cognitive Status: History of cognitive impairments - at baseline                                 General Comments: per  DTR very minimal signs of cognitive decline.        Exercises Exercises: Total Joint Total Joint Exercises Quad Sets: Strengthening;10 reps Short Arc Quad: Strengthening;AROM;10 reps Heel Slides: AROM;10 reps (with lightly resisted leg ext) Hip ABduction/ADduction: Strengthening;10 reps;AROM Straight Leg Raises: AAROM;5 reps Other Exercises Other Exercises: OT ed re: role of OT, posterior hip precautions r/t getting into/OOB as well as rolling, chair position in the bed to optimize for meals   Shoulder Instructions       General Comments      Pertinent Vitals/ Pain       Pain Assessment: Faces Faces Pain Scale: Hurts little more Pain Location: with stand attempts and lateral scoots Pain Descriptors / Indicators: Aching;Guarding;Moaning;Grimacing Pain Intervention(s): Limited activity within patient's tolerance;Monitored during session;Repositioned  Home Living                                          Prior Functioning/Environment              Frequency  Min 2X/week        Progress Toward Goals  OT Goals(current goals can now be found in the care plan section)  Progress towards OT goals: Progressing toward goals  Acute Rehab OT Goals Patient Stated Goal: get back to being independent OT Goal Formulation: With patient/family Time For Goal Achievement: 12/04/20 Potential to Achieve Goals: Good  Plan Discharge plan remains appropriate    Co-evaluation                 AM-PAC OT "6 Clicks" Daily Activity     Outcome Measure   Help from another person eating meals?: None Help from another person taking care of personal grooming?: A Little Help from another person toileting, which includes using toliet, bedpan, or urinal?: A Lot Help from another person bathing (including washing, rinsing, drying)?: A Lot Help from another person to put on and taking off regular upper body clothing?: A Little Help from another person to put on and  taking off regular lower body clothing?: A Lot 6 Click Score: 16    End of Session Equipment Utilized During Treatment: Rolling walker;Oxygen;Gait belt  OT Visit Diagnosis: Other abnormalities of gait and mobility (R26.89);Muscle weakness (generalized) (M62.81);Pain;History of falling (Z91.81) Pain - Right/Left: Right Pain - part of body: Hip   Activity Tolerance Patient tolerated treatment well;Patient limited by pain   Patient Left in bed;with call bell/phone within reach;with bed alarm set;Other (comment) (in chair position)   Nurse Communication Mobility status;Other (comment) (told CNA via phone that pt needs new purewick)  Time: 1747-1595 OT Time Calculation (min): 33 min  Charges: OT General Charges $OT Visit: 1 Visit OT Treatments $Therapeutic Activity: 23-37 mins  Gerrianne Scale, [place], OTR/L ascom [phone] 11/21/20, 6:07 PM

## 2020-11-21 NOTE — Progress Notes (Signed)
Subjective:  POD #2 s/p right hip hemiarthroplasty.   Patient reports right hip pain as mild.  Patient is more alert today.  Her daughter is at the bedside.  Patient has a white count of 21.7.    Objective:   VITALS:   Vitals:   11/20/20 1547 11/20/20 2119 11/21/20 0409 11/21/20 0751  BP: 135/72 139/70 (!) 124/54 (!) 120/57  Pulse: (!) 108 (!) 107 99 97  Resp: 14 16 16 16   Temp: (!) 97.5 F (36.4 C) 98.9 F (37.2 C) 99.5 F (37.5 C) 98.5 F (36.9 C)  TempSrc:  Oral Oral Oral  SpO2: 100% 92% 91% 96%  Weight:      Height:        PHYSICAL EXAM: Right lower extremity Neurovascular intact Sensation intact distally Intact pulses distally Dorsiflexion/Plantar flexion intact Aquacel dressing: scant drainage No cellulitis present Compartment soft  LABS  Results for orders placed or performed during the hospital encounter of 11/16/20 (from the past 24 hour(s))  Glucose, capillary     Status: Abnormal   Collection Time: 11/20/20 11:51 AM  Result Value Ref Range   Glucose-Capillary 135 (H) 70 - 99 mg/dL  Glucose, capillary     Status: None   Collection Time: 11/20/20  4:02 PM  Result Value Ref Range   Glucose-Capillary 98 70 - 99 mg/dL  Glucose, capillary     Status: Abnormal   Collection Time: 11/20/20  9:46 PM  Result Value Ref Range   Glucose-Capillary 148 (H) 70 - 99 mg/dL   Comment 1 Notify RN   CBC     Status: Abnormal   Collection Time: 11/21/20  5:42 AM  Result Value Ref Range   WBC 21.7 (H) 4.0 - 10.5 K/uL   RBC 3.44 (L) 3.87 - 5.11 MIL/uL   Hemoglobin 9.5 (L) 12.0 - 15.0 g/dL   HCT 11/23/20 (L) 26.8 - 34.1 %   MCV 90.1 80.0 - 100.0 fL   MCH 27.6 26.0 - 34.0 pg   MCHC 30.6 30.0 - 36.0 g/dL   RDW 96.2 (H) 22.9 - 79.8 %   Platelets 347 150 - 400 K/uL   nRBC 0.0 0.0 - 0.2 %  Basic metabolic panel     Status: Abnormal   Collection Time: 11/21/20  5:42 AM  Result Value Ref Range   Sodium 135 135 - 145 mmol/L   Potassium 4.2 3.5 - 5.1 mmol/L   Chloride 98 98 -  111 mmol/L   CO2 26 22 - 32 mmol/L   Glucose, Bld 129 (H) 70 - 99 mg/dL   BUN 29 (H) 8 - 23 mg/dL   Creatinine, Ser 11/23/20 0.44 - 1.00 mg/dL   Calcium 9.2 8.9 - 1.94 mg/dL   GFR, Estimated 17.4 >08 mL/min   Anion gap 11 5 - 15  Glucose, capillary     Status: Abnormal   Collection Time: 11/21/20  7:31 AM  Result Value Ref Range   Glucose-Capillary 119 (H) 70 - 99 mg/dL    DG Hip Port Unilat With Pelvis 1V Right  Result Date: 11/19/2020 CLINICAL DATA:  Status post arthroplasty. EXAM: DG HIP (WITH OR WITHOUT PELVIS) 1V PORT RIGHT COMPARISON:  None. FINDINGS: Right hip prosthesis appears to be well situated. Aspect of postoperative changes seen in the surrounding soft tissues. IMPRESSION: Status post right hip arthroplasty. Electronically Signed   By: 11/21/2020 M.D.   On: 11/19/2020 14:06    Assessment/Plan: 2 Days Post-Op   Active Problems:  Hip fracture (HCC)   Malnutrition of moderate degree  Patient more alert and conversive today.  She is less drowsy.  The patient's daughter explains that she underwent testing today for an infection work-up given her elevated white count.  It is possible that this is reactive.  Patient will continue with physical therapy.  She is from [place] and does not have [place] health insurance and therefore if she needs to go to rehab it will be an out-of-pocket expense.  Her daughter is aware of this as she spoke with the social worker yesterday.  Patient on edoxaban for atrial fibrillation which will cover her for DVT prophylaxis.    Juanell Fairly , MD 11/21/2020, 10:34 AM

## 2020-11-21 NOTE — Progress Notes (Signed)
Physical Therapy Treatment Patient Details Name: Angelica Ferguson MRN: [id number] DOB: June 01, 1932 Today's Date: 11/21/2020    History of Present Illness Angelica Ferguson is an 87yoF who comes to [facility] on 7/25 after fall on Wed 7/20, found to have hip fracture. Pt is a [employer], here since November '21 living with DTR, LT plan to relocate here permanenetly. Pt went to OR on 7/28 c Dr. Juanell Fairly for Rt hemi hip arthroplasty, posterolateral approach. PMH: AF on edoxaban, CAD s/p PCI, HTN, NIDDM2, GERD. Pt has posterior hip precautions and is WBAT.    PT Comments    Pt continues to be highly motivated and show great effort.  She did have more pain and less functional strength this afternoon in the R LE.  She was able to ambulate further than this AM, but needed more direct assist, had consistent R knee buckling (needing direct assist to maintain standing) and overall again despite great effort failed to show a lot of functional gains despite continued great attitude and effort.  Hoping to have continued to improve enough to be able to go home but clearly would not be safe to do so, will need some form of rehab at d/c.    Follow Up Recommendations  CIR     Equipment Recommendations  Rolling walker with 5" wheels    Recommendations for Other Services       Precautions / Restrictions Precautions Precautions: Posterior Hip;Fall Precaution Booklet Issued: Yes (comment) Restrictions Weight Bearing Restrictions: Yes RLE Weight Bearing: Weight bearing as tolerated    Mobility  Bed Mobility Overal bed mobility: Needs Assistance Bed Mobility: Sit to Supine     Supine to sit: Min assist Sit to supine: Mod assist   General bed mobility comments: increased assist for back to bed to manage op leg    Transfers Overall transfer level: Needs assistance Equipment used: Rolling walker (2 wheeled);1 person hand held assist Transfers: Sit to/from Stand Sit to Stand: Mod assist         Lateral/Scoot Transfers: Mod assist;From elevated surface General transfer comment: On multiple attempts this afternoon pt was able to initiate upward movmeent but ultimately lost balance backward and needed increasing assist to keep weight forward/upward moementum  Ambulation/Gait Ambulation/Gait assistance: Mod assist;Max assist Gait Distance (Feet): 40 Feet Assistive device: Rolling walker (2 wheeled)       General Gait Details: Pt again with great effort during ambulation, however she was unable to take weight through R LE this afternoon and consistently had buckling.  Consistent cuing (with little change in cadence) for extended R knee/WBing, L LE advancement, walker/UE use and overall despite the great effort she needed consistent assist to maintain standing.  Forward flexed posture, minimally corrected with cuing.   Stairs             Wheelchair Mobility    Modified Rankin (Stroke Patients Only)       Balance Overall balance assessment: Needs assistance Sitting-balance support: Feet supported;Bilateral upper extremity supported Sitting balance-Leahy Scale: Good Sitting balance - Comments: initially poor requiring CGA-Min A, improving with time to SBA and UE-BUE support   Standing balance support: Bilateral upper extremity supported Standing balance-Leahy Scale: Fair Standing balance comment: struggled to attain standing, inconsistent and with buckling during dynamic standing tasks                            Cognition Arousal/Alertness: Awake/alert Behavior During Therapy: [facility] for tasks assessed/performed  Overall Cognitive Status: History of cognitive impairments - at baseline                                 General Comments: per DTR very minimal signs of cognitive decline.      Exercises Total Joint Exercises Ankle Circles/Pumps: AROM;10 reps Quad Sets: Strengthening;15 reps Gluteal Sets: AROM;5 reps Short Arc Quad: AAROM;10  reps Heel Slides: AAROM;10 reps (with resitsed leg ext) Hip ABduction/ADduction: AAROM;10 reps (pt able to do AROM this AM, needed assist this afternoon) Straight Leg Raises: AAROM;5 reps Other Exercises Other Exercises: OT ed re: role of OT, posterior hip precautions r/t getting into/OOB as well as rolling, chair position in the bed to optimize for meals    General Comments        Pertinent Vitals/Pain Pain Assessment: Faces Faces Pain Scale: Hurts little more Pain Location: with stand attempts and lateral scoots Pain Descriptors / Indicators: Aching;Guarding;Moaning;Grimacing Pain Intervention(s): Limited activity within patient's tolerance;Monitored during session;Repositioned    Home Living                      Prior Function            PT Goals (current goals can now be found in the care plan section) Acute Rehab PT Goals PT Goal Formulation: With patient Time For Goal Achievement: 12/04/20 Potential to Achieve Goals: Fair Progress towards PT goals: Progressing toward goals    Frequency    BID      PT Plan Current plan remains appropriate    Co-evaluation              AM-PAC PT "6 Clicks" Mobility   Outcome Measure  Help needed turning from your back to your side while in a flat bed without using bedrails?: A Little Help needed moving from lying on your back to sitting on the side of a flat bed without using bedrails?: A Lot Help needed moving to and from a bed to a chair (including a wheelchair)?: A Lot Help needed standing up from a chair using your arms (e.g., wheelchair or bedside chair)?: A Lot Help needed to walk in hospital room?: A Lot Help needed climbing 3-5 steps with a railing? : Total 6 Click Score: 12    End of Session Equipment Utilized During Treatment: Gait belt Activity Tolerance: Patient limited by fatigue;Patient tolerated treatment well Patient left: with bed alarm set;with call bell/phone within reach;with  family/visitor present Nurse Communication: Mobility status PT Visit Diagnosis: Unsteadiness on feet (R26.81);Difficulty in walking, not elsewhere classified (R26.2);Other abnormalities of gait and mobility (R26.89);Muscle weakness (generalized) (M62.81)     Time: 3016-0109 PT Time Calculation (min) (ACUTE ONLY): 75 min  Charges:  $Gait Training: 8-22 mins $Therapeutic Exercise: 23-37 mins $Therapeutic Activity: 8-22 mins                     Malachi Pro, DPT 11/21/2020, 6:38 PM

## 2020-11-21 NOTE — Progress Notes (Signed)
Physical Therapy Treatment Patient Details Name: Angelica Ferguson MRN: [id number] DOB: 1933/02/04 Today's Date: 11/21/2020    History of Present Illness Angelica Ferguson is an 87yoF who comes to [facility] on 7/25 after fall on Wed 7/20, found to have hip fracture. Pt is a [employer], here since November '21 living with DTR, LT plan to relocate here permanenetly. Pt went to OR on 7/28 c Dr. Juanell Fairly for Rt hemi hip arthroplasty, posterolateral approach. PMH: AF on edoxaban, CAD s/p PCI, HTN, NIDDM2, GERD. Pt has posterior hip precautions and is WBAT.    PT Comments    Pt very pleasant t/o the session and with great overall effort.  She is still having pain and general weakness, but was much more able to appropriately participate with exercises, mobility and even managed ~25 ft of ambulation into the hallway.  She needed consistent cuing and close physical supervision but her need for direct assist was much less frequent and involved this date.  Discussion with daughter about managing at home, after today's effort this seems at least plausible, however some form of rehab is still be safer and more appropriate d/c plan.    Follow Up Recommendations  CIR     Equipment Recommendations  Rolling walker with 5" wheels    Recommendations for Other Services       Precautions / Restrictions Precautions Precautions: Posterior Hip;Fall Restrictions RLE Weight Bearing: Weight bearing as tolerated    Mobility  Bed Mobility Overal bed mobility: Needs Assistance Bed Mobility: Supine to Sit     Supine to sit: Min assist     General bed mobility comments: Pt showed great effort and though she did need some help with getting to sitting EOB, she did a bulk of the effort with only CGA/cuing and only light assist with transitioning torso up    Transfers Overall transfer level: Needs assistance Equipment used: Rolling walker (2 wheeled) Transfers: Sit to/from Stand Sit to Stand: Min  assist         General transfer comment: Pt initially hesitant to put a lot of weight through R LE, but did show great effort and with heavy UE use (and cuing to do so) she was able to rise from multiple surface heights with only minimal direct assist  Ambulation/Gait Ambulation/Gait assistance: Min assist Gait Distance (Feet): 25 Feet Assistive device: Rolling walker (2 wheeled)       General Gait Details: Pt showed great effort with ambulation and though she did fatigue relatively quickly with the effort after some initial hesitation she was able to use R LE/WBing relatively well.  Heavy verbal cuing and apart from a few small buckles  needing heavy phyiscal assist t/o the effort.   Stairs             Wheelchair Mobility    Modified Rankin (Stroke Patients Only)       Balance Overall balance assessment: Needs assistance Sitting-balance support: Feet supported;Bilateral upper extremity supported Sitting balance-Leahy Scale: Good     Standing balance support: Bilateral upper extremity supported Standing balance-Leahy Scale: Fair Standing balance comment: Pt able to stand on multiple occasions t/o the session with good overall ability to use walker to maintain standing and needing only CGA most of the time with occasional min assist during dynamic tasks                            Cognition Arousal/Alertness: Awake/alert Behavior During Therapy: [facility]  for tasks assessed/performed Overall Cognitive Status: History of cognitive impairments - at baseline                                        Exercises Total Joint Exercises Quad Sets: Strengthening;10 reps Short Arc Quad: Strengthening;AROM;10 reps Heel Slides: AROM;10 reps (with lightly resisted leg ext) Hip ABduction/ADduction: Strengthening;10 reps;AROM Straight Leg Raises: AAROM;5 reps    General Comments        Pertinent Vitals/Pain Pain Assessment:  (reports more pelvic pain  from being in bed than actual hip pain) Faces Pain Scale: Hurts little more    Home Living                      Prior Function            PT Goals (current goals can now be found in the care plan section) Progress towards PT goals: Progressing toward goals    Frequency    BID      PT Plan Current plan remains appropriate    Co-evaluation              AM-PAC PT "6 Clicks" Mobility   Outcome Measure  Help needed turning from your back to your side while in a flat bed without using bedrails?: A Little Help needed moving from lying on your back to sitting on the side of a flat bed without using bedrails?: A Little Help needed moving to and from a bed to a chair (including a wheelchair)?: A Lot Help needed standing up from a chair using your arms (e.g., wheelchair or bedside chair)?: A Lot Help needed to walk in hospital room?: A Lot Help needed climbing 3-5 steps with a railing? : Total 6 Click Score: 13    End of Session Equipment Utilized During Treatment: Gait belt Activity Tolerance: Patient limited by lethargy;Patient tolerated treatment well Patient left: with chair alarm set;with call bell/phone within reach;with family/visitor present Nurse Communication: Mobility status PT Visit Diagnosis: Unsteadiness on feet (R26.81);Difficulty in walking, not elsewhere classified (R26.2);Other abnormalities of gait and mobility (R26.89);Muscle weakness (generalized) (M62.81)     Time: 5631-4970 PT Time Calculation (min) (ACUTE ONLY): 50 min  Charges:  $Gait Training: 8-22 mins $Therapeutic Exercise: 8-22 mins $Therapeutic Activity: 8-22 mins                     Malachi Pro, DPT 11/21/2020, 3:34 PM

## 2020-11-21 NOTE — Progress Notes (Signed)
PROGRESS NOTE    Angelica Ferguson  AST:[id number] DOB: 1933-01-01 DOA: 11/16/2020 PCP: Vonna Kotyk, NP   Brief Narrative:   87 y.o. female with a past medical history of persistent atrial fibrillation on edoxaban, coronary artery disease with PCI, hypertension, non-insulin-dependent diabetes mellitus type 2, GERD.  The patient is visiting family from [place].  She had a mechanical fall last Wednesday.  Has been unable to ambulate and is still having right hip pain since.  Therefore came to the emergency department for further evaluation of the right hip pain.  Hemodynamics are stable.  Plain film imaging shows a right femoral neck fracture with varus angulation.  Orthopedics has been contacted.  Patient took her anticoagulant edoxaban on the day of admission in the morning.  As such surgery will have to be delayed.    Patient underwent right hip hemiarthroplasty on 7/28 with orthopedics.  Tolerated procedure well.  No immediate postoperative complications.   On postoperative day #2 White count is up to 21.7.  Has been increasing over past 48 hours.  Unclear etiology.  No clinical evidence of infection.  Infectious work-up in progress.   Assessment & Plan:   Active Problems:   Hip fracture (HCC)   Malnutrition of moderate degree  Right femoral neck fracture secondary to mechanical fall Patient presented 1 week after mechanical fall Had persistent and worsening pain Imaging significant for right femoral neck fracture with varus angulation Pain well controlled Orthopedics on consult Status post right hip hemiarthroplasty 7/28 Plan: Continue diet.  Multimodal pain control.  Continue home edoxaban for VT prophylaxis.  Bowel regimen.  Therapy evaluations as able.  Patient will need some form of outpatient therapy either skilled nursing facility or home with home health.  Daughter understands that patient will not have insurance coverage so any SNF stay will be  out-of-pocket.  Leukocytosis Increasing over past 48 hours since surgery Reactive versus postoperative infection Chest x-ray and KUB negative for acute infection Urinalysis ordered and pending Plan: Follow-up urinalysis, no convincing for infection may start antibiotics For now no antibiotics.  Monitor vitals and fever curve  Persistent atrial fibrillation Heart rate controlled Home edoxaban started postoperatively Continue home bisoprolol Continue home Cardizem As needed EKG for tachycardia or chest pain  Essential hypertension Home ramipril on hold.  Blood pressure controlled  Non-insulin-dependent diabetes mellitus No contrast exposure anticipated Metformin is been discontinued to avoid hypoglycemia Sliding scale coverage Carb modified diet  Coronary artery disease Hyperlipidemia Resume high-dose statin Resumed home edoxaban  GERD PTA Prevacid  Leukocytosis Suspect reactive in nature No evidence of infection  DVT prophylaxis: Edoxaban Code Status: Full Family Communication: Daughter at bedside 7/30 Disposition Plan: Status is: Inpatient  Remains inpatient appropriate because:Inpatient level of care appropriate due to severity of illness  Dispo: The patient is from: Home              Anticipated d/c is to: SNF versus home with home health              Patient currently is not medically stable to d/c.   Difficult to place patient No   Postoperative day #2 status post right hip hemiarthroplasty.  More awake this morning.  Leukocytosis of 21.7.  Reactive versus postoperative infection.  Work-up in progress.    Level of care: Med-Surg  Consultants:  Orthopedics  Procedures:  None  Antimicrobials:  None   Subjective: Patient seen and examined.  Postop day #2.  Resting comfortably in bed.  More awake this  morning.  Objective: Vitals:   11/20/20 1547 11/20/20 2119 11/21/20 0409 11/21/20 0751  BP: 135/72 139/70 (!) 124/54 (!) 120/57  Pulse: (!) 108  (!) 107 99 97  Resp: 14 16 16 16   Temp: (!) 97.5 F (36.4 C) 98.9 F (37.2 C) 99.5 F (37.5 C) 98.5 F (36.9 C)  TempSrc:  Oral Oral Oral  SpO2: 100% 92% 91% 96%  Weight:      Height:        Intake/Output Summary (Last 24 hours) at 11/21/2020 1334 Last data filed at 11/21/2020 1030 Gross per 24 hour  Intake 900 ml  Output 375 ml  Net 525 ml   Filed Weights   11/17/20 1541  Weight: 40.8 kg    Examination:  General exam: No acute distress.  Sitting up in bed.  Answers questions appropriately Respiratory system: Clear to auscultation. Respiratory effort normal. Cardiovascular system: S1-S2, regular rate and rhythm, no murmurs, no pedal edema  gastrointestinal system: Thin, soft, nontender, nondistended, normal bowel sounds Central nervous system: Alert and oriented. No focal neurological deficits. Extremities: Right hip surgical dressings clean dry and intact Skin: No rashes, lesions or ulcers Psychiatry: Judgement and insight appear normal. Mood & affect appropriate.     Data Reviewed: I have personally reviewed following labs and imaging studies  CBC: Recent Labs  Lab 11/16/20 1434 11/19/20 0350 11/20/20 0604 11/21/20 0542  WBC 13.8* 12.5* 17.9* 21.7*  NEUTROABS 9.8* 9.0*  --   --   HGB 11.9* 10.4* 9.7* 9.5*  HCT 37.5 32.7* 30.2* 31.0*  MCV 87.6 87.4 87.8 90.1  PLT 367 400 355 347   Basic Metabolic Panel: Recent Labs  Lab 11/16/20 1434 11/19/20 0350 11/20/20 0604 11/21/20 0542  NA 134* 135 135 135  K 4.4 4.8 4.2 4.2  CL 101 98 101 98  CO2 24 28 29 26   GLUCOSE 111* 121* 142* 129*  BUN 25* 45* 27* 29*  CREATININE 0.86 1.04* 0.80 0.63  CALCIUM 8.9 8.9 8.4* 9.2  MG  --  1.9  --   --    GFR: Estimated Creatinine Clearance: 33.1 mL/min (by C-G formula based on SCr of 0.63 mg/dL). Liver Function Tests: Recent Labs  Lab 11/16/20 1434  AST 27  ALT 21  ALKPHOS 104  BILITOT 1.0  PROT 6.5  ALBUMIN 3.1*   No results for input(s): LIPASE, AMYLASE in  the last 168 hours. No results for input(s): AMMONIA in the last 168 hours. Coagulation Profile: Recent Labs  Lab 11/16/20 1434 11/19/20 0350  INR 1.6* 1.1   Cardiac Enzymes: No results for input(s): CKTOTAL, CKMB, CKMBINDEX, TROPONINI in the last 168 hours. BNP (last 3 results) No results for input(s): PROBNP in the last 8760 hours. HbA1C: No results for input(s): HGBA1C in the last 72 hours.  CBG: Recent Labs  Lab 11/20/20 1151 11/20/20 1602 11/20/20 2146 11/21/20 0731 11/21/20 1136  GLUCAP 135* 98 148* 119* 182*   Lipid Profile: No results for input(s): CHOL, HDL, LDLCALC, TRIG, CHOLHDL, LDLDIRECT in the last 72 hours. Thyroid Function Tests: No results for input(s): TSH, T4TOTAL, FREET4, T3FREE, THYROIDAB in the last 72 hours. Anemia Panel: No results for input(s): VITAMINB12, FOLATE, FERRITIN, TIBC, IRON, RETICCTPCT in the last 72 hours. Sepsis Labs: No results for input(s): PROCALCITON, LATICACIDVEN in the last 168 hours.  Recent Results (from the past 240 hour(s))  Resp Panel by RT-PCR (Flu A&B, Covid) Nasopharyngeal Swab     Status: None   Collection Time: 11/16/20  2:34 PM  Specimen: Nasopharyngeal Swab; Nasopharyngeal(NP) swabs in vial transport medium  Result Value Ref Range Status   SARS Coronavirus 2 by RT PCR NEGATIVE NEGATIVE Final    Comment: (NOTE) SARS-CoV-2 target nucleic acids are NOT DETECTED.  The SARS-CoV-2 RNA is generally detectable in upper respiratory specimens during the acute phase of infection. The lowest concentration of SARS-CoV-2 viral copies this assay can detect is 138 copies/mL. A negative result does not preclude SARS-Cov-2 infection and should not be used as the sole basis for treatment or other patient management decisions. A negative result may occur with  improper specimen collection/handling, submission of specimen other than nasopharyngeal swab, presence of viral mutation(s) within the areas targeted by this assay, and  inadequate number of viral copies(<138 copies/mL). A negative result must be combined with clinical observations, patient history, and epidemiological information. The expected result is Negative.  Fact Sheet for Patients:  BloggerCourse.com  Fact Sheet for Healthcare Providers:  SeriousBroker.it  This test is no t yet approved or cleared by the [place] FDA and  has been authorized for detection and/or diagnosis of SARS-CoV-2 by FDA under an Emergency Use Authorization (EUA). This EUA will remain  in effect (meaning this test can be used) for the duration of the COVID-19 declaration under Section 564(b)(1) of the Act, 21 U.S.C.section 360bbb-3(b)(1), unless the authorization is terminated  or revoked sooner.       Influenza A by PCR NEGATIVE NEGATIVE Final   Influenza B by PCR NEGATIVE NEGATIVE Final    Comment: (NOTE) The Xpert Xpress SARS-CoV-2/FLU/RSV plus assay is intended as an aid in the diagnosis of influenza from Nasopharyngeal swab specimens and should not be used as a sole basis for treatment. Nasal washings and aspirates are unacceptable for Xpert Xpress SARS-CoV-2/FLU/RSV testing.  Fact Sheet for Patients: BloggerCourse.com  Fact Sheet for Healthcare Providers: SeriousBroker.it  This test is not yet approved or cleared by the [place] FDA and has been authorized for detection and/or diagnosis of SARS-CoV-2 by FDA under an Emergency Use Authorization (EUA). This EUA will remain in effect (meaning this test can be used) for the duration of the COVID-19 declaration under Section 564(b)(1) of the Act, 21 U.S.C. section 360bbb-3(b)(1), unless the authorization is terminated or revoked.  Performed at [facility], [address], [place], [place] [address]   Surgical PCR screen     Status: None   Collection Time: 11/17/20  4:45 AM   Specimen:  Nasal Mucosa; Nasal Swab  Result Value Ref Range Status   MRSA, PCR NEGATIVE NEGATIVE Final   Staphylococcus aureus NEGATIVE NEGATIVE Final    Comment: (NOTE) The Xpert SA Assay (FDA approved for NASAL specimens in patients 74 years of age and older), is one component of a comprehensive surveillance program. It is not intended to diagnose infection nor to guide or monitor treatment. Performed at [facility], [address], [place], [place] [address]          Radiology Studies: DG Abd 1 View  Result Date: 11/21/2020 CLINICAL DATA:  Status post fall. Hip fracture. Patient is now constipated. EXAM: ABDOMEN - 1 VIEW COMPARISON:  None. FINDINGS: Mild stool burden noted within the colon. No dilated loops of large or small bowel. Gas is noted up to the level of the rectum. Status post right hip arthroplasty. IMPRESSION: Nonobstructive bowel gas pattern. Electronically Signed   By: Signa Kell M.D.   On: 11/21/2020 11:42   DG Chest Port 1 View  Result Date: 11/21/2020  CLINICAL DATA:  Fall on 07/20.  Hip fracture. EXAM: PORTABLE CHEST 1 VIEW COMPARISON:  None. FINDINGS: Midline trachea. Normal heart size. Atherosclerosis in the transverse aorta. No pleural effusion or pneumothorax. Diffuse interstitial thickening with more confluent upper lobe reticulonodular opacities with volume loss and hilar retraction superiorly. Right base scarring. No well-defined lobar consolidation. 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 No convincing evidence of acute superimposed process. Comparison with prior radiographs would be informative if available. No posttraumatic deformity identified. Aortic Atherosclerosis (ICD10-I70.0). Electronically Signed   By: Jeronimo Greaves M.D.   On: 11/21/2020 11:43   DG Hip Port Unilat With Pelvis 1V Right  Result Date: 11/19/2020 CLINICAL DATA:  Status post arthroplasty. EXAM: DG HIP (WITH  OR WITHOUT PELVIS) 1V PORT RIGHT COMPARISON:  None. FINDINGS: Right hip prosthesis appears to be well situated. Aspect of postoperative changes seen in the surrounding soft tissues. IMPRESSION: Status post right hip arthroplasty. Electronically Signed   By: Lupita Raider M.D.   On: 11/19/2020 14:06        Scheduled Meds:  atorvastatin  80 mg Oral QPM   beclomethasone  1 puff Inhalation BID   bisoprolol  5 mg Oral BID   Chlorhexidine Gluconate Cloth  6 each Topical Daily   diltiazem  60 mg Oral BID   edoxaban  30 mg Oral Daily   feeding supplement  237 mL Oral BID BM   Glycopyrrolate-Formoterol  2 puff Inhalation BID   insulin aspart  0-5 Units Subcutaneous QHS   insulin aspart  0-9 Units Subcutaneous TID WC   mupirocin ointment  1 application Nasal BID   pantoprazole  20 mg Oral Daily   senna-docusate  1 tablet Oral BID   Continuous Infusions:  sodium chloride 75 mL/hr (11/19/20 1830)     LOS: 5 days    Time spent: 25 minutes    Tresa Moore, MD [facility] Pager [phone]  If 7PM-7AM, please contact night-coverage 11/21/2020, 1:34 PM

## 2020-11-22 LAB — CBC
HCT: 29 % — ABNORMAL LOW (ref 36.0–46.0)
Hemoglobin: 9.1 g/dL — ABNORMAL LOW (ref 12.0–15.0)
MCH: 27.7 pg (ref 26.0–34.0)
MCHC: 31.4 g/dL (ref 30.0–36.0)
MCV: 88.1 fL (ref 80.0–100.0)
Platelets: 360 10*3/uL (ref 150–400)
RBC: 3.29 MIL/uL — ABNORMAL LOW (ref 3.87–5.11)
RDW: 16 % — ABNORMAL HIGH (ref 11.5–15.5)
WBC: 17.1 10*3/uL — ABNORMAL HIGH (ref 4.0–10.5)
nRBC: 0 % (ref 0.0–0.2)

## 2020-11-22 LAB — URINALYSIS, COMPLETE (UACMP) WITH MICROSCOPIC
Bilirubin Urine: NEGATIVE
Glucose, UA: NEGATIVE mg/dL
Hgb urine dipstick: NEGATIVE
Ketones, ur: 5 mg/dL — AB
Nitrite: NEGATIVE
Protein, ur: 30 mg/dL — AB
Specific Gravity, Urine: 1.031 — ABNORMAL HIGH (ref 1.005–1.030)
pH: 5 (ref 5.0–8.0)

## 2020-11-22 LAB — GLUCOSE, CAPILLARY
Glucose-Capillary: 105 mg/dL — ABNORMAL HIGH (ref 70–99)
Glucose-Capillary: 183 mg/dL — ABNORMAL HIGH (ref 70–99)
Glucose-Capillary: 151 mg/dL — ABNORMAL HIGH (ref 70–99)
Glucose-Capillary: 127 mg/dL — ABNORMAL HIGH (ref 70–99)

## 2020-11-22 MED ORDER — POLYETHYLENE GLYCOL 3350 17 G PO PACK
17.0000 g | PACK | Freq: Every day | ORAL | Status: DC
  Administered 2020-11-22: 17 g via ORAL
  Filled 2020-11-22 (×2): qty 1

## 2020-11-22 MED ORDER — METHOCARBAMOL 500 MG PO TABS
500.0000 mg | ORAL_TABLET | Freq: Three times a day (TID) | ORAL | Status: DC | PRN
  Administered 2020-11-22: 500 mg via ORAL
  Filled 2020-11-22: qty 1

## 2020-11-22 NOTE — Progress Notes (Signed)
PROGRESS NOTE    Angelica PontoColleen Ferguson  ZHY:[id number] DOB: [birth year] DOA: 11/16/2020 PCP: Vonna KotykBlandford, Kristen K, NP   Brief Narrative:   87 y.o. female with a past medical history of persistent atrial fibrillation on edoxaban, coronary artery disease with PCI, hypertension, non-insulin-dependent diabetes mellitus type 2, GERD.  The patient is visiting family from [place].  She had a mechanical fall last Wednesday.  Has been unable to ambulate and is still having right hip pain since.  Therefore came to the emergency department for further evaluation of the right hip pain.  Hemodynamics are stable.  Plain film imaging shows a right femoral neck fracture with varus angulation.  Orthopedics has been contacted.  Patient took her anticoagulant edoxaban on the day of admission in the morning.  As such surgery will have to be delayed.    Patient underwent right hip hemiarthroplasty on 7/28 with orthopedics.  Tolerated procedure well.  No immediate postoperative complications.   On postoperative day #2 White count is up to 21.7.  Subsequently decreasing.  Chest x-ray and KUB negative for infection.  Urinalysis not overtly convincing for infection.   Assessment & Plan:   Active Problems:   Hip fracture (HCC)   Malnutrition of moderate degree  Right femoral neck fracture secondary to mechanical fall Patient presented 1 week after mechanical fall Had persistent and worsening pain Imaging significant for right femoral neck fracture with varus angulation Pain well controlled Orthopedics on consult Status post right hip hemiarthroplasty 7/28 Plan: Continue diet as tolerated.  Multimodal pain control.  Edoxaban for VTE prophylaxis.  Bowel regimen.  No bowel movement reported as of yet.  Therapy evaluations.  Patient's progress has been limited by postoperative pain.  Will likely need skilled nursing facility versus CIR.  Daughter aware and willing to pay out-of-pocket if this is the  recommendation.  Leukocytosis Increased on postoperative day 2, peaked at 21.7 No clinical evidence of infection Decreasing subsequently Chest x-ray and KUB negative Urinalysis not especially convincing for infection Plan: Hold antibiotics for now.  Monitor vitals and fever curve Low threshold to start antibiotic therapy  Persistent atrial fibrillation Heart rate controlled Home edoxaban started postoperatively Continue home bisoprolol Continue home Cardizem As needed EKG for tachycardia or chest pain  Essential hypertension Home ramipril on hold.  Blood pressure controlled  Non-insulin-dependent diabetes mellitus No contrast exposure anticipated Metformin is been discontinued to avoid hypoglycemia Sliding scale coverage Carb modified diet  Coronary artery disease Hyperlipidemia Resume high-dose statin Resumed home edoxaban  GERD PTA Prevacid  Leukocytosis Suspect reactive in nature No evidence of infection  DVT prophylaxis: Edoxaban Code Status: Full Family Communication: Daughter at bedside 7/30. 7/31 Disposition Plan: Status is: Inpatient  Remains inpatient appropriate because:Inpatient level of care appropriate due to severity of illness  Dispo: The patient is from: Home              Anticipated d/c is to: SNF              Patient currently is not medically stable to d/c.   Difficult to place patient No   Postoperative day #3 status post right hip hemiarthroplasty.  Leukocytosis downtrending.  Pending BM.  Possible medical readiness for discharge in 24 to 48 hours.    Level of care: Med-Surg  Consultants:  Orthopedics  Procedures:  None  Antimicrobials:  None   Subjective: Patient seen and examined.  Postoperative day #3.  Resting comfortably in bed.  Does endorse some postoperative pain though improving.  Objective: Vitals:  11/21/20 1830 11/21/20 2041 11/22/20 0338 11/22/20 0745  BP: (!) 126/59 (!) 116/58 128/66 133/71  Pulse: 78 87  81 69  Resp: 15 18 18 18   Temp: 98.4 F (36.9 C) 98.4 F (36.9 C) 98.8 F (37.1 C) 98.6 F (37 C)  TempSrc: Oral     SpO2: 98% 93% 93% 98%  Weight:      Height:        Intake/Output Summary (Last 24 hours) at 11/22/2020 1346 Last data filed at 11/22/2020 1030 Gross per 24 hour  Intake 360 ml  Output 500 ml  Net -140 ml   Filed Weights   11/17/20 1541  Weight: 40.8 kg    Examination:  General exam: No acute distress.  Answers questions appropriately Respiratory system: Clear to auscultation. Respiratory effort normal. Cardiovascular system: S1-S2, regular rate and rhythm, no murmurs, no pedal edema  gastrointestinal system: Thin, soft, nontender, nondistended, normal bowel sounds Central nervous system: Alert and oriented. No focal neurological deficits. Extremities: Right hip surgical dressing CDI.  Decreased range of motion.  Mild pain to palpation Skin: No rashes, lesions or ulcers Psychiatry: Judgement and insight appear normal. Mood & affect appropriate.     Data Reviewed: I have personally reviewed following labs and imaging studies  CBC: Recent Labs  Lab 11/16/20 1434 11/19/20 0350 11/20/20 0604 11/21/20 0542 11/22/20 0636  WBC 13.8* 12.5* 17.9* 21.7* 17.1*  NEUTROABS 9.8* 9.0*  --   --   --   HGB 11.9* 10.4* 9.7* 9.5* 9.1*  HCT 37.5 32.7* 30.2* 31.0* 29.0*  MCV 87.6 87.4 87.8 90.1 88.1  PLT 367 400 355 347 360   Basic Metabolic Panel: Recent Labs  Lab 11/16/20 1434 11/19/20 0350 11/20/20 0604 11/21/20 0542  NA 134* 135 135 135  K 4.4 4.8 4.2 4.2  CL 101 98 101 98  CO2 24 28 29 26   GLUCOSE 111* 121* 142* 129*  BUN 25* 45* 27* 29*  CREATININE 0.86 1.04* 0.80 0.63  CALCIUM 8.9 8.9 8.4* 9.2  MG  --  1.9  --   --    GFR: Estimated Creatinine Clearance: 33.1 mL/min (by C-G formula based on SCr of 0.63 mg/dL). Liver Function Tests: Recent Labs  Lab 11/16/20 1434  AST 27  ALT 21  ALKPHOS 104  BILITOT 1.0  PROT 6.5  ALBUMIN 3.1*   No  results for input(s): LIPASE, AMYLASE in the last 168 hours. No results for input(s): AMMONIA in the last 168 hours. Coagulation Profile: Recent Labs  Lab 11/16/20 1434 11/19/20 0350  INR 1.6* 1.1   Cardiac Enzymes: No results for input(s): CKTOTAL, CKMB, CKMBINDEX, TROPONINI in the last 168 hours. BNP (last 3 results) No results for input(s): PROBNP in the last 8760 hours. HbA1C: No results for input(s): HGBA1C in the last 72 hours.  CBG: Recent Labs  Lab 11/21/20 1136 11/21/20 1630 11/21/20 2128 11/22/20 0742 11/22/20 1131  GLUCAP 182* 141* 142* 105* 183*   Lipid Profile: No results for input(s): CHOL, HDL, LDLCALC, TRIG, CHOLHDL, LDLDIRECT in the last 72 hours. Thyroid Function Tests: No results for input(s): TSH, T4TOTAL, FREET4, T3FREE, THYROIDAB in the last 72 hours. Anemia Panel: No results for input(s): VITAMINB12, FOLATE, FERRITIN, TIBC, IRON, RETICCTPCT in the last 72 hours. Sepsis Labs: No results for input(s): PROCALCITON, LATICACIDVEN in the last 168 hours.  Recent Results (from the past 240 hour(s))  Resp Panel by RT-PCR (Flu A&B, Covid) Nasopharyngeal Swab     Status: None   Collection Time: 11/16/20  2:34 PM   Specimen: Nasopharyngeal Swab; Nasopharyngeal(NP) swabs in vial transport medium  Result Value Ref Range Status   SARS Coronavirus 2 by RT PCR NEGATIVE NEGATIVE Final    Comment: (NOTE) SARS-CoV-2 target nucleic acids are NOT DETECTED.  The SARS-CoV-2 RNA is generally detectable in upper respiratory specimens during the acute phase of infection. The lowest concentration of SARS-CoV-2 viral copies this assay can detect is 138 copies/mL. A negative result does not preclude SARS-Cov-2 infection and should not be used as the sole basis for treatment or other patient management decisions. A negative result may occur with  improper specimen collection/handling, submission of specimen other than nasopharyngeal swab, presence of viral mutation(s)  within the areas targeted by this assay, and inadequate number of viral copies(<138 copies/mL). A negative result must be combined with clinical observations, patient history, and epidemiological information. The expected result is Negative.  Fact Sheet for Patients:  BloggerCourse.com  Fact Sheet for Healthcare Providers:  SeriousBroker.it  This test is no t yet approved or cleared by the [place] FDA and  has been authorized for detection and/or diagnosis of SARS-CoV-2 by FDA under an Emergency Use Authorization (EUA). This EUA will remain  in effect (meaning this test can be used) for the duration of the COVID-19 declaration under Section 564(b)(1) of the Act, 21 U.S.C.section 360bbb-3(b)(1), unless the authorization is terminated  or revoked sooner.       Influenza A by PCR NEGATIVE NEGATIVE Final   Influenza B by PCR NEGATIVE NEGATIVE Final    Comment: (NOTE) The Xpert Xpress SARS-CoV-2/FLU/RSV plus assay is intended as an aid in the diagnosis of influenza from Nasopharyngeal swab specimens and should not be used as a sole basis for treatment. Nasal washings and aspirates are unacceptable for Xpert Xpress SARS-CoV-2/FLU/RSV testing.  Fact Sheet for Patients: BloggerCourse.com  Fact Sheet for Healthcare Providers: SeriousBroker.it  This test is not yet approved or cleared by the [place] FDA and has been authorized for detection and/or diagnosis of SARS-CoV-2 by FDA under an Emergency Use Authorization (EUA). This EUA will remain in effect (meaning this test can be used) for the duration of the COVID-19 declaration under Section 564(b)(1) of the Act, 21 U.S.C. section 360bbb-3(b)(1), unless the authorization is terminated or revoked.  Performed at [facility], [address], [place], [place] [address]   Surgical PCR screen     Status: None    Collection Time: 11/17/20  4:45 AM   Specimen: Nasal Mucosa; Nasal Swab  Result Value Ref Range Status   MRSA, PCR NEGATIVE NEGATIVE Final   Staphylococcus aureus NEGATIVE NEGATIVE Final    Comment: (NOTE) The Xpert SA Assay (FDA approved for NASAL specimens in patients 60 years of age and older), is one component of a comprehensive surveillance program. It is not intended to diagnose infection nor to guide or monitor treatment. Performed at [facility], [address] [place], [place] [address]   CULTURE, BLOOD (ROUTINE X 2) w Reflex to ID Panel     Status: None (Preliminary result)   Collection Time: 11/21/20  8:25 AM   Specimen: BLOOD  Result Value Ref Range Status   Specimen Description BLOOD BLOOD RIGHT HAND  Final   Special Requests   Final    BOTTLES DRAWN AEROBIC AND ANAEROBIC Blood Culture adequate volume   Culture   Final    NO GROWTH < 24 HOURS Performed at [facility], [address], [place], [place] [address]  Report Status PENDING  Incomplete  CULTURE, BLOOD (ROUTINE X 2) w Reflex to ID Panel     Status: None (Preliminary result)   Collection Time: 11/21/20  8:35 AM   Specimen: BLOOD  Result Value Ref Range Status   Specimen Description BLOOD BLOOD LEFT HAND  Final   Special Requests   Final    BOTTLES DRAWN AEROBIC AND ANAEROBIC Blood Culture adequate volume   Culture   Final    NO GROWTH < 24 HOURS Performed at [facility], [address] [place], [place] [address]    Report Status PENDING  Incomplete         Radiology Studies: DG Abd 1 View  Result Date: 11/21/2020 CLINICAL DATA:  Status post fall. Hip fracture. Patient is now constipated. EXAM: ABDOMEN - 1 VIEW COMPARISON:  None. FINDINGS: Mild stool burden noted within the colon. No dilated loops of large or small bowel. Gas is noted up to the level of the rectum. Status post right hip arthroplasty. IMPRESSION: Nonobstructive bowel gas pattern. Electronically  Signed   By: Signa Kell M.D.   On: 11/21/2020 11:42   DG Chest Port 1 View  Result Date: 11/21/2020 CLINICAL DATA:  Fall on 07/20.  Hip fracture. EXAM: PORTABLE CHEST 1 VIEW COMPARISON:  None. FINDINGS: Midline trachea. Normal heart size. Atherosclerosis in the transverse aorta. No pleural effusion or pneumothorax. Diffuse interstitial thickening with more confluent upper lobe reticulonodular opacities with volume loss and hilar retraction superiorly. Right base scarring. No well-defined lobar consolidation. 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 No convincing evidence of acute superimposed process. Comparison with prior radiographs would be informative if available. No posttraumatic deformity identified. Aortic Atherosclerosis (ICD10-I70.0). Electronically Signed   By: Jeronimo Greaves M.D.   On: 11/21/2020 11:43        Scheduled Meds:  atorvastatin  80 mg Oral QPM   beclomethasone  1 puff Inhalation BID   bisoprolol  5 mg Oral BID   Chlorhexidine Gluconate Cloth  6 each Topical Daily   diltiazem  60 mg Oral BID   edoxaban  30 mg Oral Daily   feeding supplement  237 mL Oral BID BM   Glycopyrrolate-Formoterol  2 puff Inhalation BID   insulin aspart  0-5 Units Subcutaneous QHS   insulin aspart  0-9 Units Subcutaneous TID WC   pantoprazole  20 mg Oral Daily   polyethylene glycol  17 g Oral Daily   senna-docusate  1 tablet Oral BID   Continuous Infusions:     LOS: 6 days    Time spent: 25 minutes    Tresa Moore, MD [facility] Pager [phone]  If 7PM-7AM, please contact night-coverage 11/22/2020, 1:46 PM

## 2020-11-22 NOTE — Evaluation (Signed)
Physical Therapy Evaluation Patient Details Name: Angelica Ferguson MRN: [id number] DOB: [birth year] Today's Date: 11/22/2020   History of Present Illness  Angelica Ferguson is an 87yoF who comes to [facility] on 7/25 after fall on Wed 7/20, found to have hip fracture. Pt is a [employer], here since November '21 living with DTR, LT plan to relocate here permanenetly. Pt went to OR on 7/28 c Dr. Juanell Fairly for Rt hemi hip arthroplasty, posterolateral approach. PMH: AF on edoxaban, CAD s/p PCI, HTN, NIDDM2, GERD. Pt has posterior hip precautions and is WBAT.  Clinical Impression  She continues to make slow but steady gains with PT.  Appears to have better strength and mentation in the AM.  She and daughter report that they were able to do HEP exercises last night and this morning with good results.  Pt showed good effort with exercises with PT as well and while she still needs physical assist with mobility and transfers, she needed less today than yesterday and certainly showed better control and deliberate R LE use during gait.  Will benefit from continued PT her and at rehab upon discharge.    Follow Up Recommendations CIR;SNF    Equipment Recommendations  Rolling walker with 5" wheels (TBD at next venue of care)    Recommendations for Other Services       Precautions / Restrictions Precautions Precautions: Posterior Hip;Fall Precaution Comments: Pt able to recall 2/3 with light cuing Restrictions Weight Bearing Restrictions: Yes RLE Weight Bearing: Weight bearing as tolerated      Mobility  Bed Mobility Overal bed mobility: Needs Assistance Bed Mobility: Supine to Sit     Supine to sit: Min assist     General bed mobility comments: Great effort today, showed ability to slowly scoot toward EOB, help to ease R LE off EOB safely, only very light assist (and heavy UE use and effort from pt) to lift torso to EOB    Transfers Overall transfer level: Needs assistance Equipment  used: Rolling walker (2 wheeled);1 person hand held assist Transfers: Sit to/from Stand Sit to Stand: Mod assist         General transfer comment: Pt showed better control in keeping weight/hips shifting forward, still needing assist to insure she did not sit back down  Ambulation/Gait Ambulation/Gait assistance: Min assist Gait Distance (Feet): 50 Feet Assistive device: Rolling walker (2 wheeled)       General Gait Details: Pt again showing great effort with ambulation.  With cuing she was better able to slow and be more deliberate with her cadence and ability to keep R knee extended during WBing.  She still had some occasional buckling, but not nearly as severe of frequent as previous efforts.  Only very rare need for PT to assist physically, heavy VCs t/o the effort, daughter following with recliner.  Stairs            Wheelchair Mobility    Modified Rankin (Stroke Patients Only)       Balance Overall balance assessment: Needs assistance Sitting-balance support: Feet supported;Bilateral upper extremity supported Sitting balance-Leahy Scale: Good       Standing balance-Leahy Scale: Fair Standing balance comment: struggled to attain standing, inconsistent buckling during dynamic standing tasks                             Pertinent Vitals/Pain Pain Assessment: Faces Faces Pain Scale: Hurts a little bit    Home Living  Prior Function                 Hand Dominance        Extremity/Trunk Assessment                Communication      Cognition Arousal/Alertness: Awake/alert Behavior During Therapy: WFL for tasks assessed/performed Overall Cognitive Status: History of cognitive impairments - at baseline                                        General Comments      Exercises Total Joint Exercises Ankle Circles/Pumps: AROM;10 reps Quad Sets: Strengthening;15 reps Short Arc Quad:  AROM;Strengthening Heel Slides: AROM;Strengthening;10 reps (resisted leg ext) Hip ABduction/ADduction: AROM;Strengthening;10 reps Straight Leg Raises: AAROM;5 reps   Assessment/Plan    PT Assessment    PT Problem List         PT Treatment Interventions      PT Goals (Current goals can be found in the Care Plan section)       Frequency BID   Barriers to discharge        Co-evaluation               AM-PAC PT "6 Clicks" Mobility  Outcome Measure Help needed turning from your back to your side while in a flat bed without using bedrails?: A Little Help needed moving from lying on your back to sitting on the side of a flat bed without using bedrails?: A Little Help needed moving to and from a bed to a chair (including a wheelchair)?: A Lot Help needed standing up from a chair using your arms (e.g., wheelchair or bedside chair)?: A Lot Help needed to walk in hospital room?: A Lot Help needed climbing 3-5 steps with a railing? : Total 6 Click Score: 13    End of Session Equipment Utilized During Treatment: Gait belt Activity Tolerance: Patient limited by fatigue;Patient tolerated treatment well Patient left: with call bell/phone within reach;with family/visitor present;with chair alarm set Nurse Communication: Mobility status PT Visit Diagnosis: Unsteadiness on feet (R26.81);Difficulty in walking, not elsewhere classified (R26.2);Other abnormalities of gait and mobility (R26.89);Muscle weakness (generalized) (M62.81)    Time: 1006-1040 PT Time Calculation (min) (ACUTE ONLY): 34 min   Charges:     PT Treatments $Gait Training: 8-22 mins $Therapeutic Exercise: 8-22 mins        Malachi Pro, DPT 11/22/2020, 2:17 PM

## 2020-11-22 NOTE — TOC Progression Note (Addendum)
Transition of Care [facility]) - Progression Note    Patient Details  Name: Angelica Ferguson MRN: [id number] Date of Birth: 1932-10-23  Transition of [facility]) CM/SW Contact  Barrie Dunker, RN Phone Number: 11/22/2020, 10:55 AM  Clinical Narrative:    Sherron Monday with [facility] to obtain the self pay rate, It is 375$ per day 2 weeks up front I gave the information to the daughter, she would like to acept the bed, I notified [facility] and she stated that they do not have any more beds to offer    I reached out to [facility], [facility], [facility] and requested the self pay rate  Awaiting response  I reached out to [facility] asking for self pay rate, awaiting a response    Expected Discharge Plan and Services                                                 Social Determinants of Health (SDOH) Interventions    Readmission Risk Interventions No flowsheet data found.

## 2020-11-22 NOTE — Progress Notes (Signed)
Subjective:  POD #3 s/p right hip hemiarthroplasty.   Patient reports right hip pain as mild.  Patient is up out of bed to a chair today.  She denies any significant right hip pain.  Patient has not yet had a bowel movement.  Patient's WBC is down to 17 today from 21.  Chest x-ray, KUB are negative for infection.  Urinalysis does not show definite infection.  Objective:   VITALS:   Vitals:   11/21/20 1830 11/21/20 2041 11/22/20 0338 11/22/20 0745  BP: (!) 126/59 (!) 116/58 128/66 133/71  Pulse: 78 87 81 69  Resp: 15 18 18 18   Temp: 98.4 F (36.9 C) 98.4 F (36.9 C) 98.8 F (37.1 C) 98.6 F (37 C)  TempSrc: Oral     SpO2: 98% 93% 93% 98%  Weight:      Height:        PHYSICAL EXAM: Right lower extremity Neurovascular intact Sensation intact distally Intact pulses distally Dorsiflexion/Plantar flexion intact Aquacel dressing: scant drainage No cellulitis present Compartment soft  LABS  Results for orders placed or performed during the hospital encounter of 11/16/20 (from the past 24 hour(s))  Glucose, capillary     Status: Abnormal   Collection Time: 11/21/20  4:30 PM  Result Value Ref Range   Glucose-Capillary 141 (H) 70 - 99 mg/dL  Glucose, capillary     Status: Abnormal   Collection Time: 11/21/20  9:28 PM  Result Value Ref Range   Glucose-Capillary 142 (H) 70 - 99 mg/dL   Comment 1 Notify RN   CBC     Status: Abnormal   Collection Time: 11/22/20  6:36 AM  Result Value Ref Range   WBC 17.1 (H) 4.0 - 10.5 K/uL   RBC 3.29 (L) 3.87 - 5.11 MIL/uL   Hemoglobin 9.1 (L) 12.0 - 15.0 g/dL   HCT 11/24/20 (L) 96.7 - 89.3 %   MCV 88.1 80.0 - 100.0 fL   MCH 27.7 26.0 - 34.0 pg   MCHC 31.4 30.0 - 36.0 g/dL   RDW 81.0 (H) 17.5 - 10.2 %   Platelets 360 150 - 400 K/uL   nRBC 0.0 0.0 - 0.2 %  Glucose, capillary     Status: Abnormal   Collection Time: 11/22/20  7:42 AM  Result Value Ref Range   Glucose-Capillary 105 (H) 70 - 99 mg/dL  Glucose, capillary     Status: Abnormal    Collection Time: 11/22/20 11:31 AM  Result Value Ref Range   Glucose-Capillary 183 (H) 70 - 99 mg/dL    DG Abd 1 View  Result Date: 11/21/2020 CLINICAL DATA:  Status post fall. Hip fracture. Patient is now constipated. EXAM: ABDOMEN - 1 VIEW COMPARISON:  None. FINDINGS: Mild stool burden noted within the colon. No dilated loops of large or small bowel. Gas is noted up to the level of the rectum. Status post right hip arthroplasty. IMPRESSION: Nonobstructive bowel gas pattern. Electronically Signed   By: 11/23/2020 M.D.   On: 11/21/2020 11:42   DG Chest Port 1 View  Result Date: 11/21/2020 CLINICAL DATA:  Fall on 07/20.  Hip fracture. EXAM: PORTABLE CHEST 1 VIEW COMPARISON:  None. FINDINGS: Midline trachea. Normal heart size. Atherosclerosis in the transverse aorta. No pleural effusion or pneumothorax. Diffuse interstitial thickening with more confluent upper lobe reticulonodular opacities with volume loss and hilar retraction superiorly. Right base scarring. No well-defined lobar consolidation. 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 No convincing  evidence of acute superimposed process. Comparison with prior radiographs would be informative if available. No posttraumatic deformity identified. Aortic Atherosclerosis (ICD10-I70.0). Electronically Signed   By: Jeronimo Greaves M.D.   On: 11/21/2020 11:43    Assessment/Plan: 3 Days Post-Op   Active Problems:   Hip fracture (HCC)   Malnutrition of moderate degree  Patient improving postop.  Continue with physical therapy.  Patient taking baseline anticoagulant for atrial fibrillation which will cover her for DVT prophylaxis.  Vital signs are stable.  No obvious infection despite elevated white count yesterday.  Continue to monitor.  Patient may need a skilled nursing facility upon discharge.  Would prefer [facility] if possible.    Juanell Fairly , MD 11/22/2020,  3:35 PM

## 2020-11-22 NOTE — Progress Notes (Signed)
Inpatient Rehab Admissions Coordinator:   I spoke with the Pts daughter and she is interested in CIR as a self-pay Pt.  She requested some time to consider her options. I will follow up with her Monday.  Megan Salon, MS, CCC-SLP Rehab Admissions Coordinator  [phone] (celll) [phone] (office)

## 2020-11-22 NOTE — PMR Pre-admission (Signed)
PMR Admission Coordinator Pre-Admission Assessment  Patient: Angelica Ferguson is an 87 y.o., female MRN: [id number] DOB: December 14, 1932 Height: 5\' 3"  (160 cm) Weight: 40.8 kg  Insurance Information HMO:    PPO:      PCP:      IPA:      80/20:      OTHER:  PRIMARY:   Uninsured   SECONDARY:      none  Financial Counselor:       Phone#:   The Data processing manager"Data Collection Information Summary" for patients in Inpatient Rehabilitation Facilities with attached "Privacy Act Statement-Health Care Records" was provided and verbally reviewed with: Patient and Family  Emergency Contact Information Contact Information     Name Relation Home Work Mobile   OssipeeHall,Susan Daughter   [phone]       Current Medical History  Patient Admitting Diagnosis: Hip fracture  History of Present Illness: Angelica PontoColleen Klasen is an  year old female with Past medical history of MH: AF on edoxaban, CAD s/p PCI, HTN, NIDDM2, and GERd who [facility] after fall on Wed 7/20, found to have hip fracture. Pt is a [employer], here since November '21 living with her daughter, with plan to relocate here permanenetly. Pt went to OR on 7/28 c Dr. Juanell FairlyKevin Krasinski for Rt hemi hip arthroplasty, posterolateral approach. P Pt has posterior hip precautions and is WBAT. Patient had elevation in white count postoperatively.  Peaked at 21.7 on postop day #2.  Subsequently decreasing.  Chest x-ray and KUB negative for infection.  Urinalysis inconsistent with infection.  White count downtrending on time of discharge.  Pain control improving.  Patient had post operative BM on 7/31.CIR was consulted to assist in return to PLOF.  [facility]  has been reviewed by the rehabilitation admission coordinator and physician.  Past Medical History  Past Medical History:  Diagnosis Date   Atrial fibrillation (HCC)    Diabetes mellitus without complication (HCC)    GERD (gastroesophageal reflux  disease)    Hyperlipidemia    Hypertension     Family History   family history includes Cancer in her sister; Heart disease in her sister.  Prior Rehab/Hospitalizations Has the patient had prior rehab or hospitalizations prior to admission? No  Has the patient had major surgery during 100 days prior to admission? Yes   Current Medications  Current Facility-Administered Medications:    acetaminophen (TYLENOL) tablet 650 mg, 650 mg, Oral, Q6H PRN, 650 mg at 11/17/20 1011 **OR** acetaminophen (TYLENOL) suppository 650 mg, 650 mg, Rectal, Q6H PRN, Juanell FairlyKrasinski, Kevin, MD   albuterol (VENTOLIN HFA) 108 (90 Base) MCG/ACT inhaler 1-2 puff, 1-2 puff, Inhalation, Q6H PRN, Juanell FairlyKrasinski, Kevin, MD   alum & mag hydroxide-simeth (MAALOX/MYLANTA) 200-200-20 MG/5ML suspension 30 mL, 30 mL, Oral, Q4H PRN, Juanell FairlyKrasinski, Kevin, MD   atorvastatin (LIPITOR) tablet 80 mg, 80 mg, Oral, QPM, Juanell FairlyKrasinski, Kevin, MD, 80 mg at 11/21/20 1639   beclomethasone (QVAR) 80 MCG/ACT inhaler 1 puff, 1 puff, Inhalation, BID, Juanell FairlyKrasinski, Kevin, MD, 1 puff at 11/22/20 0802   bisacodyl (DULCOLAX) suppository 10 mg, 10 mg, Rectal, Daily PRN, Juanell FairlyKrasinski, Kevin, MD   bisoprolol (ZEBETA) tablet 5 mg, 5 mg, Oral, BID, Juanell FairlyKrasinski, Kevin, MD, 5 mg at 11/22/20 0802   Chlorhexidine Gluconate Cloth 2 % PADS 6 each, 6 each, Topical, Daily, Lolita PatellaSreenath, Sudheer B, MD, 6 each at 11/22/20 0803   diltiazem (CARDIZEM) tablet 60 mg, 60 mg, Oral, BID, Juanell FairlyKrasinski, Kevin, MD, 60 mg at  11/22/20 0802   edoxaban (SAVAYSA) tablet 30 mg, 30 mg, Oral, Daily, Juanell Fairly, MD, 30 mg at 11/22/20 0802   feeding supplement (ENSURE ENLIVE / ENSURE PLUS) liquid 237 mL, 237 mL, Oral, BID BM, Juanell Fairly, MD, 237 mL at 11/22/20 0803   Glycopyrrolate-Formoterol 9-4.8 MCG/ACT AERO 2 puff, 2 puff, Inhalation, BID, Juanell Fairly, MD, 2 puff at 11/22/20 0802   HYDROcodone-acetaminophen (NORCO/VICODIN) 5-325 MG per tablet 1-2 tablet, 1-2 tablet, Oral, Q4H PRN, Juanell Fairly, MD, 2 tablet at 11/22/20 0802   insulin aspart (novoLOG) injection 0-5 Units, 0-5 Units, Subcutaneous, QHS, Juanell Fairly, MD   insulin aspart (novoLOG) injection 0-9 Units, 0-9 Units, Subcutaneous, TID WC, Juanell Fairly, MD, 2 Units at 11/22/20 1142   methocarbamol (ROBAXIN) tablet 500 mg, 500 mg, Oral, Q8H PRN, Georgeann Oppenheim, Sudheer B, MD, 500 mg at 11/22/20 1010   morphine 2 MG/ML injection 0.5-1 mg, 0.5-1 mg, Intravenous, Q2H PRN, Juanell Fairly, MD, 1 mg at 11/20/20 0716   ondansetron (ZOFRAN) tablet 4 mg, 4 mg, Oral, Q6H PRN **OR** ondansetron (ZOFRAN) injection 4 mg, 4 mg, Intravenous, Q6H PRN, Juanell Fairly, MD, 4 mg at 11/20/20 0716   pantoprazole (PROTONIX) EC tablet 20 mg, 20 mg, Oral, Daily, Juanell Fairly, MD, 20 mg at 11/22/20 0802   polyethylene glycol (MIRALAX / GLYCOLAX) packet 17 g, 17 g, Oral, Daily, Sreenath, Sudheer B, MD, 17 g at 11/22/20 1010   senna-docusate (Senokot-S) tablet 1 tablet, 1 tablet, Oral, BID, Georgeann Oppenheim, Sudheer B, MD, 1 tablet at 11/22/20 0802   sodium phosphate (FLEET) 7-19 GM/118ML enema 1 enema, 1 enema, Rectal, Once PRN, Juanell Fairly, MD   traMADol Janean Sark) tablet 50 mg, 50 mg, Oral, Q6H PRN, Juanell Fairly, MD, 50 mg at 11/22/20 1142  Patients Current Diet:  Diet Order             Diet Carb Modified Fluid consistency: Thin; Room service appropriate? Yes  Diet effective now                   Precautions / Restrictions Precautions Precautions: Posterior Hip, Fall Precaution Booklet Issued: Yes (comment) Restrictions Weight Bearing Restrictions: Yes RLE Weight Bearing: Weight bearing as tolerated   Has the patient had 2 or more falls or a fall with injury in the past year? Yes  Prior Activity Level Community (5-7x/wk): Active in the community PTA  Prior Functional Level Self Care: Did the patient need help bathing, dressing, using the toilet or eating? Independent  Indoor Mobility: Did the patient need assistance  with walking from room to room (with or without device)? Independent  Stairs: Did the patient need assistance with internal or external stairs (with or without device)? Needed some help  Functional Cognition: Did the patient need help planning regular tasks such as shopping or remembering to take medications? Needed some help  Home Assistive Devices / Equipment Home Assistive Devices/Equipment: Hearing aid Home Equipment: Cane - single point, Toilet riser, Other (comment), Bedside commode, Shower seat  Prior Device Use: Indicate devices/aids used by the patient prior to current illness, exacerbation or injury? None of the above  Current Functional Level Cognition  Overall Cognitive Status: History of cognitive impairments - at baseline Orientation Level: Oriented X4 General Comments: per DTR very minimal signs of cognitive decline.    Extremity Assessment (includes Sensation/Coordination)  Upper Extremity Assessment: Generalized weakness  Lower Extremity Assessment: Generalized weakness, RLE deficits/detail RLE Deficits / Details: s/p hemiarthroplasty, pain limited RLE:  (unable to SAQ, mod-maxA required for  ABDCT slides in bed)    ADLs  Overall ADL's : Needs assistance/impaired General ADL Comments: Pt currently requires MAX A for bed level toileting, MAX A for seated LB dressing involving lateral leans, and set up and PRN Min A for seated UB ADL. Set up and SBA for seated grooming tasks. Primarily pain limited with movement of RLE.    Mobility  Overal bed mobility: Needs Assistance Bed Mobility: Sit to Supine Supine to sit: Min assist Sit to supine: Mod assist General bed mobility comments: increased assist for back to bed to manage op leg    Transfers  Overall transfer level: Needs assistance Equipment used: Rolling walker (2 wheeled), 1 person hand held assist Transfers: Sit to/from Stand Sit to Stand: Mod assist  Lateral/Scoot Transfers: Mod assist, From elevated  surface General transfer comment: On multiple attempts this afternoon pt was able to initiate upward movmeent but ultimately lost balance backward and needed increasing assist to keep weight forward/upward moementum    Ambulation / Gait / Stairs / Wheelchair Mobility  Ambulation/Gait Ambulation/Gait assistance: Mod assist, Max assist Gait Distance (Feet): 40 Feet Assistive device: Rolling walker (2 wheeled) General Gait Details: Pt again with great effort during ambulation, however she was unable to take weight through R LE this afternoon and consistently had buckling.  Consistent cuing (with little change in cadence) for extended R knee/WBing, L LE advancement, walker/UE use and overall despite the great effort she needed consistent assist to maintain standing.  Forward flexed posture, minimally corrected with cuing.    Posture / Balance Dynamic Sitting Balance Sitting balance - Comments: initially poor requiring CGA-Min A, improving with time to SBA and UE-BUE support Balance Overall balance assessment: Needs assistance Sitting-balance support: Feet supported, Bilateral upper extremity supported Sitting balance-Leahy Scale: Good Sitting balance - Comments: initially poor requiring CGA-Min A, improving with time to SBA and UE-BUE support Standing balance support: Bilateral upper extremity supported Standing balance-Leahy Scale: Fair Standing balance comment: struggled to attain standing, inconsistent and with buckling during dynamic standing tasks    Special needs/care consideration Skin DM2, receiving Novolog   Previous Home Environment (from acute therapy documentation) Living Arrangements: Children (pt now lives with daughter and daughter's partner)  Lives With: Spouse Available Help at Discharge: Family, Available 24 hours/day Type of Home: House Home Layout: Two level, Able to live on main level with bedroom/bathroom Alternate Level Stairs-Number of Steps: has chair lift; 2nd floor  set up as patient's living area with all needs accessible; dtr reports pt is able to stay on main floor of the home as needed with all needs accessible Home Access: Stairs to enter Entrance Stairs-Rails: Left, Right Entrance Stairs-Number of Steps: 3 Bathroom Toilet: Handicapped height (+ riser w/ rails) Home Care Services: No Additional Comments: shower chair electronically lowers pt into the tub and raises her up so pt can take baths with daughter's assist  Discharge Living Setting Plans for Discharge Living Setting: Patient's home Type of Home at Discharge: House Discharge Home Layout: Able to live on main level with bedroom/bathroom Discharge Home Access: Stairs to enter Entrance Stairs-Rails: Right, Left Entrance Stairs-Number of Steps: 3 Discharge Bathroom Shower/Tub: Tub/shower unit Discharge Bathroom Toilet: Handicapped height Discharge Bathroom Accessibility: Yes How Accessible: Accessible via walker Does the patient have any problems obtaining your medications?: No  Social/Family/Support Systems Patient Roles: Other (Comment) Contact Information: [phone] Anticipated Caregiver: Lenise Arena Anticipated Caregiver's Contact Information: [phone] Ability/Limitations of Caregiver: can provide min A Caregiver Availability: 24/7 Discharge Plan Discussed with  Primary Caregiver: Yes Is Caregiver In Agreement with Plan?: No Does Caregiver/Family have Issues with Lodging/Transportation while Pt is in Rehab?: Yes  Goals Patient/Family Goal for Rehab: PT/OT Supervision Expected length of stay: 7-10 days Pt/Family Agrees to Admission and willing to participate: Yes Program Orientation Provided & Reviewed with Pt/Caregiver Including Roles  & Responsibilities: Yes  Decrease burden of Care through IP rehab admission: Specialzed equipment needs, Decrease number of caregivers, Bowel and bladder program, and Patient/family education  Possible need for SNF placement upon  discharge: not anticipated  Patient Condition: I have reviewed medical records from [facility], spoken with CM, and patient and family member. I discussed via phone for inpatient rehabilitation assessment.  Patient will benefit from ongoing PT and OT, can actively participate in 3 hours of therapy a day 5 days of the week, and can make measurable gains during the admission.  Patient will also benefit from the coordinated team approach during an Inpatient Acute Rehabilitation admission.  The patient will receive intensive therapy as well as Rehabilitation physician, nursing, social worker, and care management interventions.  Due to safety, skin/wound care, disease management, medication administration, pain management, and patient education the patient requires 24 hour a day rehabilitation nursing.  The patient is currently min-mod A with mobility and basic ADLs.  Discharge setting and therapy post discharge at home with home health is anticipated.  Patient has agreed to participate in the Acute Inpatient Rehabilitation Program and will admit today.  Preadmission Screen Completed By:  Jeronimo Greaves, 11/22/2020 12:55 PM ______________________________________________________________________   Discussed status with Dr. Riley Kill on 11/23/20 at 930 and received approval for admission today.  Admission Coordinator:  Moises Blood, time 1610 /Date 11/23/20   Assessment/Plan: Diagnosis:right hip fx s/p hip hemi Does the need for close, 24 hr/day Medical supervision in concert with the patient's rehab needs make it unreasonable for this patient to be served in a less intensive setting? Yes Co-Morbidities requiring supervision/potential complications: CAD, HFN, DM, AF Due to bladder management, bowel management, safety, skin/wound care, disease management, medication administration, pain management, and patient education, does the patient require 24 hr/day rehab nursing? Yes Does the  patient require coordinated care of a physician, rehab nurse, PT, OT,  address physical and functional deficits in the context of the above medical diagnosis(es)? Yes Addressing deficits in the following areas: balance, endurance, locomotion, strength, transferring, bowel/bladder control, bathing, dressing, feeding, grooming, toileting, and psychosocial support Can the patient actively participate in an intensive therapy program of at least 3 hrs of therapy 5 days a week? Yes The potential for patient to make measurable gains while on inpatient rehab is excellent Anticipated functional outcomes upon discharge from inpatient rehab: supervision PT, supervision OT, n/a SLP Estimated rehab length of stay to reach the above functional goals is: 7-10 days Anticipated discharge destination: Home 10. Overall Rehab/Functional Prognosis: excellent   MD Signature: Ranelle Oyster, MD, [facility] Medicine [facility] Rehabilitation 11/23/2020

## 2020-11-23 ENCOUNTER — Encounter (HOSPITAL_COMMUNITY): Payer: Self-pay | Admitting: Physical Medicine and Rehabilitation

## 2020-11-23 ENCOUNTER — Inpatient Hospital Stay (HOSPITAL_COMMUNITY)
Admission: RE | Admit: 2020-11-23 | Discharge: 2020-12-03 | DRG: 560 | Disposition: A | Discharge status: Home Health | Payer: Medicaid Other | Source: Other Acute Inpatient Hospital | Attending: Physical Medicine and Rehabilitation | Admitting: Physical Medicine and Rehabilitation

## 2020-11-23 ENCOUNTER — Other Ambulatory Visit: Payer: Self-pay

## 2020-11-23 DIAGNOSIS — W1830XD Fall on same level, unspecified, subsequent encounter: Secondary | ICD-10-CM

## 2020-11-23 DIAGNOSIS — I110 Hypertensive heart disease with heart failure: Secondary | ICD-10-CM | POA: Diagnosis present

## 2020-11-23 DIAGNOSIS — J449 Chronic obstructive pulmonary disease, unspecified: Secondary | ICD-10-CM | POA: Diagnosis present

## 2020-11-23 DIAGNOSIS — Z87891 Personal history of nicotine dependence: Secondary | ICD-10-CM

## 2020-11-23 DIAGNOSIS — Z7984 Long term (current) use of oral hypoglycemic drugs: Secondary | ICD-10-CM

## 2020-11-23 DIAGNOSIS — N179 Acute kidney failure, unspecified: Secondary | ICD-10-CM

## 2020-11-23 DIAGNOSIS — L8990 Pressure ulcer of unspecified site, unspecified stage: Secondary | ICD-10-CM | POA: Insufficient documentation

## 2020-11-23 DIAGNOSIS — E785 Hyperlipidemia, unspecified: Secondary | ICD-10-CM | POA: Diagnosis present

## 2020-11-23 DIAGNOSIS — D62 Acute posthemorrhagic anemia: Secondary | ICD-10-CM

## 2020-11-23 DIAGNOSIS — K219 Gastro-esophageal reflux disease without esophagitis: Secondary | ICD-10-CM | POA: Diagnosis present

## 2020-11-23 DIAGNOSIS — I509 Heart failure, unspecified: Secondary | ICD-10-CM | POA: Diagnosis present

## 2020-11-23 DIAGNOSIS — S72001S Fracture of unspecified part of neck of right femur, sequela: Secondary | ICD-10-CM

## 2020-11-23 DIAGNOSIS — I10 Essential (primary) hypertension: Secondary | ICD-10-CM | POA: Diagnosis present

## 2020-11-23 DIAGNOSIS — S72001D Fracture of unspecified part of neck of right femur, subsequent encounter for closed fracture with routine healing: Principal | ICD-10-CM

## 2020-11-23 DIAGNOSIS — Z96641 Presence of right artificial hip joint: Secondary | ICD-10-CM | POA: Diagnosis present

## 2020-11-23 DIAGNOSIS — E119 Type 2 diabetes mellitus without complications: Secondary | ICD-10-CM | POA: Diagnosis present

## 2020-11-23 DIAGNOSIS — D72829 Elevated white blood cell count, unspecified: Secondary | ICD-10-CM | POA: Diagnosis present

## 2020-11-23 DIAGNOSIS — I4891 Unspecified atrial fibrillation: Secondary | ICD-10-CM | POA: Diagnosis present

## 2020-11-23 DIAGNOSIS — L89152 Pressure ulcer of sacral region, stage 2: Secondary | ICD-10-CM | POA: Diagnosis present

## 2020-11-23 DIAGNOSIS — Z79899 Other long term (current) drug therapy: Secondary | ICD-10-CM

## 2020-11-23 HISTORY — DX: Fracture of unspecified part of neck of right femur, sequela: S72.001S

## 2020-11-23 LAB — CBC WITH DIFFERENTIAL/PLATELET
Abs Immature Granulocytes: 0.17 10*3/uL — ABNORMAL HIGH (ref 0.00–0.07)
Basophils Absolute: 0.1 10*3/uL (ref 0.0–0.1)
Basophils Relative: 1 %
Eosinophils Absolute: 0.5 10*3/uL (ref 0.0–0.5)
Eosinophils Relative: 3 %
HCT: 31.5 % — ABNORMAL LOW (ref 36.0–46.0)
Hemoglobin: 9.8 g/dL — ABNORMAL LOW (ref 12.0–15.0)
Immature Granulocytes: 1 %
Lymphocytes Relative: 10 %
Lymphs Abs: 1.6 10*3/uL (ref 0.7–4.0)
MCH: 27.5 pg (ref 26.0–34.0)
MCHC: 31.1 g/dL (ref 30.0–36.0)
MCV: 88.5 fL (ref 80.0–100.0)
Monocytes Absolute: 1.7 10*3/uL — ABNORMAL HIGH (ref 0.1–1.0)
Monocytes Relative: 11 %
Neutro Abs: 11.7 10*3/uL — ABNORMAL HIGH (ref 1.7–7.7)
Neutrophils Relative %: 74 %
Platelets: 502 10*3/uL — ABNORMAL HIGH (ref 150–400)
RBC: 3.56 MIL/uL — ABNORMAL LOW (ref 3.87–5.11)
RDW: 16.3 % — ABNORMAL HIGH (ref 11.5–15.5)
WBC: 15.7 10*3/uL — ABNORMAL HIGH (ref 4.0–10.5)
nRBC: 0 % (ref 0.0–0.2)

## 2020-11-23 LAB — BASIC METABOLIC PANEL
Anion gap: 8 (ref 5–15)
BUN: 30 mg/dL — ABNORMAL HIGH (ref 8–23)
CO2: 30 mmol/L (ref 22–32)
Calcium: 8.7 mg/dL — ABNORMAL LOW (ref 8.9–10.3)
Chloride: 97 mmol/L — ABNORMAL LOW (ref 98–111)
Creatinine, Ser: 0.78 mg/dL (ref 0.44–1.00)
GFR, Estimated: >60 mL/min (ref >60)
Glucose, Bld: 124 mg/dL — ABNORMAL HIGH (ref 70–99)
Potassium: 3.5 mmol/L (ref 3.5–5.1)
Sodium: 135 mmol/L (ref 135–145)

## 2020-11-23 LAB — GLUCOSE, CAPILLARY
Glucose-Capillary: 128 mg/dL — ABNORMAL HIGH (ref 70–99)
Glucose-Capillary: 128 mg/dL — ABNORMAL HIGH (ref 70–99)
Glucose-Capillary: 157 mg/dL — ABNORMAL HIGH (ref 70–99)
Glucose-Capillary: 159 mg/dL — ABNORMAL HIGH (ref 70–99)

## 2020-11-23 LAB — SURGICAL PATHOLOGY

## 2020-11-23 MED ORDER — PROCHLORPERAZINE 25 MG RE SUPP: 12.5000 mg | Freq: Four times a day (QID) | RECTAL | Status: DC | PRN

## 2020-11-23 MED ORDER — GUAIFENESIN-DM 100-10 MG/5ML PO SYRP: 5.0000 mL | ORAL_SOLUTION | Freq: Four times a day (QID) | ORAL | Status: DC | PRN

## 2020-11-23 MED ORDER — BECLOMETHASONE DIPROP HFA 80 MCG/ACT IN AERB
2.0000 | INHALATION_SPRAY | Freq: Two times a day (BID) | RESPIRATORY_TRACT | Status: DC
  Administered 2020-11-23 – 2020-12-03 (×20): 2 via RESPIRATORY_TRACT
  Filled 2020-11-23: qty 10.6

## 2020-11-23 MED ORDER — GLYCOPYRROLATE-FORMOTEROL 9-4.8 MCG/ACT IN AERO
2.0000 | INHALATION_SPRAY | Freq: Two times a day (BID) | RESPIRATORY_TRACT | Status: DC
  Administered 2020-11-23 – 2020-12-03 (×17): 2 via RESPIRATORY_TRACT
  Filled 2020-11-23 (×2): qty 10.7

## 2020-11-23 MED ORDER — NON FORMULARY: 1.0000 | Freq: Two times a day (BID) | Status: DC

## 2020-11-23 MED ORDER — ALBUTEROL SULFATE (2.5 MG/3ML) 0.083% IN NEBU
3.0000 mL | INHALATION_SOLUTION | Freq: Four times a day (QID) | RESPIRATORY_TRACT | Status: DC | PRN
  Administered 2020-11-25: 3 mL via RESPIRATORY_TRACT

## 2020-11-23 MED ORDER — BUDESONIDE 0.5 MG/2ML IN SUSP: 0.2500 mg | Freq: Two times a day (BID) | RESPIRATORY_TRACT | Status: DC

## 2020-11-23 MED ORDER — ENSURE ENLIVE PO LIQD
237.0000 mL | Freq: Two times a day (BID) | ORAL | Status: DC
  Administered 2020-11-24 – 2020-12-03 (×14): 237 mL via ORAL

## 2020-11-23 MED ORDER — HYDROCODONE-ACETAMINOPHEN 5-325 MG PO TABS
1.0000 | ORAL_TABLET | ORAL | Status: DC | PRN
  Administered 2020-12-03: 1 via ORAL
  Filled 2020-11-23: qty 1

## 2020-11-23 MED ORDER — ATORVASTATIN CALCIUM 80 MG PO TABS
80.0000 mg | ORAL_TABLET | Freq: Every evening | ORAL | Status: DC
  Administered 2020-11-23 – 2020-12-02 (×10): 80 mg via ORAL
  Filled 2020-11-23 (×10): qty 1

## 2020-11-23 MED ORDER — HYDROCODONE-ACETAMINOPHEN 5-325 MG PO TABS: 1.0000 | ORAL_TABLET | ORAL | 0 refills | Status: DC | PRN

## 2020-11-23 MED ORDER — DIPHENHYDRAMINE HCL 12.5 MG/5ML PO ELIX: 12.5000 mg | ORAL_SOLUTION | Freq: Four times a day (QID) | ORAL | Status: DC | PRN

## 2020-11-23 MED ORDER — PROCHLORPERAZINE MALEATE 5 MG PO TABS: 5.0000 mg | ORAL_TABLET | Freq: Four times a day (QID) | ORAL | Status: DC | PRN

## 2020-11-23 MED ORDER — ARFORMOTEROL TARTRATE 15 MCG/2ML IN NEBU: 15.0000 ug | INHALATION_SOLUTION | Freq: Two times a day (BID) | RESPIRATORY_TRACT | Status: DC

## 2020-11-23 MED ORDER — INSULIN ASPART 100 UNIT/ML IJ SOLN
0.0000 [IU] | Freq: Three times a day (TID) | INTRAMUSCULAR | Status: DC
  Administered 2020-11-23 – 2020-11-24 (×2): 1 [IU] via SUBCUTANEOUS
  Administered 2020-11-24: 2 [IU] via SUBCUTANEOUS
  Administered 2020-11-24: 1 [IU] via SUBCUTANEOUS
  Administered 2020-11-25 (×2): 2 [IU] via SUBCUTANEOUS
  Administered 2020-11-25 – 2020-12-02 (×7): 1 [IU] via SUBCUTANEOUS

## 2020-11-23 MED ORDER — METHOCARBAMOL 500 MG PO TABS: 500.0000 mg | ORAL_TABLET | Freq: Three times a day (TID) | ORAL | Status: DC | PRN

## 2020-11-23 MED ORDER — POLYETHYLENE GLYCOL 3350 17 G PO PACK
17.0000 g | PACK | Freq: Every day | ORAL | Status: DC | PRN
  Administered 2020-11-26: 17 g via ORAL
  Filled 2020-11-23: qty 1

## 2020-11-23 MED ORDER — SENNOSIDES-DOCUSATE SODIUM 8.6-50 MG PO TABS: 1.0000 | ORAL_TABLET | Freq: Two times a day (BID) | ORAL | Status: DC

## 2020-11-23 MED ORDER — FLEET ENEMA 7-19 GM/118ML RE ENEM: 1.0000 | ENEMA | Freq: Once | RECTAL | Status: DC | PRN

## 2020-11-23 MED ORDER — BISACODYL 10 MG RE SUPP: 10.0000 mg | Freq: Every day | RECTAL | Status: DC | PRN

## 2020-11-23 MED ORDER — NON FORMULARY: 9.0000 ug | Freq: Two times a day (BID) | Status: DC

## 2020-11-23 MED ORDER — ALUM & MAG HYDROXIDE-SIMETH 200-200-20 MG/5ML PO SUSP: 30.0000 mL | ORAL | Status: DC | PRN

## 2020-11-23 MED ORDER — PROCHLORPERAZINE EDISYLATE 10 MG/2ML IJ SOLN: 5.0000 mg | Freq: Four times a day (QID) | INTRAMUSCULAR | Status: DC | PRN

## 2020-11-23 MED ORDER — CHLORHEXIDINE GLUCONATE CLOTH 2 % EX PADS: 6.0000 | MEDICATED_PAD | Freq: Every day | CUTANEOUS | Status: DC

## 2020-11-23 MED ORDER — ACETAMINOPHEN 325 MG PO TABS
325.0000 mg | ORAL_TABLET | ORAL | Status: DC | PRN
Start: 2020-11-23 — End: 2020-12-03
  Administered 2020-11-24 – 2020-12-01 (×9): 650 mg via ORAL
  Filled 2020-11-23 (×10): qty 2

## 2020-11-23 MED ORDER — EDOXABAN TOSYLATE 30 MG PO TABS
30.0000 mg | ORAL_TABLET | Freq: Every day | ORAL | Status: DC
  Administered 2020-11-24 – 2020-12-03 (×10): 30 mg via ORAL
  Filled 2020-11-23 (×10): qty 1

## 2020-11-23 MED ORDER — POLYETHYLENE GLYCOL 3350 17 G PO PACK
17.0000 g | PACK | Freq: Every day | ORAL | Status: DC
Start: 2020-11-24 — End: 2020-12-03
  Administered 2020-11-28 – 2020-12-01 (×2): 17 g via ORAL
  Filled 2020-11-23 (×7): qty 1

## 2020-11-23 MED ORDER — TRAMADOL HCL 50 MG PO TABS
50.0000 mg | ORAL_TABLET | Freq: Four times a day (QID) | ORAL | Status: DC | PRN
  Administered 2020-11-28 – 2020-12-02 (×5): 50 mg via ORAL
  Filled 2020-11-23 (×5): qty 1

## 2020-11-23 MED ORDER — PANTOPRAZOLE SODIUM 20 MG PO TBEC
20.0000 mg | DELAYED_RELEASE_TABLET | Freq: Every day | ORAL | Status: DC
  Administered 2020-11-24 – 2020-12-03 (×10): 20 mg via ORAL
  Filled 2020-11-23 (×10): qty 1

## 2020-11-23 MED ORDER — BISOPROLOL FUMARATE 5 MG PO TABS
5.0000 mg | ORAL_TABLET | Freq: Two times a day (BID) | ORAL | Status: DC
  Administered 2020-11-23 – 2020-12-03 (×20): 5 mg via ORAL
  Filled 2020-11-23 (×21): qty 1

## 2020-11-23 MED ORDER — DILTIAZEM HCL 60 MG PO TABS
60.0000 mg | ORAL_TABLET | Freq: Two times a day (BID) | ORAL | Status: DC
  Administered 2020-11-23 – 2020-12-03 (×20): 60 mg via ORAL
  Filled 2020-11-23 (×21): qty 1

## 2020-11-23 MED ORDER — INSULIN ASPART 100 UNIT/ML IJ SOLN: 0.0000 [IU] | Freq: Every day | INTRAMUSCULAR | Status: DC

## 2020-11-23 MED ORDER — TRAZODONE HCL 50 MG PO TABS
25.0000 mg | ORAL_TABLET | Freq: Every evening | ORAL | Status: DC | PRN
  Administered 2020-11-23 – 2020-11-24 (×2): 25 mg via ORAL
  Administered 2020-11-27 – 2020-12-01 (×5): 50 mg via ORAL
  Filled 2020-11-23 (×8): qty 1

## 2020-11-23 MED ORDER — UMECLIDINIUM BROMIDE 62.5 MCG/INH IN AEPB
1.0000 | INHALATION_SPRAY | Freq: Every day | RESPIRATORY_TRACT | Status: DC
  Filled 2020-11-23: qty 7

## 2020-11-23 MED ORDER — SENNOSIDES-DOCUSATE SODIUM 8.6-50 MG PO TABS
2.0000 | ORAL_TABLET | Freq: Every day | ORAL | Status: DC
  Administered 2020-11-24 – 2020-12-03 (×7): 2 via ORAL
  Filled 2020-11-23 (×8): qty 2

## 2020-11-23 NOTE — Progress Notes (Signed)
PMR Admission Coordinator Pre-Admission Assessment  Patient: Angelica Ferguson is an 87 y.o., female MRN: [id number] DOB: 07/10/1932 Height: 5' 3" (160 cm) Weight: 40.8 kg  Insurance Information HMO:    PPO:      PCP:      IPA:      80/20:      OTHER:  PRIMARY:   Uninsured   SECONDARY:      none  Financial Counselor:       Phone#:   The "Data Collection Information Summary" for patients in Inpatient Rehabilitation Facilities with attached "Privacy Act Statement-Health Care Records" was provided and verbally reviewed with: Patient and Family  Emergency Contact Information Contact Information     Name Relation Home Work Mobile   Hall,Susan Daughter   [phone]       Current Medical History  Patient Admitting Diagnosis: Hip fracture  History of Present Illness: Angelica Ferguson is an  year old female with Past medical history of MH: AF on edoxaban, CAD s/p PCI, HTN, NIDDM2, and GERd who comes to [facility] on 7/25 after fall on Wed 7/20, found to have hip fracture. Pt is a [place], here since November '21 living with her daughter, with plan to relocate here permanenetly. Pt went to OR on 7/28 c Dr. Kevin Krasinski for Rt hemi hip arthroplasty, posterolateral approach. P Pt has posterior hip precautions and is WBAT. Patient had elevation in white count postoperatively.  Peaked at 21.7 on postop day #2.  Subsequently decreasing.  Chest x-ray and KUB negative for infection.  Urinalysis inconsistent with infection.  White count downtrending on time of discharge.  Pain control improving.  Patient had post operative BM on 7/31.CIR was consulted to assist in return to PLOF.  Patient's medical record from [facility]  has been reviewed by the rehabilitation admission coordinator and physician.  Past Medical History  Past Medical History:  Diagnosis Date   Atrial fibrillation (HCC)    Diabetes mellitus without complication (HCC)    GERD (gastroesophageal reflux  disease)    Hyperlipidemia    Hypertension     Family History   family history includes Cancer in her sister; Heart disease in her sister.  Prior Rehab/Hospitalizations Has the patient had prior rehab or hospitalizations prior to admission? No  Has the patient had major surgery during 100 days prior to admission? Yes   Current Medications  Current Facility-Administered Medications:    acetaminophen (TYLENOL) tablet 650 mg, 650 mg, Oral, Q6H PRN, 650 mg at 11/17/20 1011 **OR** acetaminophen (TYLENOL) suppository 650 mg, 650 mg, Rectal, Q6H PRN, Krasinski, Kevin, MD   albuterol (VENTOLIN HFA) 108 (90 Base) MCG/ACT inhaler 1-2 puff, 1-2 puff, Inhalation, Q6H PRN, Krasinski, Kevin, MD   alum & mag hydroxide-simeth (MAALOX/MYLANTA) 200-200-20 MG/5ML suspension 30 mL, 30 mL, Oral, Q4H PRN, Krasinski, Kevin, MD   atorvastatin (LIPITOR) tablet 80 mg, 80 mg, Oral, QPM, Krasinski, Kevin, MD, 80 mg at 11/21/20 1639   beclomethasone (QVAR) 80 MCG/ACT inhaler 1 puff, 1 puff, Inhalation, BID, Krasinski, Kevin, MD, 1 puff at 11/22/20 0802   bisacodyl (DULCOLAX) suppository 10 mg, 10 mg, Rectal, Daily PRN, Krasinski, Kevin, MD   bisoprolol (ZEBETA) tablet 5 mg, 5 mg, Oral, BID, Krasinski, Kevin, MD, 5 mg at 11/22/20 0802   Chlorhexidine Gluconate Cloth 2 % PADS 6 each, 6 each, Topical, Daily, Sreenath, Sudheer B, MD, 6 each at 11/22/20 0803   diltiazem (CARDIZEM) tablet 60 mg, 60 mg, Oral, BID, Krasinski, Kevin, MD, 60 mg at   11/22/20 0802   edoxaban (SAVAYSA) tablet 30 mg, 30 mg, Oral, Daily, Krasinski, Kevin, MD, 30 mg at 11/22/20 0802   feeding supplement (ENSURE ENLIVE / ENSURE PLUS) liquid 237 mL, 237 mL, Oral, BID BM, Krasinski, Kevin, MD, 237 mL at 11/22/20 0803   Glycopyrrolate-Formoterol 9-4.8 MCG/ACT AERO 2 puff, 2 puff, Inhalation, BID, Krasinski, Kevin, MD, 2 puff at 11/22/20 0802   HYDROcodone-acetaminophen (NORCO/VICODIN) 5-325 MG per tablet 1-2 tablet, 1-2 tablet, Oral, Q4H PRN, Krasinski,  Kevin, MD, 2 tablet at 11/22/20 0802   insulin aspart (novoLOG) injection 0-5 Units, 0-5 Units, Subcutaneous, QHS, Krasinski, Kevin, MD   insulin aspart (novoLOG) injection 0-9 Units, 0-9 Units, Subcutaneous, TID WC, Krasinski, Kevin, MD, 2 Units at 11/22/20 1142   methocarbamol (ROBAXIN) tablet 500 mg, 500 mg, Oral, Q8H PRN, Sreenath, Sudheer B, MD, 500 mg at 11/22/20 1010   morphine 2 MG/ML injection 0.5-1 mg, 0.5-1 mg, Intravenous, Q2H PRN, Krasinski, Kevin, MD, 1 mg at 11/20/20 0716   ondansetron (ZOFRAN) tablet 4 mg, 4 mg, Oral, Q6H PRN **OR** ondansetron (ZOFRAN) injection 4 mg, 4 mg, Intravenous, Q6H PRN, Krasinski, Kevin, MD, 4 mg at 11/20/20 0716   pantoprazole (PROTONIX) EC tablet 20 mg, 20 mg, Oral, Daily, Krasinski, Kevin, MD, 20 mg at 11/22/20 0802   polyethylene glycol (MIRALAX / GLYCOLAX) packet 17 g, 17 g, Oral, Daily, Sreenath, Sudheer B, MD, 17 g at 11/22/20 1010   senna-docusate (Senokot-S) tablet 1 tablet, 1 tablet, Oral, BID, Sreenath, Sudheer B, MD, 1 tablet at 11/22/20 0802   sodium phosphate (FLEET) 7-19 GM/118ML enema 1 enema, 1 enema, Rectal, Once PRN, Krasinski, Kevin, MD   traMADol (ULTRAM) tablet 50 mg, 50 mg, Oral, Q6H PRN, Krasinski, Kevin, MD, 50 mg at 11/22/20 1142  Patients Current Diet:  Diet Order             Diet Carb Modified Fluid consistency: Thin; Room service appropriate? Yes  Diet effective now                   Precautions / Restrictions Precautions Precautions: Posterior Hip, Fall Precaution Booklet Issued: Yes (comment) Restrictions Weight Bearing Restrictions: Yes RLE Weight Bearing: Weight bearing as tolerated   Has the patient had 2 or more falls or a fall with injury in the past year? Yes  Prior Activity Level Community (5-7x/wk): Active in the community PTA  Prior Functional Level Self Care: Did the patient need help bathing, dressing, using the toilet or eating? Independent  Indoor Mobility: Did the patient need assistance  with walking from room to room (with or without device)? Independent  Stairs: Did the patient need assistance with internal or external stairs (with or without device)? Needed some help  Functional Cognition: Did the patient need help planning regular tasks such as shopping or remembering to take medications? Needed some help  Home Assistive Devices / Equipment Home Assistive Devices/Equipment: Hearing aid Home Equipment: Cane - single point, Toilet riser, Other (comment), Bedside commode, Shower seat  Prior Device Use: Indicate devices/aids used by the patient prior to current illness, exacerbation or injury? None of the above  Current Functional Level Cognition  Overall Cognitive Status: History of cognitive impairments - at baseline Orientation Level: Oriented X4 General Comments: per DTR very minimal signs of cognitive decline.    Extremity Assessment (includes Sensation/Coordination)  Upper Extremity Assessment: Generalized weakness  Lower Extremity Assessment: Generalized weakness, RLE deficits/detail RLE Deficits / Details: s/p hemiarthroplasty, pain limited RLE:  (unable to SAQ, mod-maxA required for   ABDCT slides in bed)    ADLs  Overall ADL's : Needs assistance/impaired General ADL Comments: Pt currently requires MAX A for bed level toileting, MAX A for seated LB dressing involving lateral leans, and set up and PRN Min A for seated UB ADL. Set up and SBA for seated grooming tasks. Primarily pain limited with movement of RLE.    Mobility  Overal bed mobility: Needs Assistance Bed Mobility: Sit to Supine Supine to sit: Min assist Sit to supine: Mod assist General bed mobility comments: increased assist for back to bed to manage op leg    Transfers  Overall transfer level: Needs assistance Equipment used: Rolling walker (2 wheeled), 1 person hand held assist Transfers: Sit to/from Stand Sit to Stand: Mod assist  Lateral/Scoot Transfers: Mod assist, From elevated  surface General transfer comment: On multiple attempts this afternoon pt was able to initiate upward movmeent but ultimately lost balance backward and needed increasing assist to keep weight forward/upward moementum    Ambulation / Gait / Stairs / Wheelchair Mobility  Ambulation/Gait Ambulation/Gait assistance: Mod assist, Max assist Gait Distance (Feet): 40 Feet Assistive device: Rolling walker (2 wheeled) General Gait Details: Pt again with great effort during ambulation, however she was unable to take weight through R LE this afternoon and consistently had buckling.  Consistent cuing (with little change in cadence) for extended R knee/WBing, L LE advancement, walker/UE use and overall despite the great effort she needed consistent assist to maintain standing.  Forward flexed posture, minimally corrected with cuing.    Posture / Balance Dynamic Sitting Balance Sitting balance - Comments: initially poor requiring CGA-Min A, improving with time to SBA and UE-BUE support Balance Overall balance assessment: Needs assistance Sitting-balance support: Feet supported, Bilateral upper extremity supported Sitting balance-Leahy Scale: Good Sitting balance - Comments: initially poor requiring CGA-Min A, improving with time to SBA and UE-BUE support Standing balance support: Bilateral upper extremity supported Standing balance-Leahy Scale: Fair Standing balance comment: struggled to attain standing, inconsistent and with buckling during dynamic standing tasks    Special needs/care consideration Skin DM2, receiving Novolog   Previous Home Environment (from acute therapy documentation) Living Arrangements: Children (pt now lives with daughter and daughter's partner)  Lives With: Spouse Available Help at Discharge: Family, Available 24 hours/day Type of Home: House Home Layout: Two level, Able to live on main level with bedroom/bathroom Alternate Level Stairs-Number of Steps: has chair lift; 2nd floor  set up as patient's living area with all needs accessible; dtr reports pt is able to stay on main floor of the home as needed with all needs accessible Home Access: Stairs to enter Entrance Stairs-Rails: Left, Right Entrance Stairs-Number of Steps: 3 Bathroom Toilet: Handicapped height (+ riser w/ rails) Home Care Services: No Additional Comments: shower chair electronically lowers pt into the tub and raises her up so pt can take baths with daughter's assist  Discharge Living Setting Plans for Discharge Living Setting: Patient's home Type of Home at Discharge: House Discharge Home Layout: Able to live on main level with bedroom/bathroom Discharge Home Access: Stairs to enter Entrance Stairs-Rails: Right, Left Entrance Stairs-Number of Steps: 3 Discharge Bathroom Shower/Tub: Tub/shower unit Discharge Bathroom Toilet: Handicapped height Discharge Bathroom Accessibility: Yes How Accessible: Accessible via walker Does the patient have any problems obtaining your medications?: No  Social/Family/Support Systems Patient Roles: Other (Comment) Contact Information: [phone] Anticipated Caregiver: Susan Hall Anticipated Caregiver's Contact Information: [phone] Ability/Limitations of Caregiver: can provide min A Caregiver Availability: 24/7 Discharge Plan Discussed with   Primary Caregiver: Yes Is Caregiver In Agreement with Plan?: No Does Caregiver/Family have Issues with Lodging/Transportation while Pt is in Rehab?: Yes  Goals Patient/Family Goal for Rehab: PT/OT Supervision Expected length of stay: 7-10 days Pt/Family Agrees to Admission and willing to participate: Yes Program Orientation Provided & Reviewed with Pt/Caregiver Including Roles  & Responsibilities: Yes  Decrease burden of Care through IP rehab admission: Specialzed equipment needs, Decrease number of caregivers, Bowel and bladder program, and Patient/family education  Possible need for SNF placement upon  discharge: not anticipated  Patient Condition: I have reviewed medical records from [facility], spoken with CM, and patient and family member. I discussed via phone for inpatient rehabilitation assessment.  Patient will benefit from ongoing PT and OT, can actively participate in 3 hours of therapy a day 5 days of the week, and can make measurable gains during the admission.  Patient will also benefit from the coordinated team approach during an Inpatient Acute Rehabilitation admission.  The patient will receive intensive therapy as well as Rehabilitation physician, nursing, social worker, and care management interventions.  Due to safety, skin/wound care, disease management, medication administration, pain management, and patient education the patient requires 24 hour a day rehabilitation nursing.  The patient is currently min-mod A with mobility and basic ADLs.  Discharge setting and therapy post discharge at home with home health is anticipated.  Patient has agreed to participate in the Acute Inpatient Rehabilitation Program and will admit today.  Preadmission Screen Completed By:  Ry Moody B Terrilynn Postell, 11/22/2020 12:55 PM ______________________________________________________________________   Discussed status with Dr. Swartz on 11/23/20 at 930 and received approval for admission today.  Admission Coordinator:  Ruel Dimmick B Jahanna Raether, CCC-SLP, time 1130 /Date 11/23/20   Assessment/Plan: Diagnosis:right hip fx s/p hip hemi Does the need for close, 24 hr/day Medical supervision in concert with the patient's rehab needs make it unreasonable for this patient to be served in a less intensive setting? Yes Co-Morbidities requiring supervision/potential complications: CAD, HFN, DM, AF Due to bladder management, bowel management, safety, skin/wound care, disease management, medication administration, pain management, and patient education, does the patient require 24 hr/day rehab nursing? Yes Does the  patient require coordinated care of a physician, rehab nurse, PT, OT,  address physical and functional deficits in the context of the above medical diagnosis(es)? Yes Addressing deficits in the following areas: balance, endurance, locomotion, strength, transferring, bowel/bladder control, bathing, dressing, feeding, grooming, toileting, and psychosocial support Can the patient actively participate in an intensive therapy program of at least 3 hrs of therapy 5 days a week? Yes The potential for patient to make measurable gains while on inpatient rehab is excellent Anticipated functional outcomes upon discharge from inpatient rehab: supervision PT, supervision OT, n/a SLP Estimated rehab length of stay to reach the above functional goals is: 7-10 days Anticipated discharge destination: Home 10. Overall Rehab/Functional Prognosis: excellent   MD Signature: Zachary T. Swartz, MD, FAAPMR [place] [facility] 11/23/2020  

## 2020-11-23 NOTE — Plan of Care (Signed)

## 2020-11-23 NOTE — Progress Notes (Signed)
Subjective:  POD #4 s/p right hip hemiarthroplasty.   Patient reports right hip pain as mild.  Patient is seen today in her hospital room with her daughter at the bedside.  Objective:   VITALS:   Vitals:   11/22/20 1845 11/22/20 2159 11/23/20 0350 11/23/20 0809  BP: (!) 129/56 139/77 (!) 145/74 136/83  Pulse: 94 (!) 107 98 95  Resp: 16 18 19 16   Temp: 98.1 F (36.7 C) 98.6 F (37 C) 98.4 F (36.9 C) 98.3 F (36.8 C)  TempSrc:      SpO2: 97% 95% 97% 99%  Weight:      Height:        PHYSICAL EXAM: Right lower extremity Neurovascular intact Sensation intact distally Intact pulses distally Dorsiflexion/Plantar flexion intact Aquacel dressing: scant drainage No cellulitis present Compartment soft  LABS  Results for orders placed or performed during the hospital encounter of 11/16/20 (from the past 24 hour(s))  Glucose, capillary     Status: Abnormal   Collection Time: 11/22/20  4:34 PM  Result Value Ref Range   Glucose-Capillary 151 (H) 70 - 99 mg/dL  Glucose, capillary     Status: Abnormal   Collection Time: 11/22/20 10:01 PM  Result Value Ref Range   Glucose-Capillary 127 (H) 70 - 99 mg/dL   Comment 1 Notify RN    Comment 2 Document in Chart   Glucose, capillary     Status: Abnormal   Collection Time: 11/23/20  7:26 AM  Result Value Ref Range   Glucose-Capillary 128 (H) 70 - 99 mg/dL  CBC with Differential/Platelet     Status: Abnormal   Collection Time: 11/23/20  7:48 AM  Result Value Ref Range   WBC 15.7 (H) 4.0 - 10.5 K/uL   RBC 3.56 (L) 3.87 - 5.11 MIL/uL   Hemoglobin 9.8 (L) 12.0 - 15.0 g/dL   HCT 01/23/21 (L) 62.8 - 36.6 %   MCV 88.5 80.0 - 100.0 fL   MCH 27.5 26.0 - 34.0 pg   MCHC 31.1 30.0 - 36.0 g/dL   RDW 29.4 (H) 76.5 - 46.5 %   Platelets 502 (H) 150 - 400 K/uL   nRBC 0.0 0.0 - 0.2 %   Neutrophils Relative % 74 %   Neutro Abs 11.7 (H) 1.7 - 7.7 K/uL   Lymphocytes Relative 10 %   Lymphs Abs 1.6 0.7 - 4.0 K/uL   Monocytes Relative 11 %    Monocytes Absolute 1.7 (H) 0.1 - 1.0 K/uL   Eosinophils Relative 3 %   Eosinophils Absolute 0.5 0.0 - 0.5 K/uL   Basophils Relative 1 %   Basophils Absolute 0.1 0.0 - 0.1 K/uL   Immature Granulocytes 1 %   Abs Immature Granulocytes 0.17 (H) 0.00 - 0.07 K/uL  Basic metabolic panel     Status: Abnormal   Collection Time: 11/23/20  7:48 AM  Result Value Ref Range   Sodium 135 135 - 145 mmol/L   Potassium 3.5 3.5 - 5.1 mmol/L   Chloride 97 (L) 98 - 111 mmol/L   CO2 30 22 - 32 mmol/L   Glucose, Bld 124 (H) 70 - 99 mg/dL   BUN 30 (H) 8 - 23 mg/dL   Creatinine, Ser 01/23/21 0.44 - 1.00 mg/dL   Calcium 8.7 (L) 8.9 - 10.3 mg/dL   GFR, Estimated 4.65 >68 mL/min   Anion gap 8 5 - 15  Glucose, capillary     Status: Abnormal   Collection Time: 11/23/20 11:28 AM  Result Value  Ref Range   Glucose-Capillary 128 (H) 70 - 99 mg/dL    No results found.  Assessment/Plan: 4 Days Post-Op   Active Problems:   Hip fracture (HCC)   Malnutrition of moderate degree  Patient is stable postop.  She is making progress with physical therapy.  Patient is ready for discharge from an orthopedic standpoint.  She is going to a skilled nursing facility.  Patient will follow up with me in the office in 10 to 14 days.  Patient is on anticoagulation therapy with a Xa inhibitor at baseline for atrial fibrillation which will cover her for DVT prophylaxis.    Juanell Fairly , MD 11/23/2020, 3:06 PM

## 2020-11-23 NOTE — Discharge Summary (Signed)
Physician Discharge Summary  Angelica Ferguson NUU:[id number] DOB: 08-Aug-1932 DOA: 11/16/2020  PCP: Vonna KotykBlandford, Kristen K, NP  Admit date: 11/16/2020 Discharge date: 11/23/2020  Admitted From: Home Disposition: None  Recommendations for Outpatient Follow-up:  Follow up with PCP in 1-2 weeks Follow-up with orthopedics Dr. Martha ClanKrasinski in 10 to 14 days  Home Health: No Equipment/Devices: None  Discharge Condition: Stable CODE STATUS: Full Diet recommendation: Heart healthy  Brief/Interim Summary: 87 y.o. female with a past medical history of persistent atrial fibrillation on edoxaban, coronary artery disease with PCI, hypertension, non-insulin-dependent diabetes mellitus type 2, GERD.  The patient is visiting family from [place].  She had a mechanical fall last Wednesday.  Has been unable to ambulate and is still having right hip pain since.  Therefore came to the emergency department for further evaluation of the right hip pain.  Hemodynamics are stable.  Plain film imaging shows a right femoral neck fracture with varus angulation.  Orthopedics has been contacted.  Patient took her anticoagulant edoxaban on the day of admission in the morning.  As such surgery will have to be delayed.     Patient underwent right hip hemiarthroplasty on 7/28 with orthopedics.  Tolerated procedure well.  No immediate postoperative complications.   Patient had elevation in white count postoperatively.  Peaked at 21.7 on postop day #2.  Subsequently decreasing.  Chest x-ray and KUB negative for infection.  Urinalysis inconsistent with infection.  White count downtrending on time of discharge.  Pain control improving.  Patient had post operative BM on 7/31.  Stable for discharge.  Excepted a bed at [facility].  Discussed with orthopedics.  Okay for discharge from their standpoint.  We will follow-up with Dr. Martha ClanKrasinski in 10 to 14 days.   Discharge Diagnoses:  Active Problems:   Hip fracture (HCC)   Malnutrition of moderate  degree  Right femoral neck fracture secondary to mechanical fall Patient presented 1 week after mechanical fall Had persistent and worsening pain Imaging significant for right femoral neck fracture with varus angulation Pain well controlled Orthopedics on consult Status post right hip hemiarthroplasty 7/28 Plan: Discharge to CIR.  Postoperative bowel movement recorded.  Continue multimodal pain control.  Continue edoxaban for VTE prophylaxis.  Stable for discharge to CIR.  Follow-up with Dr. Martha ClanKrasinski orthopedics in 10 to 14 days.   Leukocytosis Increased on postoperative day 2, peaked at 21.7 No clinical evidence of infection Decreasing subsequently Chest x-ray and KUB negative Urinalysis not especially convincing for infection Plan: Suspect this is reactive in nature.  Downtrending at time of discharge.  Stable for discharge to [facility].  No evidence of infection.  No indication for antibiotics.   Persistent atrial fibrillation Heart rate controlled Home edoxaban started postoperatively Continue home bisoprolol Continue home Cardizem    Essential hypertension Home ramipril on hold.  Blood pressure controlled   Non-insulin-dependent diabetes mellitus No contrast exposure anticipated Metformin was discontinued in house to avoid hypoglycemia.  I have resumed on home medications.  Recommend 3 times daily AC CBGs post discharge and provider consideration for continuation of metformin   Coronary artery disease Hyperlipidemia Resume high-dose statin Resumed home edoxaban   GERD PTA Prevacid   Leukocytosis Suspect reactive in nature No evidence of infection  Discharge Instructions  Discharge Instructions     Diet - low sodium heart healthy   Complete by: As directed    Increase activity slowly   Complete by: As directed    No wound care   Complete by: As directed  Allergies as of 11/23/2020   No Known Allergies      Medication List     TAKE these  medications    albuterol 108 (90 Base) MCG/ACT inhaler Commonly known as: VENTOLIN HFA Inhale 1 puff into the lungs every 6 (six) hours as needed for wheezing or shortness of breath.   atorvastatin 80 MG tablet Commonly known as: LIPITOR Take 80 mg by mouth every evening.   Bevespi Aerosphere 9-4.8 MCG/ACT Aero Generic drug: Glycopyrrolate-Formoterol Inhale 2 puffs into the lungs 2 (two) times daily.   bisoprolol 5 MG tablet Commonly known as: ZEBETA Take 1-2 mg by mouth See admin instructions. Take 7.5 mg in the morning, and 5 mg in the evening   diltiazem 60 MG tablet Commonly known as: CARDIZEM Take 60 mg by mouth 2 (two) times daily.   edoxaban 30 MG Tabs tablet Commonly known as: SAVAYSA Take 30 mg by mouth daily.   HYDROcodone-acetaminophen 5-325 MG tablet Commonly known as: NORCO/VICODIN Take 1-2 tablets by mouth every 4 (four) hours as needed for moderate pain (pain score 4-6). CIR/SNF use only   lansoprazole 15 MG capsule Commonly known as: PREVACID Take 15 mg by mouth daily.   metFORMIN 500 MG tablet Commonly known as: GLUCOPHAGE Take 500 mg by mouth every evening.   methocarbamol 500 MG tablet Commonly known as: ROBAXIN Take 1 tablet (500 mg total) by mouth every 8 (eight) hours as needed for muscle spasms.   ramipril 10 MG capsule Commonly known as: ALTACE Take 10 mg by mouth daily.   senna-docusate 8.6-50 MG tablet Commonly known as: Senokot-S Take 1 tablet by mouth 2 (two) times daily.        Follow-up Information     Juanell Fairly, MD Follow up.   Specialty: Orthopedic Surgery Why: Call for appointment. Follow up with Dr Nyoka Lint in 10-14 days Contact information: [address] [place] [address] [phone]                No Known Allergies  Consultations: Orthopedics   Procedures/Studies: DG Shoulder Right  Result Date: 11/16/2020 CLINICAL DATA:  Fall/pain EXAM: RIGHT SHOULDER - 2+ VIEW COMPARISON:  None.  FINDINGS: Degenerative changes in the [facility] joint with joint space narrowing and spurring. Glenohumeral joint is maintained. No acute bony abnormality. Specifically, no fracture, subluxation, or dislocation. Soft tissues are intact. IMPRESSION: Degenerative changes in the right AC joint. No acute bony abnormality. Electronically Signed   By: Charlett Nose M.D.   On: 11/16/2020 12:33   DG Abd 1 View  Result Date: 11/21/2020 CLINICAL DATA:  Status post fall. Hip fracture. Patient is now constipated. EXAM: ABDOMEN - 1 VIEW COMPARISON:  None. FINDINGS: Mild stool burden noted within the colon. No dilated loops of large or small bowel. Gas is noted up to the level of the rectum. Status post right hip arthroplasty. IMPRESSION: Nonobstructive bowel gas pattern. Electronically Signed   By: Signa Kell M.D.   On: 11/21/2020 11:42   DG Chest Port 1 View  Result Date: 11/21/2020 CLINICAL DATA:  Fall on 07/20.  Hip fracture. EXAM: PORTABLE CHEST 1 VIEW COMPARISON:  None. FINDINGS: Midline trachea. Normal heart size. Atherosclerosis in the transverse aorta. No pleural effusion or pneumothorax. Diffuse interstitial thickening with more confluent upper lobe reticulonodular opacities with volume loss and hilar retraction superiorly. Right base scarring. No well-defined lobar consolidation. 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 No convincing evidence of acute superimposed process.  Comparison with prior radiographs would be informative if available. No posttraumatic deformity identified. Aortic Atherosclerosis (ICD10-I70.0). Electronically Signed   By: Jeronimo Greaves M.D.   On: 11/21/2020 11:43   DG Hip Port Unilat With Pelvis 1V Right  Result Date: 11/19/2020 CLINICAL DATA:  Status post arthroplasty. EXAM: DG HIP (WITH OR WITHOUT PELVIS) 1V PORT RIGHT COMPARISON:  None. FINDINGS: Right hip prosthesis appears to be well situated. Aspect of  postoperative changes seen in the surrounding soft tissues. IMPRESSION: Status post right hip arthroplasty. Electronically Signed   By: Lupita Raider M.D.   On: 11/19/2020 14:06   DG Hip Unilat W or Wo Pelvis 2-3 Views Right  Result Date: 11/16/2020 CLINICAL DATA:  Fall 1 week ago.  Right hip pain EXAM: DG HIP (WITH OR WITHOUT PELVIS) 2-3V RIGHT COMPARISON:  None. FINDINGS: There is a right femoral neck fracture with varus angulation. No subluxation or dislocation. Left hip unremarkable. SI joints symmetric and unremarkable. IMPRESSION: Right femoral neck fracture with varus angulation. Electronically Signed   By: Charlett Nose M.D.   On: 11/16/2020 12:33   (Echo, Carotid, EGD, Colonoscopy, ERCP)    Subjective:   Discharge Exam: Vitals:   11/23/20 0350 11/23/20 0809  BP: (!) 145/74 136/83  Pulse: 98 95  Resp: 19 16  Temp: 98.4 F (36.9 C) 98.3 F (36.8 C)  SpO2: 97% 99%   Vitals:   11/22/20 1845 11/22/20 2159 11/23/20 0350 11/23/20 0809  BP: (!) 129/56 139/77 (!) 145/74 136/83  Pulse: 94 (!) 107 98 95  Resp: 16 18 19 16   Temp: 98.1 F (36.7 C) 98.6 F (37 C) 98.4 F (36.9 C) 98.3 F (36.8 C)  TempSrc:      SpO2: 97% 95% 97% 99%  Weight:      Height:        General: Pt is alert, awake, not in acute distress Cardiovascular: RRR, S1/S2 +, no rubs, no gallops Respiratory: CTA bilaterally, no wheezing, no rhonchi Abdominal: Soft, NT, ND, bowel sounds + Extremities: Right lower extremity tender to touch.  Dressing CDI    The results of significant diagnostics from this hospitalization (including imaging, microbiology, ancillary and laboratory) are listed below for reference.     Microbiology: Recent Results (from the past 240 hour(s))  Resp Panel by RT-PCR (Flu A&B, Covid) Nasopharyngeal Swab     Status: None   Collection Time: 11/16/20  2:34 PM   Specimen: Nasopharyngeal Swab; Nasopharyngeal(NP) swabs in vial transport medium  Result Value Ref Range Status   SARS  Coronavirus 2 by RT PCR NEGATIVE NEGATIVE Final    Comment: (NOTE) SARS-CoV-2 target nucleic acids are NOT DETECTED.  The SARS-CoV-2 RNA is generally detectable in upper respiratory specimens during the acute phase of infection. The lowest concentration of SARS-CoV-2 viral copies this assay can detect is 138 copies/mL. A negative result does not preclude SARS-Cov-2 infection and should not be used as the sole basis for treatment or other patient management decisions. A negative result may occur with  improper specimen collection/handling, submission of specimen other than nasopharyngeal swab, presence of viral mutation(s) within the areas targeted by this assay, and inadequate number of viral copies(<138 copies/mL). A negative result must be combined with clinical observations, patient history, and epidemiological information. The expected result is Negative.  Fact Sheet for Patients:  11/18/20  Fact Sheet for Healthcare Providers:  BloggerCourse.com  This test is no t yet approved or cleared by the [employer] and  has been authorized  for detection and/or diagnosis of SARS-CoV-2 by FDA under an Emergency Use Authorization (EUA). This EUA will remain  in effect (meaning this test can be used) for the duration of the COVID-19 declaration under Section 564(b)(1) of the Act, 21 U.S.C.section 360bbb-3(b)(1), unless the authorization is terminated  or revoked sooner.       Influenza A by PCR NEGATIVE NEGATIVE Final   Influenza B by PCR NEGATIVE NEGATIVE Final    Comment: (NOTE) The Xpert Xpress SARS-CoV-2/FLU/RSV plus assay is intended as an aid in the diagnosis of influenza from Nasopharyngeal swab specimens and should not be used as a sole basis for treatment. Nasal washings and aspirates are unacceptable for Xpert Xpress SARS-CoV-2/FLU/RSV testing.  Fact Sheet for  Patients: BloggerCourse.com  Fact Sheet for Healthcare Providers: SeriousBroker.it  This test is not yet approved or cleared by the [place] FDA and has been authorized for detection and/or diagnosis of SARS-CoV-2 by FDA under an Emergency Use Authorization (EUA). This EUA will remain in effect (meaning this test can be used) for the duration of the COVID-19 declaration under Section 564(b)(1) of the Act, 21 U.S.C. section 360bbb-3(b)(1), unless the authorization is terminated or revoked.  Performed at [facility], [address] [place], [place] [address]   Surgical PCR screen     Status: None   Collection Time: 11/17/20  4:45 AM   Specimen: Nasal Mucosa; Nasal Swab  Result Value Ref Range Status   MRSA, PCR NEGATIVE NEGATIVE Final   Staphylococcus aureus NEGATIVE NEGATIVE Final    Comment: (NOTE) The Xpert SA Assay (FDA approved for NASAL specimens in patients 60 years of age and older), is one component of a comprehensive surveillance program. It is not intended to diagnose infection nor to guide or monitor treatment. Performed at [facility], [address] [place], [place] [address]   CULTURE, BLOOD (ROUTINE X 2) w Reflex to ID Panel     Status: None (Preliminary result)   Collection Time: 11/21/20  8:25 AM   Specimen: BLOOD  Result Value Ref Range Status   Specimen Description BLOOD BLOOD RIGHT HAND  Final   Special Requests   Final    BOTTLES DRAWN AEROBIC AND ANAEROBIC Blood Culture adequate volume   Culture   Final    NO GROWTH 2 DAYS Performed at [facility], [address], [place], [place] [address]    Report Status PENDING  Incomplete  CULTURE, BLOOD (ROUTINE X 2) w Reflex to ID Panel     Status: None (Preliminary result)   Collection Time: 11/21/20  8:35 AM   Specimen: BLOOD  Result Value Ref Range Status   Specimen Description BLOOD BLOOD LEFT HAND  Final   Special  Requests   Final    BOTTLES DRAWN AEROBIC AND ANAEROBIC Blood Culture adequate volume   Culture   Final    NO GROWTH 2 DAYS Performed at [facility], [address], [place], [place] [address]    Report Status PENDING  Incomplete     Labs: BNP (last 3 results) No results for input(s): BNP in the last 8760 hours. Basic Metabolic Panel: Recent Labs  Lab 11/16/20 1434 11/19/20 0350 11/20/20 0604 11/21/20 0542 11/23/20 0748  NA 134* 135 135 135 135  K 4.4 4.8 4.2 4.2 3.5  CL 101 98 101 98 97*  CO2 24 28 29 26 30   GLUCOSE 111* 121* 142* 129* 124*  BUN 25* 45* 27* 29* 30*  CREATININE 0.86 1.04* 0.80  0.63 0.78  CALCIUM 8.9 8.9 8.4* 9.2 8.7*  MG  --  1.9  --   --   --    Liver Function Tests: Recent Labs  Lab 11/16/20 1434  AST 27  ALT 21  ALKPHOS 104  BILITOT 1.0  PROT 6.5  ALBUMIN 3.1*   No results for input(s): LIPASE, AMYLASE in the last 168 hours. No results for input(s): AMMONIA in the last 168 hours. CBC: Recent Labs  Lab 11/16/20 1434 11/19/20 0350 11/20/20 0604 11/21/20 0542 11/22/20 0636 11/23/20 0748  WBC 13.8* 12.5* 17.9* 21.7* 17.1* 15.7*  NEUTROABS 9.8* 9.0*  --   --   --  11.7*  HGB 11.9* 10.4* 9.7* 9.5* 9.1* 9.8*  HCT 37.5 32.7* 30.2* 31.0* 29.0* 31.5*  MCV 87.6 87.4 87.8 90.1 88.1 88.5  PLT 367 400 355 347 360 502*   Cardiac Enzymes: No results for input(s): CKTOTAL, CKMB, CKMBINDEX, TROPONINI in the last 168 hours. BNP: Invalid input(s): POCBNP CBG: Recent Labs  Lab 11/22/20 0742 11/22/20 1131 11/22/20 1634 11/22/20 2201 11/23/20 0726  GLUCAP 105* 183* 151* 127* 128*   D-Dimer No results for input(s): DDIMER in the last 72 hours. Hgb A1c No results for input(s): HGBA1C in the last 72 hours. Lipid Profile No results for input(s): CHOL, HDL, LDLCALC, TRIG, CHOLHDL, LDLDIRECT in the last 72 hours. Thyroid function studies No results for input(s): TSH, T4TOTAL, T3FREE, THYROIDAB in the last 72 hours.  Invalid input(s):  FREET3 Anemia work up No results for input(s): VITAMINB12, FOLATE, FERRITIN, TIBC, IRON, RETICCTPCT in the last 72 hours. Urinalysis    Component Value Date/Time   COLORURINE AMBER (A) 11/21/2020 1011   APPEARANCEUR CLOUDY (A) 11/21/2020 1011   LABSPEC 1.031 (H) 11/21/2020 1011   PHURINE 5.0 11/21/2020 1011   GLUCOSEU NEGATIVE 11/21/2020 1011   HGBUR NEGATIVE 11/21/2020 1011   BILIRUBINUR NEGATIVE 11/21/2020 1011   KETONESUR 5 (A) 11/21/2020 1011   PROTEINUR 30 (A) 11/21/2020 1011   NITRITE NEGATIVE 11/21/2020 1011   LEUKOCYTESUR MODERATE (A) 11/21/2020 1011   Sepsis Labs Invalid input(s): PROCALCITONIN,  WBC,  LACTICIDVEN Microbiology Recent Results (from the past 240 hour(s))  Resp Panel by RT-PCR (Flu A&B, Covid) Nasopharyngeal Swab     Status: None   Collection Time: 11/16/20  2:34 PM   Specimen: Nasopharyngeal Swab; Nasopharyngeal(NP) swabs in vial transport medium  Result Value Ref Range Status   SARS Coronavirus 2 by RT PCR NEGATIVE NEGATIVE Final    Comment: (NOTE) SARS-CoV-2 target nucleic acids are NOT DETECTED.  The SARS-CoV-2 RNA is generally detectable in upper respiratory specimens during the acute phase of infection. The lowest concentration of SARS-CoV-2 viral copies this assay can detect is 138 copies/mL. A negative result does not preclude SARS-Cov-2 infection and should not be used as the sole basis for treatment or other patient management decisions. A negative result may occur with  improper specimen collection/handling, submission of specimen other than nasopharyngeal swab, presence of viral mutation(s) within the areas targeted by this assay, and inadequate number of viral copies(<138 copies/mL). A negative result must be combined with clinical observations, patient history, and epidemiological information. The expected result is Negative.  Fact Sheet for Patients:  BloggerCourse.com  Fact Sheet for Healthcare Providers:   SeriousBroker.it  This test is no t yet approved or cleared by the [place] FDA and  has been authorized for detection and/or diagnosis of SARS-CoV-2 by FDA under an Emergency Use Authorization (EUA). This EUA will remain  in effect (meaning  this test can be used) for the duration of the COVID-19 declaration under Section 564(b)(1) of the Act, 21 U.S.C.section 360bbb-3(b)(1), unless the authorization is terminated  or revoked sooner.       Influenza A by PCR NEGATIVE NEGATIVE Final   Influenza B by PCR NEGATIVE NEGATIVE Final    Comment: (NOTE) The Xpert Xpress SARS-CoV-2/FLU/RSV plus assay is intended as an aid in the diagnosis of influenza from Nasopharyngeal swab specimens and should not be used as a sole basis for treatment. Nasal washings and aspirates are unacceptable for Xpert Xpress SARS-CoV-2/FLU/RSV testing.  Fact Sheet for Patients: BloggerCourse.com  Fact Sheet for Healthcare Providers: SeriousBroker.it  This test is not yet approved or cleared by the [place] FDA and has been authorized for detection and/or diagnosis of SARS-CoV-2 by FDA under an Emergency Use Authorization (EUA). This EUA will remain in effect (meaning this test can be used) for the duration of the COVID-19 declaration under Section 564(b)(1) of the Act, 21 U.S.C. section 360bbb-3(b)(1), unless the authorization is terminated or revoked.  Performed at [facility], [address], [place], [place] [address]   Surgical PCR screen     Status: None   Collection Time: 11/17/20  4:45 AM   Specimen: Nasal Mucosa; Nasal Swab  Result Value Ref Range Status   MRSA, PCR NEGATIVE NEGATIVE Final   Staphylococcus aureus NEGATIVE NEGATIVE Final    Comment: (NOTE) The Xpert SA Assay (FDA approved for NASAL specimens in patients 55 years of age and older), is one component of a comprehensive surveillance  program. It is not intended to diagnose infection nor to guide or monitor treatment. Performed at [facility], [address] [place], [place] [address]   CULTURE, BLOOD (ROUTINE X 2) w Reflex to ID Panel     Status: None (Preliminary result)   Collection Time: 11/21/20  8:25 AM   Specimen: BLOOD  Result Value Ref Range Status   Specimen Description BLOOD BLOOD RIGHT HAND  Final   Special Requests   Final    BOTTLES DRAWN AEROBIC AND ANAEROBIC Blood Culture adequate volume   Culture   Final    NO GROWTH 2 DAYS Performed at [facility], [address] [place], [place] [address]    Report Status PENDING  Incomplete  CULTURE, BLOOD (ROUTINE X 2) w Reflex to ID Panel     Status: None (Preliminary result)   Collection Time: 11/21/20  8:35 AM   Specimen: BLOOD  Result Value Ref Range Status   Specimen Description BLOOD BLOOD LEFT HAND  Final   Special Requests   Final    BOTTLES DRAWN AEROBIC AND ANAEROBIC Blood Culture adequate volume   Culture   Final    NO GROWTH 2 DAYS Performed at [facility], [address], [place], [place] [address]    Report Status PENDING  Incomplete     Time coordinating discharge: Over 30 minutes  SIGNED:   Tresa Moore, MD  [facility] 11/23/2020, 11:12 AM Pager   If 7PM-7AM, please contact night-coverage

## 2020-11-23 NOTE — Progress Notes (Signed)
Inpatient Rehab Admissions Coordinator:     I have a bed for this pt. On [facility]. [facility] scheduled for pickup at 2pm. RN may call report  to [phone]/  Megan Salon, MS, CCC-SLP Rehab Admissions Coordinator  [phone] (celll) [phone] (office)

## 2020-11-23 NOTE — TOC Progression Note (Signed)
Transition of Care [facility]) - Progression Note    Patient Details  Name: Angelica Ferguson MRN: [id number] Date of Birth: 01-15-1933  Transition of Care [facility]) CM/SW Contact  Barrie Dunker, RN Phone Number: 11/23/2020, 11:25 AM  Clinical Narrative:     Patient has been accepted with a bed to CIR< Transport arranged by [facility] for [facility], DC packet on the chart, the patient's daughter is aware       Expected Discharge Plan and Services           Expected Discharge Date: 11/23/20                                     Social Determinants of Health (SDOH) Interventions    Readmission Risk Interventions No flowsheet data found.

## 2020-11-23 NOTE — Progress Notes (Signed)
Inpatient Rehabilitation Medication Review by a Pharmacist  A complete drug regimen review was completed for this patient to identify any potential clinically significant medication issues.  Clinically significant medication issues were identified:  no  Check AMION for pharmacist assigned to patient if future medication questions/issues arise during this admission.  Pharmacist comments:   Time spent performing this drug regimen review (minutes):  5 minutes   Angelica Ferguson 11/23/2020 9:09 PM

## 2020-11-23 NOTE — H&P (Signed)
Physical Medicine and Rehabilitation Admission H&P    Chief Complaint  Patient presents with   Hip fracture with functional deficits    HPI: Angelica Ferguson is an 87 year old female with  history of T2DM, CAF- on endo HTN who was admitted on 11/16/20 to [facility] after fall 07/20 with progressive pain and difficulty walking. She was found to have right femoral neck fracture with angulation. Endoxaban placed on hold and she underwent right hip hemi on 07/28 by Dr. Nyoka Lint. Post op WBAT with hip precautions post op has had pain and lethargy affecting mobility and ADLs. She had rise in WBC to 21.7  and UC/CXR done which were negative for infection. CIR recommended due to functional decline.    Review of Systems  Constitutional:  Negative for chills and fever.  HENT:  Positive for hearing loss. Negative for tinnitus.   Eyes:  Positive for blurred vision.  Respiratory:  Negative for cough and shortness of breath.   Cardiovascular:  Negative for chest pain and palpitations.  Gastrointestinal:  Negative for constipation, heartburn and nausea.  Genitourinary:  Negative for dysuria and urgency.  Musculoskeletal:  Positive for joint pain and myalgias.       Coccyx pain since the fall  Skin:  Negative for itching and rash.  Neurological:  Positive for weakness. Negative for dizziness.  Psychiatric/Behavioral:  The patient is not nervous/anxious and does not have insomnia.     Past Medical History:  Diagnosis Date   Atrial fibrillation (HCC)    Diabetes mellitus without complication (HCC)    GERD (gastroesophageal reflux disease)    Hyperlipidemia    Hypertension     Past Surgical History:  Procedure Laterality Date   CATARACT EXTRACTION Left    CORONARY/GRAFT ACUTE MI REVASCULARIZATION     HIP ARTHROPLASTY Right 11/19/2020   Procedure: ARTHROPLASTY BIPOLAR HIP (HEMIARTHROPLASTY);  Surgeon: Juanell Fairly, MD;  Location: [facility] ORS;  Service: Orthopedics;  Laterality: Right;   KNEE  SURGERY Left    NO PAST SURGERIES      Family History  Problem Relation Age of Onset   Heart disease Sister    Cancer Sister     Social History: widowed. She reports that she has quit smoking. Her smoking use included cigarettes. She has never used smokeless tobacco. She reports previous alcohol use. She reports that she does not use drugs.   Allergies: No Known Allergies   Medications Prior to Admission  Medication Sig Dispense Refill   albuterol (VENTOLIN HFA) 108 (90 Base) MCG/ACT inhaler Inhale 1 puff into the lungs every 6 (six) hours as needed for wheezing or shortness of breath.     atorvastatin (LIPITOR) 80 MG tablet Take 80 mg by mouth every evening.     bisoprolol (ZEBETA) 5 MG tablet Take 1-2 mg by mouth See admin instructions. Take 7.5 mg in the morning, and 5 mg in the evening     diltiazem (CARDIZEM) 60 MG tablet Take 60 mg by mouth 2 (two) times daily.     edoxaban (SAVAYSA) 30 MG TABS tablet Take 30 mg by mouth daily.     Glycopyrrolate-Formoterol (BEVESPI AEROSPHERE) 9-4.8 MCG/ACT AERO Inhale 2 puffs into the lungs 2 (two) times daily.     lansoprazole (PREVACID) 15 MG capsule Take 15 mg by mouth daily.     metFORMIN (GLUCOPHAGE) 500 MG tablet Take 500 mg by mouth every evening.     ramipril (ALTACE) 10 MG capsule Take 10 mg by mouth daily.  Drug Regimen Review  Drug regimen was reviewed and remains appropriate with no significant issues identified  Home: Home Living Family/patient expects to be discharged to:: Private residence Living Arrangements: Children (pt now lives with daughter and daughter's partner) Available Help at Discharge: Family, Available 24 hours/day Type of Home: House Home Access: Stairs to enter Secretary/administrator of Steps: 3 Entrance Stairs-Rails: Left, Right Home Layout: Two level, Able to live on main level with bedroom/bathroom Alternate Level Stairs-Number of Steps: has chair lift; 2nd floor set up as patient's living area  with all needs accessible; dtr reports pt is able to stay on main floor of the home as needed with all needs accessible Bathroom Toilet: Handicapped height (+ riser w/ rails) Home Equipment: Cane - single point, Toilet riser, Other (comment), Bedside commode, Shower seat Additional Comments: shower chair electronically lowers pt into the tub and raises her up so pt can take baths with daughter's assist  Lives With: Spouse   Functional History: Prior Function Level of Independence: Needs assistance Gait / Transfers Assistance Needed: pt was ambulating without AD or assist prior to fall, able to get up main steps in home and then took chair lift up to 2nd floor ADL's / Homemaking Assistance Needed: pt indep with basic ADL except had assist from dtr for tub transfers and bathing; family assist for transportation, medication mgt, meals, and cleaning Comments: No additional falls.  Functional Status:  Mobility: Bed Mobility Overal bed mobility: Needs Assistance Bed Mobility: Supine to Sit Supine to sit: Min assist Sit to supine: Mod assist General bed mobility comments: Great effort today, showed ability to slowly scoot toward EOB, help to ease R LE off EOB safely, only very light assist (and heavy UE use and effort from pt) to lift torso to EOB Transfers Overall transfer level: Needs assistance Equipment used: Rolling walker (2 wheeled), 1 person hand held assist Transfers: Sit to/from Stand Sit to Stand: Mod assist  Lateral/Scoot Transfers: Mod assist, From elevated surface General transfer comment: Pt showed better control in keeping weight/hips shifting forward, still needing assist to insure she did not sit back down Ambulation/Gait Ambulation/Gait assistance: Min assist Gait Distance (Feet): 50 Feet Assistive device: Rolling walker (2 wheeled) General Gait Details: Pt again showing great effort with ambulation.  With cuing she was better able to slow and be more deliberate with her  cadence and ability to keep R knee extended during WBing.  She still had some occasional buckling, but not nearly as severe of frequent as previous efforts.  Only very rare need for PT to assist physically, heavy VCs t/o the effort, daughter following with recliner.    ADL: ADL Overall ADL's : Needs assistance/impaired General ADL Comments: Pt currently requires MAX A for bed level toileting, MAX A for seated LB dressing involving lateral leans, and set up and PRN Min A for seated UB ADL. Set up and SBA for seated grooming tasks. Primarily pain limited with movement of RLE.  Cognition: Cognition Overall Cognitive Status: History of cognitive impairments - at baseline Orientation Level: Oriented X4 Cognition Arousal/Alertness: Awake/alert Behavior During Therapy: WFL for tasks assessed/performed Overall Cognitive Status: History of cognitive impairments - at baseline General Comments: per DTR very minimal signs of cognitive decline.   Blood pressure 136/83, pulse 95, temperature 98.3 F (36.8 C), resp. rate 16, height 5\' 3"  (1.6 m), weight 40.8 kg, SpO2 99 %. Physical Exam Vitals and nursing note reviewed.  Constitutional:      Appearance: Normal appearance.  Cardiovascular:     Rate and Rhythm: Rhythm regularly irregular.  Pulmonary:     Breath sounds: Wheezing (occasional wheeze) present.  Musculoskeletal:     Comments: Right hip incision with surgical dressing--soaked with bloody drainage. Staples intact.   Neurological:     Mental Status: She is alert and oriented to person, place, and time.     Comments: HOH. Able to follow simple motor commands.     Results for orders placed or performed during the hospital encounter of 11/16/20 (from the past 48 hour(s))  Glucose, capillary     Status: Abnormal   Collection Time: 11/21/20  4:30 PM  Result Value Ref Range   Glucose-Capillary 141 (H) 70 - 99 mg/dL    Comment: Glucose reference range applies only to samples taken after  fasting for at least 8 hours.  Glucose, capillary     Status: Abnormal   Collection Time: 11/21/20  9:28 PM  Result Value Ref Range   Glucose-Capillary 142 (H) 70 - 99 mg/dL    Comment: Glucose reference range applies only to samples taken after fasting for at least 8 hours.   Comment 1 Notify RN   CBC     Status: Abnormal   Collection Time: 11/22/20  6:36 AM  Result Value Ref Range   WBC 17.1 (H) 4.0 - 10.5 K/uL   RBC 3.29 (L) 3.87 - 5.11 MIL/uL   Hemoglobin 9.1 (L) 12.0 - 15.0 g/dL   HCT 32.3 (L) 55.7 - 32.2 %   MCV 88.1 80.0 - 100.0 fL   MCH 27.7 26.0 - 34.0 pg   MCHC 31.4 30.0 - 36.0 g/dL   RDW 02.5 (H) 42.7 - 06.2 %   Platelets 360 150 - 400 K/uL   nRBC 0.0 0.0 - 0.2 %    Comment: Performed at [facility], [address] [place], [place] [address]  Glucose, capillary     Status: Abnormal   Collection Time: 11/22/20  7:42 AM  Result Value Ref Range   Glucose-Capillary 105 (H) 70 - 99 mg/dL    Comment: Glucose reference range applies only to samples taken after fasting for at least 8 hours.  Glucose, capillary     Status: Abnormal   Collection Time: 11/22/20 11:31 AM  Result Value Ref Range   Glucose-Capillary 183 (H) 70 - 99 mg/dL    Comment: Glucose reference range applies only to samples taken after fasting for at least 8 hours.  Glucose, capillary     Status: Abnormal   Collection Time: 11/22/20  4:34 PM  Result Value Ref Range   Glucose-Capillary 151 (H) 70 - 99 mg/dL    Comment: Glucose reference range applies only to samples taken after fasting for at least 8 hours.  Glucose, capillary     Status: Abnormal   Collection Time: 11/22/20 10:01 PM  Result Value Ref Range   Glucose-Capillary 127 (H) 70 - 99 mg/dL    Comment: Glucose reference range applies only to samples taken after fasting for at least 8 hours.   Comment 1 Notify RN    Comment 2 Document in Chart   Glucose, capillary     Status: Abnormal   Collection Time: 11/23/20  7:26 AM  Result Value  Ref Range   Glucose-Capillary 128 (H) 70 - 99 mg/dL    Comment: Glucose reference range applies only to samples taken after fasting for at least 8 hours.  CBC with Differential/Platelet     Status: Abnormal   Collection  Time: 11/23/20  7:48 AM  Result Value Ref Range   WBC 15.7 (H) 4.0 - 10.5 K/uL   RBC 3.56 (L) 3.87 - 5.11 MIL/uL   Hemoglobin 9.8 (L) 12.0 - 15.0 g/dL   HCT 40.9 (L) 81.1 - 91.4 %   MCV 88.5 80.0 - 100.0 fL   MCH 27.5 26.0 - 34.0 pg   MCHC 31.1 30.0 - 36.0 g/dL   RDW 78.2 (H) 95.6 - 21.3 %   Platelets 502 (H) 150 - 400 K/uL   nRBC 0.0 0.0 - 0.2 %   Neutrophils Relative % 74 %   Neutro Abs 11.7 (H) 1.7 - 7.7 K/uL   Lymphocytes Relative 10 %   Lymphs Abs 1.6 0.7 - 4.0 K/uL   Monocytes Relative 11 %   Monocytes Absolute 1.7 (H) 0.1 - 1.0 K/uL   Eosinophils Relative 3 %   Eosinophils Absolute 0.5 0.0 - 0.5 K/uL   Basophils Relative 1 %   Basophils Absolute 0.1 0.0 - 0.1 K/uL   Immature Granulocytes 1 %   Abs Immature Granulocytes 0.17 (H) 0.00 - 0.07 K/uL    Comment: Performed at [facility], [address] [place], [place] [address]  Basic metabolic panel     Status: Abnormal   Collection Time: 11/23/20  7:48 AM  Result Value Ref Range   Sodium 135 135 - 145 mmol/L   Potassium 3.5 3.5 - 5.1 mmol/L   Chloride 97 (L) 98 - 111 mmol/L   CO2 30 22 - 32 mmol/L   Glucose, Bld 124 (H) 70 - 99 mg/dL    Comment: Glucose reference range applies only to samples taken after fasting for at least 8 hours.   BUN 30 (H) 8 - 23 mg/dL   Creatinine, Ser 8.46 0.44 - 1.00 mg/dL   Calcium 8.7 (L) 8.9 - 10.3 mg/dL   GFR, Estimated >96 >29 mL/min    Comment: (NOTE) Calculated using the CKD-EPI Creatinine Equation (2021)    Anion gap 8 5 - 15    Comment: Performed at [facility], [address] [place], [place] [address]  Glucose, capillary     Status: Abnormal   Collection Time: 11/23/20 11:28 AM  Result Value Ref Range   Glucose-Capillary 128 (H) 70 - 99  mg/dL    Comment: Glucose reference range applies only to samples taken after fasting for at least 8 hours.   No results found.     Medical Problem List and Plan: 1.  Functional and mobility deficits secondary to right femoral neck fx after fall 7/20. Right hip hemi performed 7/28  -patient may shower  -ELOS/Goals: 7-10 days, supervision with PT/OT 2.  Antithrombotics: -DVT/anticoagulation:  Pharmaceutical: Other (comment)--Savayas daily  -antiplatelet therapy: N/A 3. Pain Management: Hydrocodone prn.  4. Mood: LCSW to follow for evaluation and support.   -antipsychotic agents: N/A 5. Neuropsych: This patient is capable of making decisions on her own behalf. 6. Skin/Wound Care: Routine pressure relief measures. Daily dressing changes.  7. Fluids/Electrolytes/Nutrition: Monitor I/O. Check lytes in am.  8. A fib: Monitor HR TID--continue endoxaban daily --bisoprolol and Cardizem bid. 9. HTN: Monitor BP tid--watch for orthostatic changes.  10.  T2DM: Hgb A1C-6.2--was on metformin PTA. --will monitor BS ac/hs and use SSI  --Will check BMET in am prior to resuming metformin  11. Leucocytosis: Trending down again--monitor for fevers and other signs of infection.  12. ABLA: Stable. Recheck CBC in am.  13. Acute renal failure: Encourage fluid intake--recheck BUN  in am.       Jacquelynn Creeamela S Love, PA-C 11/23/2020

## 2020-11-23 NOTE — Progress Notes (Signed)
Occupational Therapy Treatment Patient Details Name: Angelica Ferguson MRN: [id number] DOB: April 23, 1933 Today's Date: 11/23/2020    History of present illness Angelica Ferguson is an 87yoF who comes to [facility] on 7/25 after fall on Wed 7/20, found to have hip fracture. Pt is a [employer], here since November '21 living with DTR, LT plan to relocate here permanenetly. Pt went to OR on 7/28 c Dr. Juanell Fairly for Rt hemi hip arthroplasty, posterolateral approach. PMH: AF on edoxaban, CAD s/p PCI, HTN, NIDDM2, GERD. Pt has posterior hip precautions and is WBAT.   OT comments  Pt seen for OT tx this morning. Dtr present and engaged/supportive throughout. Pt pleasant, oriented, and able to recall 3/3 posterior hip precautions without cues. Performed bed mobility sup>sit EOB with supervision for safety. Pt stood with RW requiring MOD A from standard height bed and cues for strategy to maintain precautions. Pt took a couple steps forward to stand within the RW at sink for grooming tasks requiring intermittent UE vs BUE support on counter for balance. Pt briefly tolerated standing with CGA and no UE support on RW to apply hand lotion and was able to self correct when she experienced a slight LOB. Pt instructed in RW mgt and maintaining precautions with turns. Pt required MIN A for RLE mgt back to bed at end of session. Pt denied pain throughout (was premedicated). Pt progressing towards goals. Continue to strongly recommend CIR for high intensity skilled OT services.    Follow Up Recommendations  CIR    Equipment Recommendations  Other (comment) (2WW)    Recommendations for Other Services      Precautions / Restrictions Precautions Precautions: Posterior Hip;Fall Precaution Comments: Pt able to recall 3/3 with no cuing, cues required for how to implement precautions during functional tasks Restrictions Weight Bearing Restrictions: Yes RLE Weight Bearing: Weight bearing as tolerated        Mobility Bed Mobility Overal bed mobility: Needs Assistance Bed Mobility: Supine to Sit;Sit to Supine     Supine to sit: Supervision;HOB elevated Sit to supine: Min assist   General bed mobility comments: cautious, but able to sit EOB without direct assist to complete, SUPV for safety to maintain precautions, MIN A for RLE mgt back to bed    Transfers Overall transfer level: Needs assistance Equipment used: Rolling walker (2 wheeled) Transfers: Sit to/from Stand Sit to Stand: Mod assist         General transfer comment: MOD A for STS from standard height bed, cues for precautions    Balance Overall balance assessment: Needs assistance Sitting-balance support: No upper extremity supported;Feet supported Sitting balance-Leahy Scale: Good     Standing balance support: No upper extremity supported;During functional activity Standing balance-Leahy Scale: Fair Standing balance comment: able to tolerate static standing balance briefly, mild lob noted with pt self correcting                           ADL either performed or assessed with clinical judgement   ADL Overall ADL's : Needs assistance/impaired     Grooming: Standing;Min guard;Wash/dry face;Wash/dry hands Grooming Details (indicate cue type and reason): Pt tolerated standing at sink with UE vs BUE support on counter for balance to perform grooming tasks. Was briefly able to stand without UE support to apply hand lotion with slight LOB noted and pt able to self correct.  Vision Patient Visual Report: No change from baseline     Perception     Praxis      Cognition Arousal/Alertness: Awake/alert Behavior During Therapy: WFL for tasks assessed/performed Overall Cognitive Status: History of cognitive impairments - at baseline                                 General Comments: per DTR very minimal signs of cognitive decline. Pt alert and  oriented during session, able to recall 3/3 precautions without cues. Cues during ADL/mobility for sequencing in order to maintain precautions, follows comands well        Exercises     Shoulder Instructions       General Comments      Pertinent Vitals/ Pain       Pain Assessment: No/denies pain  Home Living                                          Prior Functioning/Environment              Frequency  Min 2X/week        Progress Toward Goals  OT Goals(current goals can now be found in the care plan section)  Progress towards OT goals: Progressing toward goals  Acute Rehab OT Goals Patient Stated Goal: get back to being independent OT Goal Formulation: With patient/family Time For Goal Achievement: 12/04/20 Potential to Achieve Goals: Good  Plan Discharge plan remains appropriate;Frequency remains appropriate    Co-evaluation                 AM-PAC OT "6 Clicks" Daily Activity     Outcome Measure   Help from another person eating meals?: None Help from another person taking care of personal grooming?: A Little Help from another person toileting, which includes using toliet, bedpan, or urinal?: A Little Help from another person bathing (including washing, rinsing, drying)?: A Lot Help from another person to put on and taking off regular upper body clothing?: A Little Help from another person to put on and taking off regular lower body clothing?: A Lot 6 Click Score: 17    End of Session Equipment Utilized During Treatment: Gait belt;Rolling walker  OT Visit Diagnosis: Other abnormalities of gait and mobility (R26.89);Muscle weakness (generalized) (M62.81);History of falling (Z91.81)   Activity Tolerance Patient tolerated treatment well   Patient Left in bed;with call bell/phone within reach;with bed alarm set;with family/visitor present;with SCD's reapplied   Nurse Communication          Time: 8264-1583 OT Time Calculation  (min): 42 min  Charges: OT General Charges $OT Visit: 1 Visit OT Treatments $Self Care/Home Management : 38-52 mins  Wynona Canes, MPH, MS, OTR/L ascom [phone] 11/23/20, 12:59 PM

## 2020-11-24 DIAGNOSIS — I482 Chronic atrial fibrillation, unspecified: Secondary | ICD-10-CM

## 2020-11-24 DIAGNOSIS — D62 Acute posthemorrhagic anemia: Secondary | ICD-10-CM

## 2020-11-24 DIAGNOSIS — S72001S Fracture of unspecified part of neck of right femur, sequela: Secondary | ICD-10-CM

## 2020-11-24 DIAGNOSIS — L8990 Pressure ulcer of unspecified site, unspecified stage: Secondary | ICD-10-CM | POA: Insufficient documentation

## 2020-11-24 DIAGNOSIS — R7989 Other specified abnormal findings of blood chemistry: Secondary | ICD-10-CM | POA: Diagnosis not present

## 2020-11-24 HISTORY — DX: Pressure ulcer of unspecified site, unspecified stage: L89.90

## 2020-11-24 LAB — CBC WITH DIFFERENTIAL/PLATELET
Abs Immature Granulocytes: 0.15 10*3/uL — ABNORMAL HIGH (ref 0.00–0.07)
Basophils Absolute: 0.1 10*3/uL (ref 0.0–0.1)
Basophils Relative: 1 %
Eosinophils Absolute: 0.4 10*3/uL (ref 0.0–0.5)
Eosinophils Relative: 3 %
HCT: 29.4 % — ABNORMAL LOW (ref 36.0–46.0)
Hemoglobin: 9.4 g/dL — ABNORMAL LOW (ref 12.0–15.0)
Immature Granulocytes: 1 %
Lymphocytes Relative: 10 %
Lymphs Abs: 1.4 10*3/uL (ref 0.7–4.0)
MCH: 27.5 pg (ref 26.0–34.0)
MCHC: 32 g/dL (ref 30.0–36.0)
MCV: 86 fL (ref 80.0–100.0)
Monocytes Absolute: 1.4 10*3/uL — ABNORMAL HIGH (ref 0.1–1.0)
Monocytes Relative: 10 %
Neutro Abs: 11.1 10*3/uL — ABNORMAL HIGH (ref 1.7–7.7)
Neutrophils Relative %: 75 %
Platelets: 477 10*3/uL — ABNORMAL HIGH (ref 150–400)
RBC: 3.42 MIL/uL — ABNORMAL LOW (ref 3.87–5.11)
RDW: 16.3 % — ABNORMAL HIGH (ref 11.5–15.5)
WBC: 14.4 10*3/uL — ABNORMAL HIGH (ref 4.0–10.5)
nRBC: 0 % (ref 0.0–0.2)

## 2020-11-24 LAB — GLUCOSE, CAPILLARY
Glucose-Capillary: 132 mg/dL — ABNORMAL HIGH (ref 70–99)
Glucose-Capillary: 178 mg/dL — ABNORMAL HIGH (ref 70–99)
Glucose-Capillary: 143 mg/dL — ABNORMAL HIGH (ref 70–99)
Glucose-Capillary: 148 mg/dL — ABNORMAL HIGH (ref 70–99)

## 2020-11-24 LAB — COMPREHENSIVE METABOLIC PANEL
ALT: 16 U/L (ref 0–44)
AST: 25 U/L (ref 15–41)
Albumin: 2.1 g/dL — ABNORMAL LOW (ref 3.5–5.0)
Alkaline Phosphatase: 86 U/L (ref 38–126)
Anion gap: 6 (ref 5–15)
BUN: 23 mg/dL (ref 8–23)
CO2: 30 mmol/L (ref 22–32)
Calcium: 8.8 mg/dL — ABNORMAL LOW (ref 8.9–10.3)
Chloride: 98 mmol/L (ref 98–111)
Creatinine, Ser: 0.95 mg/dL (ref 0.44–1.00)
GFR, Estimated: 59 mL/min — ABNORMAL LOW (ref >60)
Glucose, Bld: 140 mg/dL — ABNORMAL HIGH (ref 70–99)
Potassium: 3.9 mmol/L (ref 3.5–5.1)
Sodium: 134 mmol/L — ABNORMAL LOW (ref 135–145)
Total Bilirubin: 0.9 mg/dL (ref 0.3–1.2)
Total Protein: 5.1 g/dL — ABNORMAL LOW (ref 6.5–8.1)

## 2020-11-24 NOTE — Patient Care Conference (Signed)
Inpatient RehabilitationTeam Conference and Plan of Care Update Date: 11/24/2020   Time: 11:22 AM    Patient Name: Angelica Ferguson      Medical Record Number: [id number]  Date of Birth: 07/10/1932 Sex: Female         Room/Bed: 4S56C/1E75T-70 Payor Info: Payor: /    Admit Date/Time:  11/23/2020  5:04 PM  Primary Diagnosis:  Closed fracture of right hip requiring operative repair, sequela  Hospital Problems: Principal Problem:   Closed fracture of right hip requiring operative repair, sequela Active Problems:   Pressure injury of skin    Expected Discharge Date: Expected Discharge Date: 12/03/20  Team Members Present: Physician leading conference: Dr. Faith Rogue Social Worker Present: Cecile Sheerer, LCSWA Nurse Present: Kennyth Arnold, RN PT Present: Peter Congo, PT OT Present: Earleen Newport, OT PPS Coordinator present : Fae Pippin, SLP     Current Status/Progress Goal Weekly Team Focus  Bowel/Bladder   using bedpan, increased frequency, UA/UC sent on 7/30  continent of B & B, regular BM's  get up to [facility] OR BR for all toileting needs.   Swallow/Nutrition/ Hydration             ADL's   Min A LB bathe/dress d/t precautions, reduced cognition, daughter assists and will provide 24/7, stand pivot CGA/Min A w/ RW  Supervision  standing balance/tolerance, AE PRN, recall/adhere precautions, ADL retraining, family training/educaion   Mobility   min A overall with RW, gait x 75 ft RW min A  Supervision overall  PT evaluation   Communication             Safety/Cognition/ Behavioral Observations  no unsafe behaviors observed thus far         Pain   Intermittent C/O right hip pain  pain score < 3 on pain scale      Skin   right hip incision with staples & dressing. Daily dressing changes to right hip.  skin/incision without signs or symptoms of infection  monitor incision skin every shift and PRN     Discharge Planning:  Pt to be assessed. Per EMR, pt is uninsured  and a [employer] with plans to move here permanently. Pt lives with her dtr and her partner. Home is equipped to handle her care needs.   Team Discussion: Should be a short LOS. Has right posterior hip precautions. Continent bowel, incontinent bladder, has incision to right hip. Min assist with RW and walked 75 ft. Min assist with ADL's. Need to practice stairs. Patient on target to meet rehab goals: yes, supervision goals.  *See Care Plan and progress notes for long and short-term goals.   Revisions to Treatment Plan:  Not at this time.  Teaching Needs: Family education, medication management, pain management, skin/wound care, transfer training, gait training, balance training, endurance training, stair training, safety awareness.  Current Barriers to Discharge: Decreased caregiver support, Medical stability, Home enviroment access/layout, Incontinence, Wound care, Lack of/limited family support, Weight bearing restrictions, Medication compliance, and Behavior  Possible Resolutions to Barriers: Continue current medications, provide emotional support.     Medical Summary Current Status: admitted for right FNF and hip hemi. pain related issues. wound care, bp control.  Barriers to Discharge: Medical stability   Possible Resolutions to [employer] Focus: daily assessment of labs and patient data, pain control. wound care   Continued Need for Acute Rehabilitation Level of Care: The patient requires daily medical management by a physician with specialized training in physical medicine and rehabilitation for the  following reasons: Direction of a multidisciplinary physical rehabilitation program to maximize functional independence : Yes Medical management of patient stability for increased activity during participation in an intensive rehabilitation regime.: Yes Analysis of laboratory values and/or radiology reports with any subsequent need for medication adjustment and/or medical  intervention. : Yes   I attest that I was present, lead the team conference, and concur with the assessment and plan of the team.   Tennis Must 11/24/2020, 5:40 PM

## 2020-11-24 NOTE — Progress Notes (Signed)
"  I'm wide awake." Agreed to take med for sleep. PRN trazodone 25mg 's given at 2339. Using bedpan. Daughter requesting staff to use donut hole, reports sore tail bone after fall. Denies pain laying in bed. Educated patient to take pain 1 hour before therapy. Dressing in place to right hip. HRIR. 2340 A

## 2020-11-24 NOTE — Plan of Care (Signed)
  Problem: RH Balance Goal: LTG Patient will maintain dynamic standing balance (PT) Description: LTG:  Patient will maintain dynamic standing balance with assistance during mobility activities (PT) Flowsheets (Taken 11/24/2020 1745) LTG: Pt will maintain dynamic standing balance during mobility activities with:: Supervision/Verbal cueing   Problem: Sit to Stand Goal: LTG:  Patient will perform sit to stand with assistance level (PT) Description: LTG:  Patient will perform sit to stand with assistance level (PT) Flowsheets (Taken 11/24/2020 1745) LTG: PT will perform sit to stand in preparation for functional mobility with assistance level: Supervision/Verbal cueing   Problem: RH Bed Mobility Goal: LTG Patient will perform bed mobility with assist (PT) Description: LTG: Patient will perform bed mobility with assistance, with/without cues (PT). Flowsheets (Taken 11/24/2020 1745) LTG: Pt will perform bed mobility with assistance level of: Independent with assistive device    Problem: RH Bed to Chair Transfers Goal: LTG Patient will perform bed/chair transfers w/assist (PT) Description: LTG: Patient will perform bed to chair transfers with assistance (PT). Flowsheets (Taken 11/24/2020 1745) LTG: Pt will perform Bed to Chair Transfers with assistance level: Supervision/Verbal cueing   Problem: RH Car Transfers Goal: LTG Patient will perform car transfers with assist (PT) Description: LTG: Patient will perform car transfers with assistance (PT). Flowsheets (Taken 11/24/2020 1745) LTG: Pt will perform car transfers with assist:: Supervision/Verbal cueing   Problem: RH Ambulation Goal: LTG Patient will ambulate in controlled environment (PT) Description: LTG: Patient will ambulate in a controlled environment, # of feet with assistance (PT). Flowsheets (Taken 11/24/2020 1745) LTG: Pt will ambulate in controlled environ  assist needed:: Supervision/Verbal cueing LTG: Ambulation distance in controlled  environment: 150 ft with LRAD Goal: LTG Patient will ambulate in home environment (PT) Description: LTG: Patient will ambulate in home environment, # of feet with assistance (PT). Flowsheets (Taken 11/24/2020 1745) LTG: Pt will ambulate in home environ  assist needed:: Supervision/Verbal cueing LTG: Ambulation distance in home environment: 75 ft with LRAD   Problem: RH Stairs Goal: LTG Patient will ambulate up and down stairs w/assist (PT) Description: LTG: Patient will ambulate up and down # of stairs with assistance (PT) Flowsheets (Taken 11/24/2020 1745) LTG: Pt will ambulate up/down stairs assist needed:: Supervision/Verbal cueing LTG: Pt will  ambulate up and down number of stairs: 4 stairs with 2 handrails

## 2020-11-24 NOTE — Evaluation (Signed)
Occupational Therapy Assessment and Plan  Patient Details  Name: Angelica Ferguson MRN: [id number] Date of Birth: [birth year]  OT Diagnosis: abnormal posture, acute pain, cognitive deficits, muscle weakness (generalized), and R hip hemiarthroplasty Rehab Potential:   ELOS: 7-10 days   Today's Date: 11/24/2020 OT Individual Time: 0730-0850 OT Individual Time Calculation (min): 80 min     Hospital Problem: Principal Problem:   Closed fracture of right hip requiring operative repair, sequela Active Problems:   Pressure injury of skin   Past Medical History:  Past Medical History:  Diagnosis Date   Atrial fibrillation ([place])    Diabetes mellitus without complication ([place])    GERD (gastroesophageal reflux disease)    Hyperlipidemia    Hypertension    Past Surgical History:  Past Surgical History:  Procedure Laterality Date   CATARACT EXTRACTION Left    CORONARY/GRAFT ACUTE MI REVASCULARIZATION     HIP ARTHROPLASTY Right 11/19/2020   Procedure: ARTHROPLASTY BIPOLAR HIP (HEMIARTHROPLASTY);  Surgeon: Thornton Park, MD;  Location: [facility] ORS;  Service: Orthopedics;  Laterality: Right;   KNEE SURGERY Left    NO PAST SURGERIES      Assessment & Plan Clinical Impression: Angelica Ferguson is an 87 year old female with  history of T2DM, CAF- on endo HTN who was admitted on 11/16/20 to [facility] after fall 07/20 with progressive pain and difficulty walking. She was found to have right femoral neck fracture with angulation. Endoxaban placed on hold and she underwent right hip hemi on 07/28 by Dr. Nicola Police. Post op WBAT with hip precautions post op has had pain and lethargy affecting mobility and ADLs. She had rise in WBC to 21.7  and UC/CXR done which were negative for infection. CIR recommended due to functional decline. Patient transferred to CIR on 11/23/2020 .    Patient currently requires Min - mod with basic self-care skills secondary to muscle weakness, decreased cardiorespiratoy endurance,  decreased motor planning, decreased awareness, decreased problem solving, decreased safety awareness, and decreased memory, and decreased standing balance, decreased postural control, and decreased balance strategies.  Prior to hospitalization, patient could complete BADL with independent .  Patient will benefit from skilled intervention to decrease level of assist with basic self-care skills and increase independence with basic self-care skills prior to discharge home with care partner.  Anticipate patient will require 24 hour supervision and follow up home health.  OT - End of Session Activity Tolerance: Tolerates 30+ min activity with multiple rests Endurance Deficit: Yes Endurance Deficit Description: frequent rest breaks during functional activity OT Assessment OT Patient demonstrates impairments in the following area(s): Balance;Cognition;Endurance;Motor;Pain;Safety OT Basic ADL's Functional Problem(s): Grooming;Bathing;Dressing;Toileting OT Transfers Functional Problem(s): Toilet;Tub/Shower OT Additional Impairment(s): None OT Plan OT Intensity: Minimum of 1-2 x/day, 45 to 90 minutes OT Frequency: 5 out of 7 days OT Duration/Estimated Length of Stay: 7-10 days OT Treatment/Interventions: Balance/vestibular training;Discharge planning;Pain management;Self Care/advanced ADL retraining;Therapeutic Activities;UE/LE Coordination activities;Visual/perceptual remediation/compensation;Therapeutic Exercise;Skin care/wound managment;Patient/family education;Functional mobility training;Disease mangement/prevention;Cognitive remediation/compensation;Community reintegration;DME/adaptive equipment instruction;Neuromuscular re-education;Psychosocial support;UE/LE Strength taining/ROM;Wheelchair propulsion/positioning OT Self Feeding Anticipated Outcome(s): no goal OT Basic Self-Care Anticipated Outcome(s): Supervision OT Toileting Anticipated Outcome(s): Supervision OT Bathroom Transfers Anticipated  Outcome(s): Supervision OT Recommendation Recommendations for Other Services: Speech consult Patient destination: Home Follow Up Recommendations: Home health OT (HHOT vs none) Equipment Recommended: To be determined   OT Evaluation Precautions/Restrictions  Precautions Precautions: Posterior Hip;Fall Precaution Comments: pt able to recall 2/3 precautions Restrictions Weight Bearing Restrictions: Yes RLE Weight Bearing: Weight bearing as tolerated Pain Pain Assessment Pain Scale: 0-10  Pain Score: 0-No pain Home Living/Prior Functioning Home Living Family/patient expects to be discharged to:: Private residence Living Arrangements: Children Available Help at Discharge: Family, Available 24 hours/day Type of Home: House Home Access: Stairs to enter [employer] of Steps: 3 Entrance Stairs-Rails: Left, Right, Can reach both Home Layout: Two level, Able to live on main level with bedroom/bathroom Alternate Level Stairs-Number of Steps: has chair lift; 2nd floor set up as patient's living area with all needs accessible; dtr reports pt is able to stay on main floor of the home as needed with all needs accessible Bathroom Toilet: Handicapped height (w/ riser per chart) Additional Comments: shower chair electronically lowers pt into the tub and raises her up so pt can take baths with daughter's assist  Lives With: Daughter Prior Function Level of Independence: Independent with gait, Independent with transfers, Independent with basic ADLs, Independent with homemaking with ambulation  Able to Take Stairs?: Yes Driving: No Vision Baseline Vision/History: No visual deficits (no clear deficits at eval) Patient Visual Report: No change from baseline Perception  Perception: Within Functional Limits Praxis Praxis: Intact Cognition Overall Cognitive Status: Within Functional Limits for tasks assessed Arousal/Alertness: Awake/alert Orientation Level: Person;Situation Year:  Other (Comment) (pt knew it was "two thousand and something") Month: July Day of Week: Incorrect Memory: Impaired Memory Impairment: Decreased short term memory Immediate Memory Recall: Sock;Blue;Bed Memory Recall Sock: With Cue Memory Recall Blue: Without Cue Memory Recall Bed: Without Cue Attention: Focused;Sustained Focused Attention: Appears intact Sustained Attention: Appears intact Awareness: Appears intact Problem Solving: Appears intact (grossly for functional ADL task) Safety/Judgment: Appears intact Comments: daughter states pt does not keep up with dates, current events, seasons, etc and this is why unable to answer questions correctly. Note decreased short term memory throughout session but still functional for familiar ADL tasks. Sensation Sensation Light Touch: Appears Intact Proprioception: Appears Intact Coordination Gross Motor Movements are Fluid and Coordinated: No Fine Motor Movements are Fluid and Coordinated: Yes Coordination and Movement Description: impaired 2/2 pain and R hip surgery Motor  Motor Motor: Abnormal postural alignment and control Motor - Skilled Clinical Observations: impaired 2/2 pain and R hip surgery  Trunk/Postural Assessment  Cervical Assessment Cervical Assessment: Exceptions to [facility] Thoracic Assessment Thoracic Assessment: Exceptions to [facility] Lumbar Assessment Lumbar Assessment: Exceptions to [facility] Postural Control Postural Control: Within Functional Limits  Balance Balance Balance Assessed: Yes Static Sitting Balance Static Sitting - Balance Support: No upper extremity supported;Feet supported Static Sitting - Level of Assistance: 5: Stand by assistance Dynamic Sitting Balance Dynamic Sitting - Balance Support: No upper extremity supported;Feet supported;During functional activity Dynamic Sitting - Level of Assistance: 4: Min assist Dynamic Sitting - Balance Activities: Lateral lean/weight shifting;Forward lean/weight  shifting;Reaching for objects Static Standing Balance Static Standing - Balance Support: Bilateral upper extremity supported;During functional activity Static Standing - Level of Assistance: 5: Stand by assistance Dynamic Standing Balance Dynamic Standing - Balance Support: Bilateral upper extremity supported;During functional activity Dynamic Standing - Level of Assistance: 4: Min assist Dynamic Standing - Balance Activities: Lateral lean/weight shifting;Forward lean/weight shifting;Reaching for objects Extremity/Trunk Assessment RUE Assessment RUE Assessment: Within Functional Limits General Strength Comments: WFL during ADL, generalized weakness present from hospitalization LUE Assessment LUE Assessment: Within Functional Limits General Strength Comments: WFL during ADL, generalized weakness present from hospitalization  Care Tool Care Tool Self Care Eating    Set up    Oral Care     Set up    Bathing   Body parts bathed by patient: Right  arm;Left arm;Chest;Abdomen;Front perineal area;Buttocks;Right upper leg;Left upper leg;Face;Left lower leg Body parts bathed by helper: Right lower leg   Assist Level: Minimal Assistance - Patient > 75%    Upper Body Dressing(including orthotics)   What is the patient wearing?: Pull over shirt   Assist Level: Supervision/Verbal cueing    Lower Body Dressing (excluding footwear)   What is the patient wearing?: Incontinence brief;Pants Assist for lower body dressing: Moderate Assistance - Patient 50 - 74%    Putting on/Taking off footwear   What is the patient wearing?: Non-skid slipper socks;Ted hose Assist for footwear: Maximal Assistance - Patient 25 - 49%       Care Tool Toileting Toileting activity   Assist for toileting: Minimal Assistance - Patient > 75%     Care Tool Bed Mobility Roll left and right activity    Supervision    Sit to lying activity    CGA    Lying to sitting edge of bed activity   Lying to sitting  edge of bed assist level: Contact Guard/Touching assist     Care Tool Transfers Sit to stand transfer   Sit to stand assist level: Contact Guard/Touching assist    Chair/bed transfer   Chair/bed transfer assist level: Minimal Assistance - Patient > 75%     Toilet transfer   Assist Level: Minimal Assistance - Patient > 75%     Care Tool Cognition Expression of Ideas and Wants Expression of Ideas and Wants: Some difficulty - exhibits some difficulty with expressing needs and ideas (e.g, some words or finishing thoughts) or speech is not clear   Understanding Verbal and Non-Verbal Content Understanding Verbal and Non-Verbal Content: Usually understands - understands most conversations, but misses some part/intent of message. Requires cues at times to understand   Memory/Recall Ability *first 3 days only Memory/Recall Ability *first 3 days only: Current season;That he or she is in a hospital/hospital unit;Staff names and faces    Refer to Care Plan for [place] 1 OT Short Term Goal 1 (Week 1): STGs = LTGs d/t ELOS at Supervision  Recommendations for other services: Other: SLP for potential cognitive deficits    Skilled Therapeutic Intervention ADL ADL Eating: Not assessed Grooming: Setup Upper Body Bathing: Supervision/safety Lower Body Bathing: Minimal assistance Where Assessed-Lower Body Bathing: Standing at sink;Sitting at sink Upper Body Dressing: Supervision/safety Where Assessed-Upper Body Dressing: Sitting at sink Lower Body Dressing: Moderate assistance Where Assessed-Lower Body Dressing: Standing at sink;Sitting at sink Toileting: Minimal assistance Mobility  Bed Mobility Bed Mobility: Rolling Right;Rolling Left;Supine to Sit;Sit to Supine Rolling Right: Supervision/verbal cueing Rolling Left: Supervision/Verbal cueing Supine to Sit: Contact Guard/Touching assist Sit to Supine: Contact Guard/Touching assist Transfers Sit to Stand:  Minimal Assistance - Patient > 75%  Skilled Interventions: Pt greeted at time of session with daughter present who remained throughout eval/session and RN finishing up med pass. OT retrieved 18x18 manual wheelchair with R ELR at beginning of session. Discussion with pt and daughter regarding CIR structure as they were concerned about 3 hour/therapy, purpose and plan of OT, and goals, both agreeable.  Focus of session on ADL skill performance with pt performing transfers to/from bed > wheelchair with Min/CGA and RW. Performed sink level bathing with Min A only for RLE within hip precautions, able to static stand and wash buttocks. Pt recalled 2/3 hip precautions consistently. UB dress Supervision, LB dress for brief/pants Mod A but would be Min A for pants only. Total A  for time and hip precautions for TEDS and gripper socks. Pt transported around unit as well to see gym spaces for pt comfort and daughters as well. Pt/daughter having numerous questions, most answered and recommended f/u with nursing for medical questions. Pt also static standing for approx 5-7 minutes with times of BUE support and times of no UE support for PA/MD to be present to inspect buttocks wound and replace Mepilex. Pt up in chair alarm on call bell in reach and hand off to MD making rounds.    Discharge Criteria: Patient will be discharged from OT if patient refuses treatment 3 consecutive times without medical reason, if treatment goals not met, if there is a change in medical status, if patient makes no progress towards goals or if patient is discharged from hospital.  The above assessment, treatment plan, treatment alternatives and goals were discussed and mutually agreed upon: by patient and by family  Viona Gilmore 11/24/2020, 2:12 PM

## 2020-11-24 NOTE — Evaluation (Signed)
Physical Therapy Assessment and Plan  Patient Details  Name: Angelica Ferguson MRN: [id number] Date of Birth: 12-15-1932  PT Diagnosis: Abnormality of gait, Difficulty walking, Muscle weakness, and Pain in joint Rehab Potential: Excellent ELOS: 7-10 days   Today's Date: 11/24/2020 PT Individual Time: 1000-1100 PT Individual Time Calculation (min): 60 min    Hospital Problem: Principal Problem:   Closed fracture of right hip requiring operative repair, sequela Active Problems:   Pressure injury of skin   Past Medical History:  Past Medical History:  Diagnosis Date   Atrial fibrillation ([place])    Diabetes mellitus without complication ([place])    GERD (gastroesophageal reflux disease)    Hyperlipidemia    Hypertension    Past Surgical History:  Past Surgical History:  Procedure Laterality Date   CATARACT EXTRACTION Left    CORONARY/GRAFT ACUTE MI REVASCULARIZATION     HIP ARTHROPLASTY Right 11/19/2020   Procedure: ARTHROPLASTY BIPOLAR HIP (HEMIARTHROPLASTY);  Surgeon: Thornton Park, MD;  Location: [facility] ORS;  Service: Orthopedics;  Laterality: Right;   KNEE SURGERY Left    NO PAST SURGERIES      Assessment & Plan Clinical Impression:  Angelica Ferguson is an 87 year old female with  history of T2DM, CAF- on endo HTN who was admitted on 11/16/20 to [facility] after fall 07/20 with progressive pain and difficulty walking. She was found to have right femoral neck fracture with angulation. Endoxaban placed on hold and she underwent right hip hemi on 07/28 by Dr. Nicola Police. Post op WBAT with hip precautions post op has had pain and lethargy affecting mobility and ADLs. She had rise in WBC to 21.7  and UC/CXR done which were negative for infection. CIR recommended due to functional decline. Patient transferred to CIR on 11/23/2020 .   Patient currently requires min with mobility secondary to muscle weakness, decreased cardiorespiratoy endurance, and decreased standing balance, decreased postural  control, and decreased balance strategies.  Prior to hospitalization, patient was modified independent  with mobility and lived with Daughter in a House home.  Home access is 3Stairs to enter.  Patient will benefit from skilled PT intervention to maximize safe functional mobility, minimize fall risk, and decrease caregiver burden for planned discharge home with 24 hour assist.  Anticipate patient will benefit from follow up [facility] at discharge.  PT - End of Session Activity Tolerance: Tolerates 30+ min activity with multiple rests Endurance Deficit: Yes Endurance Deficit Description: frequent rest breaks during functional activity PT Assessment Rehab Potential (ACUTE/IP ONLY): Excellent PT Barriers to Discharge: Home environment access/layout PT Patient demonstrates impairments in the following area(s): Balance;Endurance;Pain;Safety PT Transfers Functional Problem(s): Bed Mobility;Bed to Chair;Car;Furniture;Floor PT Locomotion Functional Problem(s): Ambulation;Wheelchair Mobility;Stairs PT Plan PT Intensity: Minimum of 1-2 x/day ,45 to 90 minutes PT Frequency: 5 out of 7 days PT Duration Estimated Length of Stay: 7-10 days PT Treatment/Interventions: Ambulation/gait training;Balance/vestibular training;Community reintegration;Discharge planning;DME/adaptive equipment instruction;Functional mobility training;Pain management;Patient/family education;Stair training;Therapeutic Activities;Therapeutic Exercise;UE/LE Strength taining/ROM;UE/LE Coordination activities PT Transfers Anticipated Outcome(s): Supervision PT Locomotion Anticipated Outcome(s): Supervision with LRAD PT Recommendation Follow Up Recommendations: Home health PT Patient destination: Home Equipment Recommended: Rolling walker with 5" wheels Equipment Details: needs RW   PT Evaluation Precautions/Restrictions Precautions Precautions: Posterior Hip;Fall Precaution Comments: pt able to recall 2/3  precautions Restrictions Weight Bearing Restrictions: Yes RLE Weight Bearing: Weight bearing as tolerated Home Living/Prior Functioning Home Living Available Help at Discharge: Family;Available 24 hours/day Type of Home: House Home Access: Stairs to enter [employer] of Steps: 3 Entrance Stairs-Rails: Left;Right;Can reach  both Home Layout: Two level;Able to live on main level with bedroom/bathroom Alternate Level Stairs-Number of Steps: has chair lift; 2nd floor set up as patient's living area with all needs accessible; dtr reports pt is able to stay on main floor of the home as needed with all needs accessible Additional Comments: shower chair electronically lowers pt into the tub and raises her up so pt can take baths with daughter's assist  Lives With: Daughter Prior Function Level of Independence: Independent with gait;Independent with transfers  Able to Take Stairs?: Yes Driving: No Vision/Perception  Perception Perception: Within Functional Limits Praxis Praxis: Intact  Cognition Overall Cognitive Status: Within Functional Limits for tasks assessed Arousal/Alertness: Awake/alert Orientation Level: Oriented X4 Attention: Focused;Sustained Focused Attention: Appears intact Sustained Attention: Appears intact Memory: Appears intact Awareness: Appears intact Safety/Judgment: Appears intact Sensation Sensation Light Touch: Appears Intact Proprioception: Appears Intact Coordination Gross Motor Movements are Fluid and Coordinated: No Fine Motor Movements are Fluid and Coordinated: Yes Coordination and Movement Description: impaired 2/2 pain and R hip surgery Motor  Motor Motor: Abnormal postural alignment and control Motor - Skilled Clinical Observations: impaired 2/2 pain and R hip surgery  Trunk/Postural Assessment  Cervical Assessment Cervical Assessment: Exceptions to [facility] (forward head) Thoracic Assessment Thoracic Assessment: Exceptions to [facility] (rounded  shoulders) Lumbar Assessment Lumbar Assessment: Exceptions to [facility] (posterior pelvic tilt) Postural Control Postural Control: Within Functional Limits  Balance Balance Balance Assessed: Yes Static Sitting Balance Static Sitting - Balance Support: No upper extremity supported;Feet supported Static Sitting - Level of Assistance: 5: Stand by assistance Dynamic Sitting Balance Dynamic Sitting - Balance Support: No upper extremity supported;Feet supported;During functional activity Dynamic Sitting - Level of Assistance: 4: Min assist Static Standing Balance Static Standing - Balance Support: Bilateral upper extremity supported;During functional activity Static Standing - Level of Assistance: 5: Stand by assistance Dynamic Standing Balance Dynamic Standing - Balance Support: Bilateral upper extremity supported;During functional activity Dynamic Standing - Level of Assistance: 4: Min assist Extremity Assessment   RLE Assessment RLE Assessment: Exceptions to [facility] Active Range of Motion (AROM) Comments: decreased hip flexion due to pain General Strength Comments: see below RLE Strength Right Hip Flexion: 3-/5 Right Knee Flexion: 4/5 Right Knee Extension: 4/5 Right Ankle Dorsiflexion: 4/5 LLE Assessment LLE Assessment: Within Functional Limits General Strength Comments: 5/5 grossly   Care Tool Care Tool Bed Mobility Roll left and right activity   Roll left and right assist level: Supervision/Verbal cueing    Sit to lying activity   Sit to lying assist level: Contact Guard/Touching assist    Lying to sitting edge of bed activity   Lying to sitting edge of bed assist level: Contact Guard/Touching assist     Care Tool Transfers Sit to stand transfer   Sit to stand assist level: Minimal Assistance - Patient > 75%    Chair/bed transfer   Chair/bed transfer assist level: Minimal Assistance - Patient > 75%     Toilet transfer   Assist Level: Moderate Assistance - Patient 50 -  74%    Car transfer   Car transfer assist level: Minimal Assistance - Patient > 75%      Care Tool Locomotion Ambulation   Assist level: Minimal Assistance - Patient > 75% Assistive device: Walker-rolling Max distance: 75'  Walk 10 feet activity   Assist level: Minimal Assistance - Patient > 75% Assistive device: Walker-rolling   Walk 50 feet with 2 turns activity   Assist level: Minimal Assistance - Patient > 75% Assistive device:  Walker-rolling  Walk 150 feet activity Walk 150 feet activity did not occur: Safety/medical concerns      Walk 10 feet on uneven surfaces activity Walk 10 feet on uneven surfaces activity did not occur: Safety/medical concerns      Stairs   Assist level: Moderate Assistance - Patient - 50 - 74% Stairs assistive device: 2 hand rails Max number of stairs: 4  Walk up/down 1 step activity   Walk up/down 1 step (curb) assist level: Moderate Assistance - Patient - 50 - 74% Walk up/down 1 step or curb assistive device: 2 hand rails    Walk up/down 4 steps activity Walk up/down 4 steps assist level: Moderate Assistance - Patient - 50 - 74% Walk up/down 4 steps assistive device: 2 hand rails  Walk up/down 12 steps activity Walk up/down 12 steps activity did not occur: Safety/medical concerns      Pick up small objects from floor Pick up small object from the floor (from standing position) activity did not occur: Safety/medical concerns      Wheelchair Will patient use wheelchair at discharge?: No          Wheel 50 feet with 2 turns activity      Wheel 150 feet activity         Refer to Care Plan for [place] [facility] PT Short Term Goal 1 (Week 1): =LTG due to ELOS  Recommendations for other services: None   Skilled Therapeutic Intervention Evaluation completed (see details above and below) with education on PT POC and goals and individual treatment initiated with focus on functional transfer and gait assessment and  setting patient up with appropriate equipment to be utilized during rehab stay. Pt received seated in w/c in room, daughter present. No complaints of pain. Sit to stand with min A to RW throughout session. Provided patient with air cushion for w/c for improved skin integrity and pressure relief. Ambulation 2 x 75 ft with RW and min A for balance, antalgic gait pattern with decreased stance on RLE, decreased step length with LLE. Car transfer with min A with cues to adhere to hip precautions during transfer, use of RW. Pt on room air throughout session, SpO2 at 95% at rest and with activity. Provided patient with incentive spirometer to work on when not in therapy. Pt left seated in recliner in room with needs in reach, quick release belt and chair alarm in place, daughter present.  Mobility Bed Mobility Bed Mobility: Rolling Right;Rolling Left;Supine to Sit;Sit to Supine Rolling Right: Supervision/verbal cueing Rolling Left: Supervision/Verbal cueing Supine to Sit: Contact Guard/Touching assist Sit to Supine: Contact Guard/Touching assist Transfers Transfers: Sit to Stand;Stand Pivot Transfers Sit to Stand: Minimal Assistance - Patient > 75% Stand Pivot Transfers: Minimal Assistance - Patient > 75% Stand Pivot Transfer Details: Verbal cues for technique;Verbal cues for precautions/safety;Verbal cues for safe use of DME/AE Transfer (Assistive device): Rolling walker Locomotion  Gait Gait Distance (Feet): 75 Feet Assistive device: Rolling walker Gait Gait Pattern: Impaired (antalgic, step to, dec stance on RLE, dec step length LLE) Gait velocity: decreased Stairs / Additional Locomotion Stairs: No Wheelchair Mobility Wheelchair Mobility: No   Discharge Criteria: Patient will be discharged from PT if patient refuses treatment 3 consecutive times without medical reason, if treatment goals not met, if there is a change in medical status, if patient makes no progress towards goals or if patient  is discharged from hospital.  The above assessment, treatment plan, treatment  alternatives and goals were discussed and mutually agreed upon: by patient and by family   Excell Seltzer, PT, DPT, CSRS 11/24/2020, 11:28 AM

## 2020-11-24 NOTE — Progress Notes (Signed)
Occupational Therapy Session Note  Patient Details  Name: Angelica Ferguson MRN: [id number] Date of Birth: 27-Jul-1932  Today's Date: 11/24/2020 OT Individual Time: 1130-1156 OT Individual Time Calculation (min): 26 min    Short Term Goals: Week 1:     Skilled Therapeutic Interventions/Progress Updates:    OT intervention initiated following consultation with evaluating OTR regarding POC. Pt resting in recliner with daughter present. Home measurement sheet provided. Discussed home setup and requested pt to take pictures of bathroom. AE education initiated. Pt recalled 3/3 hip precautions. Pt remained seated in recliner with all needs within reach and belt alarm activated. Daughter present.   Therapy Documentation Precautions:  Precautions Precautions: Posterior Hip, Fall Precaution Comments: pt able to recall 2/3 precautions Restrictions Weight Bearing Restrictions: Yes RLE Weight Bearing: Weight bearing as tolerated Pain:  Pt c/o cocyx discomfort; repositoined and RN aware   Therapy/Group: Individual Therapy  Rich Brave 11/24/2020, 11:57 AM

## 2020-11-24 NOTE — Plan of Care (Signed)
  Problem: RH Balance Goal: LTG Patient will maintain dynamic standing with ADLs (OT) Description: LTG:  Patient will maintain dynamic standing balance with assist during activities of daily living (OT)  Flowsheets (Taken 11/24/2020 1420) LTG: Pt will maintain dynamic standing balance during ADLs with: Supervision/Verbal cueing   Problem: RH Bathing Goal: LTG Patient will bathe all body parts with assist levels (OT) Description: LTG: Patient will bathe all body parts with assist levels (OT) Flowsheets (Taken 11/24/2020 1420) LTG: Pt will perform bathing with assistance level/cueing: Supervision/Verbal cueing   Problem: RH Dressing Goal: LTG Patient will perform upper body dressing (OT) Description: LTG Patient will perform upper body dressing with assist, with/without cues (OT). Flowsheets (Taken 11/24/2020 1420) LTG: Pt will perform upper body dressing with assistance level of: Set up assist Goal: LTG Patient will perform lower body dressing w/assist (OT) Description: LTG: Patient will perform lower body dressing with assist, with/without cues in positioning using equipment (OT) Flowsheets (Taken 11/24/2020 1420) LTG: Pt will perform lower body dressing with assistance level of: Supervision/Verbal cueing   Problem: RH Toileting Goal: LTG Patient will perform toileting task (3/3 steps) with assistance level (OT) Description: LTG: Patient will perform toileting task (3/3 steps) with assistance level (OT)  Flowsheets (Taken 11/24/2020 1420) LTG: Pt will perform toileting task (3/3 steps) with assistance level: Supervision/Verbal cueing   Problem: RH Toilet Transfers Goal: LTG Patient will perform toilet transfers w/assist (OT) Description: LTG: Patient will perform toilet transfers with assist, with/without cues using equipment (OT) Flowsheets (Taken 11/24/2020 1420) LTG: Pt will perform toilet transfers with assistance level of: Supervision/Verbal cueing   Problem: RH Tub/Shower Transfers Goal:  LTG Patient will perform tub/shower transfers w/assist (OT) Description: LTG: Patient will perform tub/shower transfers with assist, with/without cues using equipment (OT) Flowsheets (Taken 11/24/2020 1420) LTG: Pt will perform tub/shower stall transfers with assistance level of: Supervision/Verbal cueing

## 2020-11-24 NOTE — Progress Notes (Signed)
Inpatient Rehabilitation  Patient information reviewed and entered into eRehab system by Lulu Hirschmann M. Abbee Cremeens, M.A., CCC/SLP, PPS Coordinator.  Information including medical coding, functional ability and quality indicators will be reviewed and updated through discharge.    

## 2020-11-24 NOTE — Progress Notes (Signed)
Physical Therapy Session Note  Patient Details  Name: Angelica Ferguson MRN: [id number] Date of Birth: 1932/07/22  Today's Date: 11/24/2020 PT Individual Time: 1515-1600 PT Individual Time Calculation (min): 45 min   Short Term Goals: Week 1:  PT Short Term Goal 1 (Week 1): =LTG due to ELOS  Skilled Therapeutic Interventions/Progress Updates:    Pt received supine in bed napping, able to be aroused and agreeable to PT session. No complaints of pain. Bed mobility Supervision with use of bedrail and increased time needed for RLE management. Sit to stand with CGA to min A to RW throughout session. Ambulation x 213 ft with RW and CGA needed for balance, cues for step-through gait pattern. Pt continues to exhibit antalgic gait pattern with decreased tolerance for stance on RLE. Ascend/descend 4 x 6" stairs with 2 handrails and mod A for balance, cues for gait pattern. Pt reports feeling very fatigued following stair navigation. Toilet transfer with min A needed from low surface of toilet, use of grab bar and RW. Pt is setup A for clothing management and pericare. Assisted pt with changing into nightgown and assist needed to don new brief. Pt returned to bed at Supervision level with increased time needed for RLE management. Pt left seated in bed with needs in reach, bed alarm in place, daughter present.  Therapy Documentation Precautions:  Precautions Precautions: Posterior Hip, Fall Precaution Comments: pt able to recall 2/3 precautions Restrictions Weight Bearing Restrictions: Yes RLE Weight Bearing: Weight bearing as tolerated   Therapy/Group: Individual Therapy   Peter Congo, PT, DPT, CSRS  11/24/2020, 5:08 PM

## 2020-11-24 NOTE — H&P (Addendum)
Physical Medicine and Rehabilitation Admission H&P        Chief Complaint  Patient presents with   Hip fracture with functional deficits      HPI: Angelica Ferguson is an 87 year old female with  history of T2DM, CAF- on endo HTN who was [facility] on 11/16/20 [facility] after fall 07/20 with progressive pain and difficulty walking. She was found to have right femoral neck fracture with angulation. Endoxaban placed on hold and she underwent right hip hemi on 07/28 by Dr. Nyoka LintKrasinki. Post op WBAT with hip precautions post op has had pain and lethargy affecting mobility and ADLs. She had rise in WBC to 21.7  and UC/CXR done which were negative for infection. CIR recommended due to functional decline.     Review of Systems Constitutional:  Negative for chills and fever. HENT:  Positive for hearing loss. Negative for tinnitus.   Eyes:  Positive for blurred vision. Respiratory:  Negative for cough and shortness of breath.   Cardiovascular:  Negative for chest pain and palpitations. Gastrointestinal:  Negative for constipation, heartburn and nausea. Genitourinary:  Negative for dysuria and urgency. Musculoskeletal:  Positive for joint pain and myalgias.       Coccyx pain since the fall  Skin:  Negative for itching and rash. Neurological:  Positive for weakness. Negative for dizziness. Psychiatric/Behavioral:  The patient is not nervous/anxious and does not have insomnia.           Past Medical History:  Diagnosis Date   Atrial fibrillation (HCC)     Diabetes mellitus without complication (HCC)     GERD (gastroesophageal reflux disease)     Hyperlipidemia     Hypertension             Past Surgical History:  Procedure Laterality Date   CATARACT EXTRACTION Left     CORONARY/GRAFT ACUTE MI REVASCULARIZATION       HIP ARTHROPLASTY Right 11/19/2020    Procedure: ARTHROPLASTY BIPOLAR HIP (HEMIARTHROPLASTY);  Surgeon: Juanell FairlyKrasinski, Kevin, MD;  Location: [facility] ORS;  Service: Orthopedics;   Laterality: Right;   KNEE SURGERY Left     NO PAST SURGERIES               Family History  Problem Relation Age of Onset   Heart disease Sister     Cancer Sister        Social History: widowed. She reports that she has quit smoking. Her smoking use included cigarettes. She has never used smokeless tobacco. She reports previous alcohol use. She reports that she does not use drugs.     Allergies: No Known Allergies           Medications Prior to Admission  Medication Sig Dispense Refill   albuterol (VENTOLIN HFA) 108 (90 Base) MCG/ACT inhaler Inhale 1 puff into the lungs every 6 (six) hours as needed for wheezing or shortness of breath.       atorvastatin (LIPITOR) 80 MG tablet Take 80 mg by mouth every evening.       bisoprolol (ZEBETA) 5 MG tablet Take 1-2 mg by mouth See admin instructions. Take 7.5 mg in the morning, and 5 mg in the evening       diltiazem (CARDIZEM) 60 MG tablet Take 60 mg by mouth 2 (two) times daily.       edoxaban (SAVAYSA) 30 MG TABS tablet Take 30 mg by mouth daily.       Glycopyrrolate-Formoterol (BEVESPI AEROSPHERE) 9-4.8 MCG/ACT AERO  Inhale 2 puffs into the lungs 2 (two) times daily.       lansoprazole (PREVACID) 15 MG capsule Take 15 mg by mouth daily.       metFORMIN (GLUCOPHAGE) 500 MG tablet Take 500 mg by mouth every evening.       ramipril (ALTACE) 10 MG capsule Take 10 mg by mouth daily.          Drug Regimen Review  Drug regimen was reviewed and remains appropriate with no significant issues identified   Home: Home Living Family/patient expects to be discharged to:: Private residence Living Arrangements: Children (pt now lives with daughter and daughter's partner) Available Help at Discharge: Family, Available 24 hours/day Type of Home: House Home Access: Stairs to enter Secretary/administrator of Steps: 3 Entrance Stairs-Rails: Left, Right Home Layout: Two level, Able to live on main level with bedroom/bathroom Alternate Level  Stairs-Number of Steps: has chair lift; 2nd floor set up as patient's living area with all needs accessible; dtr reports pt is able to stay on main floor of the home as needed with all needs accessible Bathroom Toilet: Handicapped height (+ riser w/ rails) Home Equipment: Cane - single point, Toilet riser, Other (comment), Bedside commode, Shower seat Additional Comments: shower chair electronically lowers pt into the tub and raises her up so pt can take baths with daughter's assist  Lives With: Spouse   Functional History: Prior Function Level of Independence: Needs assistance Gait / Transfers Assistance Needed: pt was ambulating without AD or assist prior to fall, able to get up main steps in home and then took chair lift up to 2nd floor ADL's / Homemaking Assistance Needed: pt indep with basic ADL except had assist from dtr for tub transfers and bathing; family assist for transportation, medication mgt, meals, and cleaning Comments: No additional falls.   Functional Status:  Mobility: Bed Mobility Overal bed mobility: Needs Assistance Bed Mobility: Supine to Sit Supine to sit: Min assist Sit to supine: Mod assist General bed mobility comments: Great effort today, showed ability to slowly scoot toward EOB, help to ease R LE off EOB safely, only very light assist (and heavy UE use and effort from pt) to lift torso to EOB Transfers Overall transfer level: Needs assistance Equipment used: Rolling walker (2 wheeled), 1 person hand held assist Transfers: Sit to/from Stand Sit to Stand: Mod assist  Lateral/Scoot Transfers: Mod assist, From elevated surface General transfer comment: Pt showed better control in keeping weight/hips shifting forward, still needing assist to insure she did not sit back down Ambulation/Gait Ambulation/Gait assistance: Min assist Gait Distance (Feet): 50 Feet Assistive device: Rolling walker (2 wheeled) General Gait Details: Pt again showing great effort with  ambulation.  With cuing she was better able to slow and be more deliberate with her cadence and ability to keep R knee extended during WBing.  She still had some occasional buckling, but not nearly as severe of frequent as previous efforts.  Only very rare need for PT to assist physically, heavy VCs t/o the effort, daughter following with recliner.   ADL: ADL Overall ADL's : Needs assistance/impaired General ADL Comments: Pt currently requires MAX A for bed level toileting, MAX A for seated LB dressing involving lateral leans, and set up and PRN Min A for seated UB ADL. Set up and SBA for seated grooming tasks. Primarily pain limited with movement of RLE.   Cognition: Cognition Overall Cognitive Status: History of cognitive impairments - at baseline Orientation Level: Oriented X4 Cognition  Arousal/Alertness: Awake/alert Behavior During Therapy: WFL for tasks assessed/performed Overall Cognitive Status: History of cognitive impairments - at baseline General Comments: per DTR very minimal signs of cognitive decline.     Blood pressure 136/83, pulse 95, temperature 98.3 F (36.8 C), resp. rate 16, height 5\' 3"  (1.6 m), weight 40.8 kg, SpO2 99 %. Physical Exam Vitals and nursing note reviewed. Constitutional:      Appearance: Normal appearance. HEENT: NCAT, oral mucosa dry.  Cardiovascular:    Rate and Rhythm: Rhythm regularly irregular. Rate controlled Pulmonary:    Breath sounds: Wheezing (occasional wheeze) present. Non labored. Reasonable air flow Musculoskeletal:    right hip tender. Does a good job with weight shift to right.  Skin: right hip incision with scant serosanguinous drainage. 1cm are of breakdown on coccyx with surrounding skin sl reddened.  Neurological:    Mental Status: She is alert and oriented to person, place, and time. Good insight and awareness. Normal language. Functional memory.    Comments: HOH. Able to follow simple motor commands.     motor 5/5 UE. LLE 5/5  prox to distal. RLE 3-4/5 prox to distal. No sensory loss.    Lab Results Last 48 Hours        Results for orders placed or performed during the hospital encounter of 11/16/20 (from the past 48 hour(s))  Glucose, capillary     Status: Abnormal    Collection Time: 11/21/20  4:30 PM  Result Value Ref Range    Glucose-Capillary 141 (H) 70 - 99 mg/dL      Comment: Glucose reference range applies only to samples taken after fasting for at least 8 hours.  Glucose, capillary     Status: Abnormal    Collection Time: 11/21/20  9:28 PM  Result Value Ref Range    Glucose-Capillary 142 (H) 70 - 99 mg/dL      Comment: Glucose reference range applies only to samples taken after fasting for at least 8 hours.    Comment 1 Notify RN    CBC     Status: Abnormal    Collection Time: 11/22/20  6:36 AM  Result Value Ref Range    WBC 17.1 (H) 4.0 - 10.5 K/uL    RBC 3.29 (L) 3.87 - 5.11 MIL/uL    Hemoglobin 9.1 (L) 12.0 - 15.0 g/dL    HCT 11/24/20 (L) 28.7 - 46.0 %    MCV 88.1 80.0 - 100.0 fL    MCH 27.7 26.0 - 34.0 pg    MCHC 31.4 30.0 - 36.0 g/dL    RDW 86.7 (H) 67.2 - 15.5 %    Platelets 360 150 - 400 K/uL    nRBC 0.0 0.0 - 0.2 %      Comment: Performed at [facility], [address] [place], [place]  Glucose, capillary     Status: Abnormal    Collection Time: 11/22/20  7:42 AM  Result Value Ref Range    Glucose-Capillary 105 (H) 70 - 99 mg/dL      Comment: Glucose reference range applies only to samples taken after fasting for at least 8 hours.  Glucose, capillary     Status: Abnormal    Collection Time: 11/22/20 11:31 AM  Result Value Ref Range    Glucose-Capillary 183 (H) 70 - 99 mg/dL      Comment: Glucose reference range applies only to samples taken after fasting for at least 8 hours.  Glucose, capillary     Status: Abnormal  Collection Time: 11/22/20  4:34 PM  Result Value Ref Range    Glucose-Capillary 151 (H) 70 - 99 mg/dL      Comment: Glucose reference range  applies only to samples taken after fasting for at least 8 hours.  Glucose, capillary     Status: Abnormal    Collection Time: 11/22/20 10:01 PM  Result Value Ref Range    Glucose-Capillary 127 (H) 70 - 99 mg/dL      Comment: Glucose reference range applies only to samples taken after fasting for at least 8 hours.    Comment 1 Notify RN      Comment 2 Document in Chart    Glucose, capillary     Status: Abnormal    Collection Time: 11/23/20  7:26 AM  Result Value Ref Range    Glucose-Capillary 128 (H) 70 - 99 mg/dL      Comment: Glucose reference range applies only to samples taken after fasting for at least 8 hours.  CBC with Differential/Platelet     Status: Abnormal    Collection Time: 11/23/20  7:48 AM  Result Value Ref Range    WBC 15.7 (H) 4.0 - 10.5 K/uL    RBC 3.56 (L) 3.87 - 5.11 MIL/uL    Hemoglobin 9.8 (L) 12.0 - 15.0 g/dL    HCT 29.5 (L) 62.1 - 46.0 %    MCV 88.5 80.0 - 100.0 fL    MCH 27.5 26.0 - 34.0 pg    MCHC 31.1 30.0 - 36.0 g/dL    RDW 30.8 (H) 65.7 - 15.5 %    Platelets 502 (H) 150 - 400 K/uL    nRBC 0.0 0.0 - 0.2 %    Neutrophils Relative % 74 %    Neutro Abs 11.7 (H) 1.7 - 7.7 K/uL    Lymphocytes Relative 10 %    Lymphs Abs 1.6 0.7 - 4.0 K/uL    Monocytes Relative 11 %    Monocytes Absolute 1.7 (H) 0.1 - 1.0 K/uL    Eosinophils Relative 3 %    Eosinophils Absolute 0.5 0.0 - 0.5 K/uL    Basophils Relative 1 %    Basophils Absolute 0.1 0.0 - 0.1 K/uL    Immature Granulocytes 1 %    Abs Immature Granulocytes 0.17 (H) 0.00 - 0.07 K/uL      Comment: Performed at [facility], [address] [place], [place] [address]  Basic metabolic panel     Status: Abnormal    Collection Time: 11/23/20  7:48 AM  Result Value Ref Range    Sodium 135 135 - 145 mmol/L    Potassium 3.5 3.5 - 5.1 mmol/L    Chloride 97 (L) 98 - 111 mmol/L    CO2 30 22 - 32 mmol/L    Glucose, Bld 124 (H) 70 - 99 mg/dL      Comment: Glucose reference range applies only to samples  taken after fasting for at least 8 hours.    BUN 30 (H) 8 - 23 mg/dL    Creatinine, Ser 2.95 0.44 - 1.00 mg/dL    Calcium 8.7 (L) 8.9 - 10.3 mg/dL    GFR, Estimated >28 >41 mL/min      Comment: (NOTE) Calculated using the CKD-EPI Creatinine Equation (2021)      Anion gap 8 5 - 15      Comment: Performed at [facility], [address] [place], [place] [address]  Glucose, capillary     Status: Abnormal  Collection Time: 11/23/20 11:28 AM  Result Value Ref Range    Glucose-Capillary 128 (H) 70 - 99 mg/dL      Comment: Glucose reference range applies only to samples taken after fasting for at least 8 hours.      Imaging Results (Last 48 hours)  No results found.           Medical Problem List and Plan: 1.  Functional and mobility deficits secondary to right femoral neck fx after fall 7/20. Right hip hemi performed 7/28  -WBAT, posterior hip precautions             -patient may shower             -ELOS/Goals: 7-10 days, supervision with PT/OT 2.  Antithrombotics: -DVT/anticoagulation:  Pharmaceutical: Other (comment)--Savayas daily             -antiplatelet therapy: N/A 3. Pain Management: Hydrocodone prn.  4. Mood: LCSW to follow for evaluation and support.              -antipsychotic agents: N/A 5. Neuropsych: This patient is capable of making decisions on her own behalf. 6. Skin/Wound Care: Routine pressure relief measures. Daily dressing changes to right hip -pressure relief, foam dressing to coccyx   -stressed importance of nutrition 7. Fluids/Electrolytes/Nutrition: encourage fluids  I personally reviewed the patient's labs today.   8. A fib: Monitor HR TID--continue endoxaban daily --bisoprolol and Cardizem bid. 9. HTN: Monitor BP tid--watch for orthostatic changes. 10.  T2DM: Hgb A1C-6.2--was on metformin PTA. --will monitor BS ac/hs and use SSI --Will check BMET in am prior to resuming metformin  11. Leucocytosis: Trending down 14.4, monitor for fevers  and other signs of infection.  12. ABLA: hgb holding at 9.4 -stable  13. Acute renal failure: Encourage fluid intake-   -BUN/CR improved to 23/0.95  -push fluids, recheck labs Thursday         Jacquelynn Cree, [place] 11/23/2020

## 2020-11-24 NOTE — Plan of Care (Signed)
  Problem: RH BOWEL ELIMINATION Goal: RH STG MANAGE BOWEL WITH ASSISTANCE Description: STG Manage Bowel with Supervision Assistance. Flowsheets (Taken 11/24/2020 0252) STG: Pt will manage bowels with assistance: 4-Minimum assistance   Problem: RH BLADDER ELIMINATION Goal: RH STG MANAGE BLADDER WITH ASSISTANCE Description: STG Manage Bladder With Supervision Assistance Flowsheets (Taken 11/24/2020 0252) STG: Pt will manage bladder with assistance: 4-Minimal assistance   Problem: RH SKIN INTEGRITY Goal: RH STG MAINTAIN SKIN INTEGRITY WITH ASSISTANCE Description: STG Maintain Skin Integrity With Supervision Assistance. Flowsheets (Taken 11/24/2020 0252) STG: Maintain skin integrity with assistance: 4-Minimal assistance Goal: RH STG ABLE TO PERFORM INCISION/WOUND CARE W/ASSISTANCE Description: STG Able To Perform Incision/Wound Care With Supervision Assistance. Flowsheets (Taken 11/24/2020 0252) STG: Pt will be able to perform incision/wound care with assistance: 3-Moderate assistance   Problem: RH SAFETY Goal: RH STG DECREASED RISK OF FALL WITH ASSISTANCE Description: STG Decreased Risk of Fall With Supervision Assistance. Flowsheets (Taken 11/24/2020 0252) TXM:[id number] risk of fall  with assistance/device: 4-Minimal assistance

## 2020-11-24 NOTE — Progress Notes (Signed)
Patient ID: Angelica Ferguson, female   DOB: [birth year], 87 y.o.   MRN: [id number]  SW met with pt and pt dtr [name] in room to introduce self, explain role, discuss discharge process, and provide updates from team conference and d/c date 8/11. Pt dtr confirms pt will have all her care needs met. SW discussed Medicaid application and possible options. SW to f/u with [facility] per [facility] acct# trying to make contact with pt dtr. SW will f/u once there is more information.  *assessment completed.   Loralee Pacas, MSW, [place] Office: [phone] Cell: [phone] Fax: [phone]

## 2020-11-25 LAB — URINE CULTURE: Culture: 20000 — AB

## 2020-11-25 LAB — GLUCOSE, CAPILLARY
Glucose-Capillary: 137 mg/dL — ABNORMAL HIGH (ref 70–99)
Glucose-Capillary: 154 mg/dL — ABNORMAL HIGH (ref 70–99)
Glucose-Capillary: 157 mg/dL — ABNORMAL HIGH (ref 70–99)
Glucose-Capillary: 107 mg/dL — ABNORMAL HIGH (ref 70–99)

## 2020-11-25 NOTE — Progress Notes (Signed)
Occupational Therapy Session Note  Patient Details  Name: Aliyha Fornes MRN: [id number] Date of Birth: [birth year]  Today's Date: 11/25/2020 OT Individual Time: 4166-0630 OT Individual Time Calculation (min): 75 min    Short Term Goals: Week 1:  OT Short Term Goal 1 (Week 1): STGs = LTGs d/t ELOS at Supervision  Skilled Therapeutic Interventions/Progress Updates:    Pt resting in bed upon arrival with daughter present. Pt's daughter reviewed home measurement sheet that she completed. Following discussion about bed height (26"), daughter stated she will be purchasing a new bed. Daughter stated pt was having difficulty getting in bed before accident. Reviewed bathroom setup. Pt has all necessary equipment. Pt completed bathing/dressing with sit<>stand at sink. Pt used AE after demonstration to complete LB dressing tasks. Sit<>stand with supervision. Pt requested use of bathroom and amb with RW to toilet with CGA. Toileting with CGA. Pt returned to recliner. Pt remained in recliner with all needs within reach and belt alarm activated. Daughter present.   Therapy Documentation Precautions:  Precautions Precautions: Posterior Hip, Fall Precaution Comments: pt able to recall 2/3 precautions Restrictions Weight Bearing Restrictions: Yes RLE Weight Bearing: Weight bearing as tolerated Pain: Pt denies pain this morning  Therapy/Group: Individual Therapy  Rich Brave 11/25/2020, 10:59 AM

## 2020-11-25 NOTE — Progress Notes (Signed)
Physical Therapy Session Note  Patient Details  Name: Angelica Ferguson MRN: [id number] Date of Birth: [birth year]  Today's Date: 11/25/2020 PT Individual Time: 1000-1100; 1315-1405 PT Individual Time Calculation (min): 60 min and 50 min PT Missed Time: 10 min Missed Time Reason: patient fatigue  Short Term Goals: Week 1:  PT Short Term Goal 1 (Week 1): =LTG due to ELOS  Skilled Therapeutic Interventions/Progress Updates:    Session 1: Pt received seated in recliner in room, agreeable to PT session. Pt reports abdominal pain and frequent gas and loose stools this AM. Nursing aware. Sit to stand with min A from lower height surface of recliner chair (19.5") to RW. Stand pivot transfer with RW and CGA. Ambulation x 100 ft with RW and CGA for balance, improved gait pattern from previous session but ongoing antalgic gait and decreased step length with LLE. Nustep level 4 x 10 min with use of B UE/LE for global endurance training. Pt reports Nustep was easy initially but during last two minutes became more difficult. Sit to stand x 10 reps from mat table with CGA and no AD for LE strengthening. Standing alt L/R 6" step-taps with RW and min A for balance, 2 x 10 reps for LE strengthening, coordination, and standing balance. Ambulation x 75 ft with RW and CGA, improvement in step-through gait pattern and smoothness of gait noted. Pt returned to bed at end of session, Supervision for bed mobility. Pt left seated in bed with needs in reach, bed alarm in place, daughter present.  Session 2: Pt received seated in recliner in room, agreeable to PT session. Pt reports onset of abdominal pain again and is requesting to use the bathroom. Sit to stand with CGA to RW by pushing up with BUE from arms of w/c. Ambulatory transfer into bathroom with RW and CGA. Toilet transfer with CGA and RW from elevated BSC over toilet. Pt is setup A for brief management, otherwise independent for clothing management and pericare. Pt  only able to void urine while seated on toilet, no BM. Ascend/descend 4 x 6" stairs with R handrail laterally with mod A to simulate home environment where rails are too far apart to reach both at the same time. Pt exhibits difficulty navigating stairs laterally but per daughter's report this is how she manuevered stairs at home prior to admission and she did have some difficulty with them. Attempt to navigate stairs with L handrail laterally, pt unable to ascend stairs with RLE first. Standing alt L/R 3" step-ups with BUE support and min A for balance, 2 x 10 reps. Ambulation x 100 ft with RW and CGA for balance. Pt left seated in recliner in room with needs in reach, quick release belt and chair alarm in place, daughter present at end of session. Pt missed 10 min of scheduled therapy session due to fatigue.  Therapy Documentation Precautions:  Precautions Precautions: Posterior Hip, Fall Precaution Comments: pt able to recall 2/3 precautions Restrictions Weight Bearing Restrictions: Yes RLE Weight Bearing: Weight bearing as tolerated     Therapy/Group: Individual Therapy   Peter Congo, PT, DPT, CSRS  11/25/2020, 5:25 PM

## 2020-11-26 DIAGNOSIS — D62 Acute posthemorrhagic anemia: Secondary | ICD-10-CM | POA: Diagnosis not present

## 2020-11-26 DIAGNOSIS — I482 Chronic atrial fibrillation, unspecified: Secondary | ICD-10-CM | POA: Diagnosis not present

## 2020-11-26 DIAGNOSIS — S72001S Fracture of unspecified part of neck of right femur, sequela: Secondary | ICD-10-CM | POA: Diagnosis not present

## 2020-11-26 DIAGNOSIS — E119 Type 2 diabetes mellitus without complications: Secondary | ICD-10-CM

## 2020-11-26 LAB — GLUCOSE, CAPILLARY
Glucose-Capillary: 115 mg/dL — ABNORMAL HIGH (ref 70–99)
Glucose-Capillary: 136 mg/dL — ABNORMAL HIGH (ref 70–99)
Glucose-Capillary: 120 mg/dL — ABNORMAL HIGH (ref 70–99)
Glucose-Capillary: 153 mg/dL — ABNORMAL HIGH (ref 70–99)

## 2020-11-26 LAB — CULTURE, BLOOD (ROUTINE X 2)
Culture: NO GROWTH
Culture: NO GROWTH
Special Requests: ADEQUATE
Special Requests: ADEQUATE

## 2020-11-26 MED ORDER — ALBUTEROL SULFATE (2.5 MG/3ML) 0.083% IN NEBU: 3.0000 mL | INHALATION_SOLUTION | Freq: Four times a day (QID) | RESPIRATORY_TRACT | Status: DC | PRN

## 2020-11-26 NOTE — Progress Notes (Signed)
Occupational Therapy Session Note  Patient Details  Name: Ayari Liwanag MRN: [id number] Date of Birth: 1932-07-17  Today's Date: 11/26/2020 OT Individual Time: 7943-2761 OT Individual Time Calculation (min): 55 min    Short Term Goals: Week 1:  OT Short Term Goal 1 (Week 1): STGs = LTGs d/t ELOS at Supervision  Skilled Therapeutic Interventions/Progress Updates:    Pt resting in recliner upon arrival with daughter present. OT intervention with focus on BADL retraining, sit<>stand, standing balance, funcitonal amb with RW, AE education/training, education, activity tolerance, and safety awareness to increase independence with bADLs. Pt requires assistance with bathing RLE without AE, donning Ted hose and donning Rt shoe. Sit<>stand with supeervision. Functional amb with RW-CGA. Min verbal cues for use of reacher for LB dressing tasks. Pt's daughter assists with donning Ted hose. Pt amb 175'X2 with RW-CGA. Pt returned to room and remained seated in recliner with belt alarm actiated. All needs wihtin reach and daughter present.   Therapy Documentation Precautions:  Precautions Precautions: Posterior Hip, Fall Precaution Comments: pt able to recall 2/3 precautions Restrictions Weight Bearing Restrictions: Yes RLE Weight Bearing: Weight bearing as tolerated Pain: Pain Assessment Pain Score: 0-No pain   Therapy/Group: Individual Therapy  Rich Brave 11/26/2020, 9:12 AM

## 2020-11-26 NOTE — Progress Notes (Signed)
Patient ID: Angelica Ferguson, female   DOB: 04/22/1933, 87 y.o.   MRN: [id number]  SW received updates from [facility] that a Medicaid application will be completed for emergency services only since patient is not a [place] citizen. SW provided updates to pt dtr [name] [phone]).  Cecile Sheerer, MSW, LCSWA Office: [phone] Cell: [phone] Fax: [phone]

## 2020-11-26 NOTE — Progress Notes (Signed)
PROGRESS NOTE   Subjective/Complaints: C/o soreness in right leg yesterday. Did a lot of walking and worked on stairs. Became a little sob and wheezy while working on stairs as well. Daughter asked if she could have her inhaler to use if needed with therapy  ROS: Patient denies fever, rash, sore throat, blurred vision, nausea, vomiting, diarrhea, cough,   chest pain,  back pain, headache, or mood change.    Objective:   No results found. Recent Labs    11/24/20 0518  WBC 14.4*  HGB 9.4*  HCT 29.4*  PLT 477*   Recent Labs    11/24/20 0518  NA 134*  K 3.9  CL 98  CO2 30  GLUCOSE 140*  BUN 23  CREATININE 0.95  CALCIUM 8.8*    Intake/Output Summary (Last 24 hours) at 11/26/2020 1549 Last data filed at 11/26/2020 2297 Gross per 24 hour  Intake 780 ml  Output --  Net 780 ml     Pressure Injury 11/23/20 Coccyx Mid Stage 2 -  Partial thickness loss of dermis presenting as a shallow open injury with a red, pink wound bed without slough. almost resolved (Active)  11/23/20 1836  Location: Coccyx  Location Orientation: Mid  Staging: Stage 2 -  Partial thickness loss of dermis presenting as a shallow open injury with a red, pink wound bed without slough.  Wound Description (Comments): almost resolved  Present on Admission: Yes    Physical Exam: Vital Signs Blood pressure 113/62, pulse 90, temperature 98.1 F (36.7 C), temperature source Oral, resp. rate 16, height 5\' 3"  (1.6 m), weight 55.3 kg, SpO2 98 %.  General: Alert and oriented x 3, No apparent distress HEENT: Head is normocephalic, atraumatic, PERRLA, EOMI, sclera anicteric, oral mucosa pink and moist, dentition intact, ext ear canals clear,  Neck: Supple without JVD or lymphadenopathy Heart: Reg rate and rhythm. No murmurs rubs or gallops Chest: CTA bilaterally without wheezes, rales, or rhonchi; no distress Abdomen: Soft, non-tender, non-distended, bowel  sounds positive. Extremities: No clubbing, cyanosis, or edema. Pulses are 2+ Psych: Pt's affect is appropriate. Pt is cooperative Skin: right hip wound cdi. Sacrum with foam dressing Neuro:  Alert and oriented x 3. Normal insight and awareness. Intact Memory. Normal language and speech. Cranial nerve exam unremarkable. Motor and sensory without focal deficits other than pain inhibition. Normal reflexes Musculoskeletal: right hip tender to palpation. Able to tolerate wb. No calf swelling or tenderness    Assessment/Plan: 1. Functional deficits which require 3+ hours per day of interdisciplinary therapy in a comprehensive inpatient rehab setting. Physiatrist is providing close team supervision and 24 hour management of active medical problems listed below. Physiatrist and rehab team continue to assess barriers to discharge/monitor patient progress toward functional and medical goals  Care Tool:  Bathing    Body parts bathed by patient: Right arm, Left arm, Chest, Abdomen, Front perineal area, Buttocks, Right upper leg, Left upper leg, Left lower leg, Face   Body parts bathed by helper: Right lower leg     Bathing assist Assist Level: Minimal Assistance - Patient > 75%     Upper Body Dressing/Undressing Upper body dressing   What is the  patient wearing?: Pull over shirt    Upper body assist Assist Level: Set up assist    Lower Body Dressing/Undressing Lower body dressing      What is the patient wearing?: Incontinence brief, Pants     Lower body assist Assist for lower body dressing: Minimal Assistance - Patient > 75%     Toileting Toileting    Toileting assist Assist for toileting: Minimal Assistance - Patient > 75%     Transfers Chair/bed transfer  Transfers assist     Chair/bed transfer assist level: Minimal Assistance - Patient > 75%     Locomotion Ambulation   Ambulation assist      Assist level: Contact Guard/Touching assist Assistive device:  Walker-rolling Max distance: 100'   Walk 10 feet activity   Assist     Assist level: Contact Guard/Touching assist Assistive device: Walker-rolling   Walk 50 feet activity   Assist    Assist level: Contact Guard/Touching assist Assistive device: Walker-rolling    Walk 150 feet activity   Assist Walk 150 feet activity did not occur: Safety/medical concerns         Walk 10 feet on uneven surface  activity   Assist Walk 10 feet on uneven surfaces activity did not occur: Safety/medical concerns         Wheelchair     Assist Will patient use wheelchair at discharge?: No             Wheelchair 50 feet with 2 turns activity    Assist            Wheelchair 150 feet activity     Assist          Blood pressure 113/62, pulse 90, temperature 98.1 F (36.7 C), temperature source Oral, resp. rate 16, height 5\' 3"  (1.6 m), weight 55.3 kg, SpO2 98 %.  Medical Problem List and Plan: 1.  Functional and mobility deficits secondary to right femoral neck fx after fall 7/20. Right hip hemi performed 7/28             -WBAT, posterior hip precautions             -patient may shower             -ELOS/Goals: 7-10 days, supervision with PT/OT  -Continue CIR therapies including PT, OT  2.  Antithrombotics: -DVT/anticoagulation:  Pharmaceutical: Other (comment)--Savayas daily             -antiplatelet therapy: N/A 3. Pain Management: Hydrocodone prn.   -suspect mild muscle soreness from therapy yesterday---observe RLE 4. Mood: LCSW to follow for evaluation and support.              -antipsychotic agents: N/A 5. Neuropsych: This patient is capable of making decisions on her own behalf. 6. Skin/Wound Care: Routine pressure relief measures. Daily dressing changes to right hip -pressure relief, foam dressing to coccyx   -encourage PO 7. Fluids/Electrolytes/Nutrition: encourage fluids                 8. A fib: Monitor HR TID--continue endoxaban  daily --bisoprolol and Cardizem bid. 9. HTN: Monitor BP tid--watch for orthostatic changes. 10.  T2DM: Hgb A1C-6.2--was on metformin PTA. --will monitor BS ac/hs and use SSI --no need to resume metformin yet CBG (last 3)  Recent Labs    11/25/20 2116 11/26/20 0616 11/26/20 1201  GLUCAP 107* 115* 136*     11. Leucocytosis: Trending down 14.4, monitor for fevers and other signs of infection.  12. ABLA: hgb holding at 9.4 -stable  13. Acute renal failure: Encourage fluid intake-              -BUN/CR improved to 23/0.95             -push fluids, recheck Monday 14. COPD: scheduled qvar, proventil prn. May use with therapy for exercise induced symptoms    LOS: 3 days A FACE TO FACE EVALUATION WAS PERFORMED  Ranelle Oyster 11/26/2020, 3:49 PM

## 2020-11-26 NOTE — Progress Notes (Signed)
Inpatient Rehabilitation Care Coordinator Assessment and Plan Patient Details  Name: Angelica Ferguson MRN: [id number] Date of Birth: November 04, 1932  Today's Date: 11/26/2020  Hospital Problems: Principal Problem:   Closed fracture of right hip requiring operative repair, sequela Active Problems:   Pressure injury of skin  Past Medical History:  Past Medical History:  Diagnosis Date   Atrial fibrillation ([place])    Diabetes mellitus without complication ([place])    GERD (gastroesophageal reflux disease)    Hyperlipidemia    Hypertension    Past Surgical History:  Past Surgical History:  Procedure Laterality Date   CATARACT EXTRACTION Left    CORONARY/GRAFT ACUTE MI REVASCULARIZATION     HIP ARTHROPLASTY Right 11/19/2020   Procedure: ARTHROPLASTY BIPOLAR HIP (HEMIARTHROPLASTY);  Surgeon: Thornton Park, MD;  Location: [facility] ORS;  Service: Orthopedics;  Laterality: Right;   KNEE SURGERY Left    NO PAST SURGERIES     Social History:  reports that she has quit smoking. Her smoking use included cigarettes. She has never used smokeless tobacco. She reports previous alcohol use. She reports that she does not use drugs.  Family / Support Systems Marital Status: Widow/Widower Patient Roles: Parent Spouse/Significant Other: Widowed Children: 1 adult dtr Angelica Ferguson Other Supports: None reported Anticipated Caregiver: dtr Angelica Ferguson Ability/Limitations of Caregiver: Dtr is able to prove 24/7 care. Caregiver Availability: 24/7 Family Dynamics: Pt is currently staying with her dtr Angelica Ferguson in an apartment in the home.  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 Education: high school grad Read: Yes Write: Yes Employment Status: Retired Date Retired/Disabled/Unemployed: 2006 Public relations account executive Issues: Denies Guardian/Conservator: N/A   Abuse/Neglect Abuse/Neglect  Assessment Can Be Completed: Yes Physical Abuse: Denies Verbal Abuse: Denies Sexual Abuse: Denies Exploitation of patient/patient's resources: Denies Self-Neglect: Denies  Emotional Status Pt's affect, behavior and adjustment status: Pt in good spirits at time of visit. Recent Psychosocial Issues: Pt denies Psychiatric History: Denie Substance Abuse History: Pt quit smoking cigarettes over 30 years ago due to asthma. Denies any other uses.  Patient / Family Perceptions, Expectations & Goals Pt/Family understanding of illness & functional limitations: Pt and family have a general understanding of her care needs Premorbid pt/family roles/activities: Independent Anticipated changes in roles/activities/participation: Assistance with ADLs/IADLa Pt/family expectations/goals: Pt goal is to get R leg better and to go home." Patient's dtr goal is for her mother to hopefully be able to use stair lift.  Community Resources Express Scripts: None Premorbid Home Care/DME Agencies: None Transportation available at discharge: Dtr [place] referrals recommended: Neuropsychology  Discharge Planning Living Arrangements: Children Support Systems: Children Type of Residence: Private residence Insurance Resources: Teacher, adult education Resources: Family Support Financial Screen Referred: Yes (Eligible for emergency services only) Living Expenses: Lives with family Money Management: Family Does the patient have any problems obtaining your medications?: No Home Management: Independent Patient/Family Preliminary Plans: Assistance with ADLs/IADLs Care Coordinator Barriers to Discharge: Decreased caregiver support, Lack of/limited family support Care Coordinator Anticipated Follow Up Needs: HH/OP  Clinical Impression  SW met with pt and pt dtr Angelica Ferguson in room to introduce self, explain role, and discuss discharge process. 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 HCPOA dtr Angelica Ferguson. DME: cane, RW,  stair lift, bath lift, hand railings, bed rails on side and foot of bed.   Angelica Ferguson A Angelica Ferguson 11/26/2020, 3:00 PM

## 2020-11-26 NOTE — Progress Notes (Signed)
Physical Therapy Session Note  Patient Details  Name: Angelica Ferguson MRN: [id number] Date of Birth: [birth year]  Today's Date: 11/26/2020 PT Individual Time: 0920-0950 and 1405-1507 PT Individual Time Calculation (min): 30 min and 62 min  Short Term Goals: Week 1:  PT Short Term Goal 1 (Week 1): =LTG due to ELOS  Skilled Therapeutic Interventions/Progress Updates:    Session 1: Pt received sitting in recliner with her daughter present and pt agreeable to therapy session. Pt able to recall 3/3 R posterior hip precautions without cuing. Sit>stand recliner>RW with CGA for steadying. Gait training ~17ft using RW to day room with CGA for steadying - only slight/mild RLE antalgic gait pattern with reciprocal stepping and adequate gait speed (pt's daughter provides w/c follow in event of fatigue). Noted RW slightly too short for patient, provided different RW 1 notch taller to promote improved upright posture during gait. Standing balance and B LE strengthening via repeated R LE foot taps on/off 6" step (no difficulty noted in performing R hip flexion with this activity) progressed to alternate B LE foot taps all while using minimal B UE support on RW with CGA for steadying - pt demos no significant R LE weakness. Gait training ~123ft back towards room using RW with CGA for steadying and same gait pattern as noted above; however, pt with fatigue therefore requires w/c transport remainder of distance back. Stand pivot w/c>EOB using RW with CGA. Sit>supine CGA with pt using BUEs to assist with R LE management onto bed. Pt left supine in bed with needs in reach, HOB elevated, bed alarm on, and pt's daughter present.  Session 2: Pt received sitting EOB and agreeable to therapy session. Reports need to use bathroom. Sit>stand EOB>RW with CGA. Gait ~65ft into bathroom using RW with CGA. Standing using UE support on RW or grab bar as needed with CGA for steadying during LB clothing management without assist.  Continent of bowels - standing peri-care with set-up assist and CGA for balance steadying. Gait training ~46ft out of bathroom to sink with CGA and pt demoing safe AD management at a counter, hand hygiene without assist. Gait training ~145ft to main therapy gym using RW all without seated rest break and only 1x standing rest break to catch breath (pt's daughter provides w/c follow in event of fatigue) - pt still has slight/mild antalgic gait pattern favoring  RLE but otherwise has reciprocal stepping pattern and appropriate gait speed. Pt using "rescue inhaler" prior to stair navigation. Ascended/descended 4 steps (6" height) using B UE support on R HR only to replicate home via step-to pattern, requiring mod assist for lifting and balance - on ascent, when stepping up with L LE pt's R knee would buckle/give way requiring assist to stay upright - during descent when leading with R LE no buckling noted though therapist guarding for safety. Pt reports she feels weak in R LE, states only a little pain. Attempted standing with B UE support on R HR performing repeated L LE foot taps on/off 1st 6" step while therapist providing max facilitation to maintain R hip/knee extension but pt reports she does not feel confident and has fear of falling. Switched to 3" height stairs and performed same L LE foot taps on/off 1st step using B UE support on R HR only 2x5 reps with pt demonstrating significantly improved ability to maintain R hip/knee extension and increasing confidence with task - though continues to require mod manual facilitation to keep R hip/knee extended (prevent buckle) and continues  to have hip drop indicating R hip abductor weakness. Will continue to address stair navigation; however, pt's daughter open to idea of having to install a 3rd HR to have 2 HRs closer to one another vs trying an AD on one side so pt can have better leverage through B UE support as pt doesn't feel she gets adequate support from BUEs on R  HR. Pt reports increasing fatigue. Transported back to room in w/c. Stand pivot w/c>recliner using RW with CGA for steadying. Pt left seated in recliner with needs in reach and her daughter present awaiting OT.    Therapy Documentation Precautions:  Precautions Precautions: Posterior Hip, Fall Precaution Comments: pt able to recall 3/3 precautions Restrictions Weight Bearing Restrictions: Yes RLE Weight Bearing: Weight bearing as tolerated   Pain: Session 1: No reports of pain during session.  Session 2: Reports "a little" pain in R hip but states it is not significant and it does not limit participation in therapy session.   Therapy/Group: Individual Therapy  Ginny Forth , PT, DPT, NCS, CSRS 11/26/2020, 7:52 AM

## 2020-11-26 NOTE — Progress Notes (Signed)
Occupational Therapy Session Note  Patient Details  Name: Angelica Ferguson MRN: [id number] Date of Birth: 1932/08/11  Today's Date: 11/26/2020 OT Individual Time: 1515-1600 OT Individual Time Calculation (min): 45 min    Short Term Goals: Week 1:  OT Short Term Goal 1 (Week 1): STGs = LTGs d/t ELOS at Supervision  Skilled Therapeutic Interventions/Progress Updates:    1:1. Pt received in recliner with daughter present with no reported pain but fatigue. Pt reporting stairs difficulty and OT discusses adaption with "rock steady cane" and showed pt/daughter video. Both interested and alerted PTs to strategy. Pt completes functional mobility to dayroom with S and VC for looking forward where pt requesting to "ride the bike." Pt completes 4 min Nustep for reciprocal movement/BLE strengthening on level 3 workload. Pt returns to room and uses AE to doff clothing with min cuing and A to doff teds only. S to don gown and socks with sock aide. Exited session with pt seated in bed, exit alarm on and call light in reach   Therapy Documentation Precautions:  Precautions Precautions: Posterior Hip, Fall Precaution Comments: pt able to recall 2/3 precautions Restrictions Weight Bearing Restrictions: Yes RLE Weight Bearing: Weight bearing as tolerated General:   Vital Signs:   Pain:   ADL: ADL Eating: Not assessed Grooming: Setup Upper Body Bathing: Supervision/safety Lower Body Bathing: Minimal assistance Where Assessed-Lower Body Bathing: Standing at sink, Sitting at sink Upper Body Dressing: Supervision/safety Where Assessed-Upper Body Dressing: Sitting at sink Lower Body Dressing: Moderate assistance Where Assessed-Lower Body Dressing: Standing at sink, Sitting at sink Toileting: Minimal assistance Vision   Perception    Praxis   Exercises:   Other Treatments:     Therapy/Group: Individual Therapy  Shon Hale 11/26/2020, 12:56 PM

## 2020-11-26 NOTE — IPOC Note (Signed)
Overall Plan of Care [facility]) Patient Details Name: Imogene Gravelle MRN: [id number] DOB: Apr 14, 1933  Admitting Diagnosis: Closed fracture of right hip requiring operative repair, sequela  Hospital Problems: Principal Problem:   Closed fracture of right hip requiring operative repair, sequela Active Problems:   Pressure injury of skin     Functional Problem List: Nursing Bladder, Edema, Endurance, Medication Management, Pain, Safety, Skin Integrity  PT Balance, Endurance, Pain, Safety  OT Balance, Cognition, Endurance, Motor, Pain, Safety  SLP    TR         Basic ADL's: OT Grooming, Bathing, Dressing, Toileting     Advanced  ADL's: OT       Transfers: PT Bed Mobility, Bed to Chair, Car, Furniture, Floor  OT Toilet, Research scientist (life sciences): PT Ambulation, Psychologist, prison and probation services, Stairs     Additional Impairments: OT None  SLP        TR      Anticipated Outcomes Item Anticipated Outcome  Self Feeding no goal  Swallowing      Basic self-care  Civil Service fast streamer with LRAD  Communication     Cognition     Pain  < 3  Safety/Judgment  Supervision and no falls   Therapy Plan: PT Intensity: Minimum of 1-2 x/day ,45 to 90 minutes PT Frequency: 5 out of 7 days PT Duration Estimated Length of Stay: 7-10 days OT Intensity: Minimum of 1-2 x/day, 45 to 90 minutes OT Frequency: 5 out of 7 days OT Duration/Estimated Length of Stay: 7-10 days     Due to the current state of emergency, patients may not be receiving their 3-hours of Medicare-mandated therapy.   Team Interventions: Nursing Interventions Patient/Family Education, Bladder Management, Pain Management, Medication Management, Skin Care/Wound Management, Discharge Planning  PT interventions Ambulation/gait training, Balance/vestibular training, Community reintegration,  Discharge planning, DME/adaptive equipment instruction, Functional mobility training, Pain management, Patient/family education, Stair training, Therapeutic Activities, Therapeutic Exercise, UE/LE Strength taining/ROM, UE/LE Coordination activities  OT Interventions Balance/vestibular training, Discharge planning, Pain management, Self Care/advanced ADL retraining, Therapeutic Activities, UE/LE Coordination activities, Visual/perceptual remediation/compensation, Therapeutic Exercise, Skin care/wound managment, Patient/family education, Functional mobility training, Disease mangement/prevention, Cognitive remediation/compensation, Firefighter, Fish farm manager, Neuromuscular re-education, Psychosocial support, UE/LE Strength taining/ROM, Wheelchair propulsion/positioning  SLP Interventions    TR Interventions    SW/CM Interventions Discharge Planning, Psychosocial Support, Patient/Family Education   Barriers to Discharge MD  Medical stability  Nursing Decreased caregiver support, Home environment access/layout, Incontinence, Wound Care, Lack of/limited family support, Weight bearing restrictions, Medication compliance, Behavior Lives in a 1 level home with 3 steps to enter with right and left rails. Able to live on main level with bedroom/bathroom. Caregiver able to provide min assist and available 24/7.  PT Home environment access/layout    OT      SLP      SW Decreased caregiver support, Lack of/limited family support     Team Discharge Planning: Destination: PT-Home ,OT- Home , SLP-  Projected Follow-up: PT-Home health PT, OT-  Home health OT (HHOT vs none), SLP-  Projected Equipment Needs: PT-Rolling walker with 5" wheels, OT- To be determined, SLP-  Equipment Details: PT-needs RW, OT-  Patient/family involved in discharge planning: PT- Patient, Family member/caregiver,  OT-Patient, Family member/caregiver, SLP-   MD ELOS: 7-10 days Medical Rehab Prognosis:   Excellent Assessment: The patient has been admitted for CIR therapies with the  diagnosis of right hip fx s/p right hip hemi. The team will be addressing functional mobility, strength, stamina, balance, safety, adaptive techniques and equipment, self-care, bowel and bladder mgt, patient and caregiver education, pain mgt, hip precautions, community reentry. Goals have been set at supervision for mobility and self-care.   Due to the current state of emergency, patients may not be receiving their 3 hours per day of Medicare-mandated therapy.    Ranelle Oyster, MD, FAAPMR     See Team Conference Notes for weekly updates to the plan of care

## 2020-11-27 DIAGNOSIS — E119 Type 2 diabetes mellitus without complications: Secondary | ICD-10-CM | POA: Diagnosis not present

## 2020-11-27 DIAGNOSIS — I482 Chronic atrial fibrillation, unspecified: Secondary | ICD-10-CM | POA: Diagnosis not present

## 2020-11-27 DIAGNOSIS — S72001S Fracture of unspecified part of neck of right femur, sequela: Secondary | ICD-10-CM | POA: Diagnosis not present

## 2020-11-27 DIAGNOSIS — D62 Acute posthemorrhagic anemia: Secondary | ICD-10-CM | POA: Diagnosis not present

## 2020-11-27 LAB — GLUCOSE, CAPILLARY
Glucose-Capillary: 108 mg/dL — ABNORMAL HIGH (ref 70–99)
Glucose-Capillary: 142 mg/dL — ABNORMAL HIGH (ref 70–99)
Glucose-Capillary: 138 mg/dL — ABNORMAL HIGH (ref 70–99)
Glucose-Capillary: 111 mg/dL — ABNORMAL HIGH (ref 70–99)

## 2020-11-27 NOTE — Progress Notes (Signed)
Physical Therapy Session Note  Patient Details  Name: Angelica Ferguson MRN: [id number] Date of Birth: 24-Apr-1933  Today's Date: 11/27/2020 PT Individual Time: 1051-1200 PT Individual Time Calculation (min): 69 min   Short Term Goals: Week 1:  PT Short Term Goal 1 (Week 1): =LTG due to ELOS  Skilled Therapeutic Interventions/Progress Updates:    Pt sitting in recliner and agreeable to therapy upon entry.  Pt states right calf / foot ache and have increased swelling compared to previous day. Pt reports MD is aware. Confirmed patient is on blood thinners and safe to continue with therapy. Daughter present for session.   STS completed throughout session with CGA-close supervision. Recliner<>wc<>EOM completed with walking transfers RW CGA. EOM<>supine with supervision and verbal cuing for movements within precautions.   Mat table Exercises:  Sitting Hip bilateral ABD with level 1 tband 2x10  LAQ 15x (0.5# RLE, .75# LLE)  Supine bridging 2x10  SLS to ~45 degrees 10x each leg - AAROM RLE  6" Alt toe taps x16 and step ups LLE 2x10 BUE support on RW CGA. Visual targets utilized to prevent adduction of lower extremities. Pt demonstrated ability to easily clear 6" height and power up during step ups with BUE support (going up with LLE, down with RLE).   Forwards walking in // with stepping over obstacles 10x71f CGA varying heights. BUE 6x. LUE support 4x and intermittent MinA for steadying. Pt demonstrated hesitancy at first with slow movements and posterior leaning with stepping over objects. Improved posture with steps and ability to clear objects as activity progressed.   Gait training 1x476f [id number] and CGA. Pt demonstrates antalgic gait pattern with decreased stance on right, decreased step length on left. Verbal cuing provided for upright posture. During last bout back towards room daughter followed with wc 2/2 fatigue. Pt wheeled back towards room totalA.   All mobility performed  within posterior hip precautions with pt verbalizing understanding.  Pt in recliner with legs elevated, brakes locked, seatbelt alarm on, and call bell within reach. All needs met at this time.   Therapy Documentation Precautions:  Precautions Precautions: Posterior Hip, Fall Precaution Comments: pt able to recall 3/3 precautions Restrictions Weight Bearing Restrictions: Yes RLE Weight Bearing: Weight bearing as tolerated  Pain: Pain Assessment Pain Scale: Faces Pain Score: 3  Faces Pain Scale: Hurts a little bit Pain Type: Acute pain Pain Location: Leg Pain Orientation: Right Pain Descriptors / Indicators: Aching Pain Frequency: Intermittent Pain Onset: On-going Pain Intervention(s): Repositioned;Ambulation/increased activity   Therapy/Group: Individual Therapy  Oakes Mccready, SPT 11/27/2020, 12:06 PM

## 2020-11-27 NOTE — Progress Notes (Signed)
Physical Therapy Session Note  Patient Details  Name: Angelica Ferguson MRN: [id number] Date of Birth: [birth year]  Today's Date: 11/27/2020 PT Individual Time: 0215-0308 PT Individual Time Calculation (min): 53 min   Short Term Goals: Week 1:  PT Short Term Goal 1 (Week 1): =LTG due to ELOS  Skilled Therapeutic Interventions/Progress Updates: Pt presents sitting on EOB and agreeable to therapy.  Pt transfers w/ CGA and amb x 5' to w/c.  Pt requires occasional cueing for improving safety.  Pt wheeled to Dayroom for time conservation.  Pt amb multiple trials w/ CGA to close supervision and verbal cues for maintaining position in RW and to decrease antalgic gait RLE.  Pt amb up to 150' w/ equal reciprocal step lengths.  Pt amb stepping over cord w/ CGA and occ verbal cues.  Pt performed standing Connect 4 reaching overhead and outside of BOS.  Pt recalled 3/3 THR precautions.  Pt amb into room and remained sitting in recliner w/ chair alarm on and all needs in reach.      Therapy Documentation Precautions:  Precautions Precautions: Posterior Hip, Fall Precaution Comments: pt able to recall 3/3 precautions Restrictions Weight Bearing Restrictions: Yes RLE Weight Bearing: Weight bearing as tolerated General:   Vital Signs: Therapy Vitals Temp: 98.5 F (36.9 C) Temp Source: Oral Pulse Rate: 67 Resp: (!) 24 BP: (!) 130/56 Oxygen Therapy SpO2: 98 % Pain: achiness R LE, but no c/o pain.       Therapy/Group: Individual Therapy  Lucio Edward 11/27/2020, 4:11 PM

## 2020-11-27 NOTE — Progress Notes (Signed)
PROGRESS NOTE   Subjective/Complaints: Right lower leg/calf still a little sore today, now with sl swelling. Was up on her feet a lot yesterday, working on stairs, etc.  Otherwise doing quite well  ROS: Patient denies fever, rash, sore throat, blurred vision, nausea, vomiting, diarrhea, cough, shortness of breath or chest pain,   headache, or mood change.    Objective:   No results found. No results for input(s): WBC, HGB, HCT, PLT in the last 72 hours.  No results for input(s): NA, K, CL, CO2, GLUCOSE, BUN, CREATININE, CALCIUM in the last 72 hours.   Intake/Output Summary (Last 24 hours) at 11/27/2020 0911 Last data filed at 11/26/2020 1905 Gross per 24 hour  Intake 240 ml  Output --  Net 240 ml     Pressure Injury 11/23/20 Coccyx Mid Stage 2 -  Partial thickness loss of dermis presenting as a shallow open injury with a red, pink wound bed without slough. almost resolved (Active)  11/23/20 1836  Location: Coccyx  Location Orientation: Mid  Staging: Stage 2 -  Partial thickness loss of dermis presenting as a shallow open injury with a red, pink wound bed without slough.  Wound Description (Comments): almost resolved  Present on Admission: Yes    Physical Exam: Vital Signs Blood pressure (!) 128/96, pulse 94, temperature 98 F (36.7 C), temperature source Oral, resp. rate 18, height 5\' 3"  (1.6 m), weight 55.3 kg, SpO2 98 %.  Constitutional: No distress . Vital signs reviewed. HEENT: NCAT, EOMI, oral membranes moist Neck: supple Cardiovascular: RRR without murmur. No JVD    Respiratory/Chest: CTA Bilaterally without wheezes or rales. Normal effort    GI/Abdomen: BS +, non-tender, non-distended Ext: no clubbing, cyanosis, trace RLE edema Psych: pleasant and cooperative  Skin: right hip wound remains cdi. Sacrum with foam dressing, abrasion right knee Neuro:  Alert and oriented x 3. Normal insight and awareness. Intact  Memory. Normal language and speech. Cranial nerve exam unremarkable. Motor and sensory without focal deficits other than pain inhibition. Normal reflexes Musculoskeletal: right hip tender to palpation. Able to tolerate wb. Mild calf tenderness with palpation   Assessment/Plan: 1. Functional deficits which require 3+ hours per day of interdisciplinary therapy in a comprehensive inpatient rehab setting. Physiatrist is providing close team supervision and 24 hour management of active medical problems listed below. Physiatrist and rehab team continue to assess barriers to discharge/monitor patient progress toward functional and medical goals  Care Tool:  Bathing    Body parts bathed by patient: Right arm, Left arm, Chest, Abdomen, Front perineal area, Buttocks, Right upper leg, Left upper leg, Left lower leg, Face   Body parts bathed by helper: Right lower leg     Bathing assist Assist Level: Minimal Assistance - Patient > 75%     Upper Body Dressing/Undressing Upper body dressing   What is the patient wearing?: Pull over shirt    Upper body assist Assist Level: Set up assist    Lower Body Dressing/Undressing Lower body dressing      What is the patient wearing?: Incontinence brief, Pants     Lower body assist Assist for lower body dressing: Minimal Assistance - Patient > 75%  Toileting Toileting    Toileting assist Assist for toileting: Minimal Assistance - Patient > 75%     Transfers Chair/bed transfer  Transfers assist     Chair/bed transfer assist level: Contact Guard/Touching assist Chair/bed transfer assistive device: Geologist, engineering   Ambulation assist      Assist level: Contact Guard/Touching assist Assistive device: Walker-rolling Max distance: 150ft   Walk 10 feet activity   Assist     Assist level: Contact Guard/Touching assist Assistive device: Walker-rolling   Walk 50 feet activity   Assist    Assist level:  Contact Guard/Touching assist Assistive device: Walker-rolling    Walk 150 feet activity   Assist Walk 150 feet activity did not occur: Safety/medical concerns  Assist level: Contact Guard/Touching assist Assistive device: Walker-rolling    Walk 10 feet on uneven surface  activity   Assist Walk 10 feet on uneven surfaces activity did not occur: Safety/medical concerns         Wheelchair     Assist Will patient use wheelchair at discharge?: No             Wheelchair 50 feet with 2 turns activity    Assist            Wheelchair 150 feet activity     Assist          Blood pressure (!) 128/96, pulse 94, temperature 98 F (36.7 C), temperature source Oral, resp. rate 18, height 5\' 3"  (1.6 m), weight 55.3 kg, SpO2 98 %.  Medical Problem List and Plan: 1.  Functional and mobility deficits secondary to right femoral neck fx after fall 7/20. Right hip hemi performed 7/28             -WBAT, posterior hip precautions             -patient may shower             -ELOS/Goals: 7-10 days, supervision with PT/OT  -Continue CIR therapies including PT, OT  2.  Antithrombotics: -DVT/anticoagulation:  Pharmaceutical: Other (comment)--Savayas daily -given swelling and calf discomfort will check LE dopplers just to be sure             -antiplatelet therapy: N/A 3. Pain Management: Hydrocodone prn.   -heat, tylenol, elevation prn 4. Mood: LCSW to follow for evaluation and support.              -antipsychotic agents: N/A 5. Neuropsych: This patient is capable of making decisions on her own behalf. 6. Skin/Wound Care: Routine pressure relief measures. Daily dressing changes to right hip -pressure relief, foam dressing to coccyx   -encourage PO 7. Fluids/Electrolytes/Nutrition: encourage fluids                 8. A fib: Monitor HR TID--continue endoxaban daily --bisoprolol and Cardizem bid. 9. HTN: Monitor BP tid--watch for orthostatic changes. 10.  T2DM: Hgb  A1C-6.2--was on metformin PTA. --will monitor BS ac/hs and use SSI --continue to hold metformin 8/5 CBG (last 3)  Recent Labs    11/26/20 1655 11/26/20 2114 11/27/20 0629  GLUCAP 120* 153* 108*     11. Leucocytosis: Trending down 14.4, monitor for fevers and other signs of infection.  12. ABLA: hgb holding at 9.4 -stable  13. Acute renal failure: Encourage fluid intake-              -BUN/CR improved to 23/0.95             -push fluids,  recheck Monday 14. COPD: scheduled qvar, proventil prn. May use with therapy for exercise induced symptoms    LOS: 4 days A FACE TO FACE EVALUATION WAS PERFORMED  Ranelle Oyster 11/27/2020, 9:11 AM

## 2020-11-27 NOTE — Progress Notes (Signed)
Occupational Therapy Session Note  Patient Details  Name: Angelica Ferguson MRN: [id number] Date of Birth: December 03, 1932  Today's Date: 11/27/2020 OT Individual Time: 7416-3845 OT Individual Time Calculation (min): 70 min    Short Term Goals: Week 1:  OT Short Term Goal 1 (Week 1): STGs = LTGs d/t ELOS at Supervision  Skilled Therapeutic Interventions/Progress Updates:    Pt resting in recliner. Daughter and MD present. Pt completed bathing/dressing tasks with sit<>stand from w/c at sink. Pt required assistance with donning [facility]. Min verbal cues for use of reacher for threading pants. Sit<>stand and standing balance with supervision. Pt transitioned to ADL apartment to practice walking on carpeting and furniture transfers from recliner-CGA. Pt amb to main gym and rested in arm chair. Daughter took measurements of stairs for contractor modifying stairs at home. Pt amb with RW to elevators and used w/c to return to room. All amb with CGA. Pt returned to recliner. All needs within reach. Belt alarm activated and daughter present.  Therapy Documentation Precautions:  Precautions Precautions: Posterior Hip, Fall Precaution Comments: pt able to recall 3/3 precautions Restrictions Weight Bearing Restrictions: Yes RLE Weight Bearing: Weight bearing as tolerated  Pain: Pain Assessment Pain Scale: 0-10 Pain Score: 3  Pain Type: Acute pain Pain Location: Leg Pain Orientation: Right Pain Descriptors / Indicators: Aching Pain Frequency: Intermittent Pain Intervention(s): meds admin at beginning of therapy session   Therapy/Group: Individual Therapy  Rich Brave 11/27/2020, 9:26 AM

## 2020-11-28 ENCOUNTER — Inpatient Hospital Stay (HOSPITAL_COMMUNITY): Payer: Medicaid Other

## 2020-11-28 DIAGNOSIS — R609 Edema, unspecified: Secondary | ICD-10-CM | POA: Diagnosis not present

## 2020-11-28 DIAGNOSIS — S72001S Fracture of unspecified part of neck of right femur, sequela: Secondary | ICD-10-CM | POA: Diagnosis not present

## 2020-11-28 DIAGNOSIS — E119 Type 2 diabetes mellitus without complications: Secondary | ICD-10-CM | POA: Diagnosis not present

## 2020-11-28 DIAGNOSIS — D62 Acute posthemorrhagic anemia: Secondary | ICD-10-CM | POA: Diagnosis not present

## 2020-11-28 DIAGNOSIS — I482 Chronic atrial fibrillation, unspecified: Secondary | ICD-10-CM | POA: Diagnosis not present

## 2020-11-28 LAB — GLUCOSE, CAPILLARY
Glucose-Capillary: 104 mg/dL — ABNORMAL HIGH (ref 70–99)
Glucose-Capillary: 125 mg/dL — ABNORMAL HIGH (ref 70–99)
Glucose-Capillary: 109 mg/dL — ABNORMAL HIGH (ref 70–99)
Glucose-Capillary: 134 mg/dL — ABNORMAL HIGH (ref 70–99)

## 2020-11-28 NOTE — Progress Notes (Signed)
BLE venous duplex has been completed.  Results can be found under chart review under CV PROC. 11/28/2020 4:53 PM Joah Patlan RVT, RDMS

## 2020-11-28 NOTE — Discharge Instructions (Addendum)
Inpatient Rehab Discharge Instructions  Angelica Ferguson Discharge date and time: 12/03/20   Activities/Precautions/ Functional Status: Activity: no lifting, driving, or strenuous exercise till cleared by MD Diet: cardiac diet and diabetic diet Wound Care: keep wound clean and dry   Functional status:  ___ No restrictions     ___ Walk up steps independently _X__ 24/7 supervision/assistance   ___ Walk up steps with assistance ___ Intermittent supervision/assistance  ___ Bathe/dress independently ___ Walk with walker     ___ Bathe/dress with assistance ___ Walk Independently    ___ Shower independently ___ Walk with assistance    _X__ Shower with assistance __X_ No alcohol     ___ Return to work/school ________   Special Instructions:  Home Health:   PT      OT      SN                Agency:[facility] ([facility]) Phone:[phone] *Please expect follow-up to within 2-3 days to schedule home visit and/or complete a financial screen. If you have not received follow-up, be sure to contact the branch directly.*   Medical Equipment/Items Ordered:None recommended                                                 Agency/Supplier:N/A    My questions have been answered and I understand these instructions. I will adhere to these goals and the provided educational materials after my discharge from the hospital.  Patient/Caregiver Signature _______________________________ Date __________  Clinician Signature _______________________________________ Date __________  Please bring this form and your medication list with you to all your follow-up doctor's appointments.  COMMUNITY REFERRALS UPON DISCHARGE:       Information on my medicine - SAVAYSA (edoxaban)  This medication education was reviewed with me or my healthcare representative as part of my discharge preparation.  The pharmacist that spoke with me during my hospital stay was:    WHY WAS SAVAYSA PRESCRIBED FOR  YOU? Larkin Ina was prescribed to treat blood clots that may have been found in the veins of your legs (deep vein thrombosis) or in your lungs (pulmonary embolism) and to reduce the risk of them occurring again.  WHAT DO YOU NEED TO KNOW ABOUT SAVAYSA ? Take your Savaysa ONCE DAILY - one tablet in the morning with or without food.  It would be best to take the doses about the same time each day.  If you have difficulty swallowing the tablet whole please discuss with your pharmacist how to take the medication safely.  Take Savaysa exactly as prescribed by your doctor and DO NOT stop taking Savaysa without talking to the doctor who prescribed the medication.  Stopping may increase your risk of developing a new clot or stroke.  Refill your prescription before you run out.  After discharge, you should have regular check-up appointments with your healthcare provider that is prescribing your Savaysa.  In the future your dose may need to be changed if your kidney function or weight changes by a significant amount or as you get older.  WHAT DO YOU DO IF YOU MISS A DOSE? If you are taking Savaysa ONCE DAILY and you miss a dose, take it as soon as you remember on the same day then continue your regularly scheduled once daily regimen the next day. Do not take two doses of Savaysa  at the same time or on the same day.  IMPORTANT SAFETY INFORMATION A possible side effect of Savaysa is bleeding. You should call your healthcare provider right away if you experience any of the following: Bleeding from an injury or your nose that does not stop. Unusual colored urine (red or dark brown) or unusual colored stools (red or black). Unusual bruising for unknown reasons. A serious fall or if you hit your head (even if there is no bleeding).  Some medicines may interact with Savaysa and might increase your risk of bleeding or clotting while on Savaysa. To help avoid this, consult your healthcare provider or  pharmacist prior to using any new prescription or non-prescription medications, including herbals, vitamins, non-steroidal anti-inflammatory drugs (NSAIDs) and supplements.  This website has more information on Savaysa (edoxaban): [email or web address].com

## 2020-11-28 NOTE — Progress Notes (Signed)
Reports restless night of sleep. PRN trazodone 50mg 's given at 2043. Denies pain. Dry dressing to right hip, foam dressing to right knee and right ankle. Initial assessment at 2015, 2+ pitting pedal edema. Patient reports edema is getting better. 2044 A

## 2020-11-28 NOTE — Progress Notes (Signed)
Occupational Therapy Session Note  Patient Details  Name: Angelica Ferguson MRN: [id number] Date of Birth: 1933-01-14  Today's Date: 11/29/2020 OT Individual Time: 2440-1027 and 2536-6440 OT Individual Time Calculation (min): 64 min and 58 min  Short Term Goals: Week 1:  OT Short Term Goal 1 (Week 1): STGs = LTGs d/t ELOS at Supervision  Skilled Therapeutic Interventions/Progress Updates:    Pt greeted in bed with RN present. Pt receiving morning medication. Agreeable to shower this morning. OT first covered Rt hip incision. PT then ambulated with RW and CGA to the TTB in shower. Pt sitting forward on bench to maximize Rt hip extension for hip precautions. She also used the LH sponge with cues for precaution adherence when washing the Rt LE. Perihygiene completed using lean techniques. Pt also got to wash her hair which brightened affect. Dressing was then completed at sit<stand level from EOB using RW and reacher as needed. Cues to dress Rt LE first. Daughter had hands on practice with donning 1 Ted stocking using adaptive bag technique. Pt needed assistance for donning shoes as daughter brought her new ones with straps to accommodate swelling in Rt foot. After blow-drying + combing hair, pt transferred to the bedside recliner. Left pt with all needs within reach and daughter Angelica Ferguson present. Tx focus placed on adaptive self care skills, balance, functional cognition, and pt/caregiver education.   Pt able to recall 3/3 hip precautions however needed cues throughout session for functional carryover  2nd Session 1:1 tx (58 min) Pt greeted in the recliner with daughter Angelica Ferguson present. Daughter asking if pt could go down to the gift shop to purchase a new nightgown. Pt transferred with RW and supervision to the w/c and was then escorted down to the gift shop. It was closed. Therefore we transitioned therapeutic focus to functional mobility in the community setting. Pt used the RW to ambulate with close  supervision assistance, min cues to keep hip externally rotated when turning towards the Lt for precaution adherence. Note that she needed to take frequent seated rest breaks due to limited standing endurance. Per daughter, this is baseline, aware that they need to take breaks when out in the community together (shopping, running errands, etc). Pt was then returned to the room via w/c and transferred to the recliner using device. Daughter and pt participated in transfer training education, daughter Angelica Ferguson having hands on practice with assisting during transfers given min cues for remembering to use the gait belt. Cleared her to assist with chair + toilet transfers on safety plan. Pt remained in the recliner at close of session, all needs within reach.    Therapy Documentation Precautions:  Precautions Precautions: Posterior Hip, Fall Precaution Comments: pt able to recall 3/3 precautions Restrictions Weight Bearing Restrictions: Yes RLE Weight Bearing: Weight bearing as tolerated  Pain: no c/o pain during 1st session, RN in during 2nd session to provide pain medicine per pt request   ADL: ADL Eating: Not assessed Grooming: Setup Upper Body Bathing: Supervision/safety Lower Body Bathing: Minimal assistance Where Assessed-Lower Body Bathing: Standing at sink, Sitting at sink Upper Body Dressing: Supervision/safety Where Assessed-Upper Body Dressing: Sitting at sink Lower Body Dressing: Moderate assistance Where Assessed-Lower Body Dressing: Standing at sink, Sitting at sink Toileting: Minimal assistance      Therapy/Group: Individual Therapy  Angelica Ferguson A Angelica Ferguson 11/29/2020, 12:32 PM

## 2020-11-28 NOTE — Progress Notes (Signed)
PROGRESS NOTE   Subjective/Complaints: Doing well this morning.  Swelling improved and right leg as well as foot.  ROS: Patient denies fever, rash, sore throat, blurred vision, nausea, vomiting, diarrhea, cough, shortness of breath or chest pain,   headache, or mood change.    Objective:   No results found. No results for input(s): WBC, HGB, HCT, PLT in the last 72 hours.  No results for input(s): NA, K, CL, CO2, GLUCOSE, BUN, CREATININE, CALCIUM in the last 72 hours.   Intake/Output Summary (Last 24 hours) at 11/28/2020 1047 Last data filed at 11/28/2020 0900 Gross per 24 hour  Intake 716 ml  Output --  Net 716 ml     Pressure Injury 11/23/20 Coccyx Mid Stage 2 -  Partial thickness loss of dermis presenting as a shallow open injury with a red, pink wound bed without slough. almost resolved (Active)  11/23/20 1836  Location: Coccyx  Location Orientation: Mid  Staging: Stage 2 -  Partial thickness loss of dermis presenting as a shallow open injury with a red, pink wound bed without slough.  Wound Description (Comments): almost resolved  Present on Admission: Yes    Physical Exam: Vital Signs Blood pressure 137/74, pulse 98, temperature 98.3 F (36.8 C), temperature source Oral, resp. rate 17, height 5\' 3"  (1.6 m), weight 55.3 kg, SpO2 100 %.  Constitutional: No distress . Vital signs reviewed. HEENT: NCAT, EOMI, oral membranes moist Neck: supple Cardiovascular: RRR without murmur. No JVD    Respiratory/Chest: CTA Bilaterally without wheezes or rales. Normal effort    GI/Abdomen: BS +, non-tender, non-distended Ext: no clubbing, cyanosis, or edema Psych: pleasant and cooperative  Skin: right hip wound remains cdi. Sacrum with foam dressing, abrasion right knee Neuro:  Alert and oriented x 3. Normal insight and awareness. Intact Memory. Normal language and speech. Cranial nerve exam unremarkable. Motor and sensory  without focal deficits other than pain inhibition. Normal reflexes Musculoskeletal: right hip tender to palpation. Able to tolerate wb. Mild calf tenderness with palpation   Assessment/Plan: 1. Functional deficits which require 3+ hours per day of interdisciplinary therapy in a comprehensive inpatient rehab setting. Physiatrist is providing close team supervision and 24 hour management of active medical problems listed below. Physiatrist and rehab team continue to assess barriers to discharge/monitor patient progress toward functional and medical goals  Care Tool:  Bathing    Body parts bathed by patient: Right arm, Left arm, Chest, Abdomen, Front perineal area, Buttocks, Right upper leg, Left upper leg, Left lower leg, Face   Body parts bathed by helper: Right lower leg     Bathing assist Assist Level: Minimal Assistance - Patient > 75%     Upper Body Dressing/Undressing Upper body dressing   What is the patient wearing?: Pull over shirt    Upper body assist Assist Level: Set up assist    Lower Body Dressing/Undressing Lower body dressing      What is the patient wearing?: Incontinence brief, Pants     Lower body assist Assist for lower body dressing: Minimal Assistance - Patient > 75%     Toileting Toileting    Toileting assist Assist for toileting: Minimal Assistance -  Patient > 75%     Transfers Chair/bed transfer  Transfers assist     Chair/bed transfer assist level: Contact Guard/Touching assist Chair/bed transfer assistive device: Geologist, engineering   Ambulation assist      Assist level: Contact Guard/Touching assist Assistive device: Walker-rolling Max distance: 178ft   Walk 10 feet activity   Assist     Assist level: Contact Guard/Touching assist Assistive device: Walker-rolling   Walk 50 feet activity   Assist    Assist level: Contact Guard/Touching assist Assistive device: Walker-rolling    Walk 150 feet  activity   Assist Walk 150 feet activity did not occur: Safety/medical concerns  Assist level: Contact Guard/Touching assist Assistive device: Walker-rolling    Walk 10 feet on uneven surface  activity   Assist Walk 10 feet on uneven surfaces activity did not occur: Safety/medical concerns         Wheelchair     Assist Will patient use wheelchair at discharge?: No             Wheelchair 50 feet with 2 turns activity    Assist            Wheelchair 150 feet activity     Assist          Blood pressure 137/74, pulse 98, temperature 98.3 F (36.8 C), temperature source Oral, resp. rate 17, height 5\' 3"  (1.6 m), weight 55.3 kg, SpO2 100 %.  Medical Problem List and Plan: 1.  Functional and mobility deficits secondary to right femoral neck fx after fall 7/20. Right hip hemi performed 7/28             -WBAT, posterior hip precautions             -patient may shower             -ELOS/Goals: 7-10 days, supervision with PT/OT  -Continue CIR therapies including PT, OT   2.  Antithrombotics: -DVT/anticoagulation:  Pharmaceutical: Other (comment)--Savayas daily -Awaiting Dopplers to check right lower extremity.  These were ordered 24 hours ago             -antiplatelet therapy: N/A 3. Pain Management: Hydrocodone prn.   -heat, tylenol, elevation prn 4. Mood: LCSW to follow for evaluation and support.              -antipsychotic agents: N/A 5. Neuropsych: This patient is capable of making decisions on her own behalf. 6. Skin/Wound Care: Routine pressure relief measures. Daily dressing changes to right hip -pressure relief, foam dressing to coccyx   -encourage PO 7. Fluids/Electrolytes/Nutrition: encourage fluids                 8. A fib: Monitor HR TID--continue endoxaban daily --bisoprolol and Cardizem bid. 9. HTN: Monitor BP tid--watch for orthostatic changes. 10.  T2DM: Hgb A1C-6.2--was on metformin PTA. --will monitor BS ac/hs and use SSI --  Controlled despite holding metformin  CBG (last 3)  Recent Labs    11/27/20 1616 11/27/20 2105 11/28/20 0605  GLUCAP 138* 111* 104*     11. Leucocytosis: Trending down 14.4, monitor for fevers and other signs of infection.  12. ABLA: hgb holding at 9.4 -stable  13. Acute renal failure: Encourage fluid intake-              -BUN/CR improved to 23/0.95             -push fluids, recheck Monday 14. COPD: scheduled qvar, proventil prn. May use with  therapy for exercise induced symptoms    LOS: 5 days A FACE TO FACE EVALUATION WAS PERFORMED  Ranelle Oyster 11/28/2020, 10:47 AM

## 2020-11-29 DIAGNOSIS — E119 Type 2 diabetes mellitus without complications: Secondary | ICD-10-CM | POA: Diagnosis not present

## 2020-11-29 DIAGNOSIS — S72001S Fracture of unspecified part of neck of right femur, sequela: Secondary | ICD-10-CM | POA: Diagnosis not present

## 2020-11-29 DIAGNOSIS — I482 Chronic atrial fibrillation, unspecified: Secondary | ICD-10-CM | POA: Diagnosis not present

## 2020-11-29 DIAGNOSIS — D62 Acute posthemorrhagic anemia: Secondary | ICD-10-CM | POA: Diagnosis not present

## 2020-11-29 LAB — GLUCOSE, CAPILLARY
Glucose-Capillary: 99 mg/dL (ref 70–99)
Glucose-Capillary: 110 mg/dL — ABNORMAL HIGH (ref 70–99)
Glucose-Capillary: 120 mg/dL — ABNORMAL HIGH (ref 70–99)
Glucose-Capillary: 148 mg/dL — ABNORMAL HIGH (ref 70–99)

## 2020-11-29 NOTE — Progress Notes (Signed)
PROGRESS NOTE   Subjective/Complaints: In good spirits. Slept well. RLE swelling much better.   ROS: Patient denies fever, rash, sore throat, blurred vision, nausea, vomiting, diarrhea, cough, shortness of breath or chest pain, joint or back pain, headache, or mood change.    Objective:   VAS US LOWER EXTREMITY VENOUS (DVT)  Result Date: 11/28/2020  Lower Venous DVT Study Patient Name:  Angelica PontoCOLLEEN Madewell  Date of Exam:   11/28/2020 Medical Rec #: [id number]        Accession #:    [id number] Date of Birth: 1932-07-08       Patient Gender: F Patient Age:   [age] years [facility] Procedure:      VAS US LOWER EXTREMITY VENOUS (DVT) Referring Phys: Faith RogueZACHARY Cashis Rill --------------------------------------------------------------------------------  Indications: RLE edema.  Risk Factors: Surgery S/P hip fracture repair (11/19/20). Comparison Study: No previous exams Performing Technologist: Jody Hill RVT, RDMS  Examination Guidelines: A complete evaluation includes B-mode imaging, spectral Doppler, color Doppler, and power Doppler as needed of all accessible portions of each vessel. Bilateral testing is considered an integral part of a complete examination. Limited examinations for reoccurring indications may be performed as noted. The reflux portion of the exam is performed with the patient in reverse Trendelenburg.  +---------+---------------+---------+-----------+----------+--------------+ RIGHT    CompressibilityPhasicitySpontaneityPropertiesThrombus Aging +---------+---------------+---------+-----------+----------+--------------+ CFV      Full           Yes      Yes                                 +---------+---------------+---------+-----------+----------+--------------+ SFJ      Full                                                        +---------+---------------+---------+-----------+----------+--------------+ FV  Prox  Full           Yes      Yes                                 +---------+---------------+---------+-----------+----------+--------------+ FV Mid   Full           Yes      Yes                                 +---------+---------------+---------+-----------+----------+--------------+ FV DistalFull           Yes      Yes                                 +---------+---------------+---------+-----------+----------+--------------+ PFV      Full                                                        +---------+---------------+---------+-----------+----------+--------------+  POP      Full           Yes      Yes                                 +---------+---------------+---------+-----------+----------+--------------+ PTV      Full                                                        +---------+---------------+---------+-----------+----------+--------------+ PERO     Full                                                        +---------+---------------+---------+-----------+----------+--------------+   +---------+---------------+---------+-----------+----------+--------------+ LEFT     CompressibilityPhasicitySpontaneityPropertiesThrombus Aging +---------+---------------+---------+-----------+----------+--------------+ CFV      Full           Yes      Yes                                 +---------+---------------+---------+-----------+----------+--------------+ SFJ      Full                                                        +---------+---------------+---------+-----------+----------+--------------+ FV Prox  Full           Yes      Yes                                 +---------+---------------+---------+-----------+----------+--------------+ FV Mid   Full           Yes      Yes                                 +---------+---------------+---------+-----------+----------+--------------+ FV DistalFull           Yes      Yes                                  +---------+---------------+---------+-----------+----------+--------------+ PFV      Full                                                        +---------+---------------+---------+-----------+----------+--------------+ POP      Full           Yes      Yes                                 +---------+---------------+---------+-----------+----------+--------------+ PTV  Full                                                        +---------+---------------+---------+-----------+----------+--------------+ PERO     Full                                                        +---------+---------------+---------+-----------+----------+--------------+    Summary: BILATERAL: - No evidence of deep vein thrombosis seen in the lower extremities, bilaterally. - No evidence of superficial venous thrombosis in the lower extremities, bilaterally. -No evidence of popliteal cyst, bilaterally. RIGHT: Subcutaneous edema in area of pop fossa and calf   *See table(s) above for measurements and observations.    Preliminary    No results for input(s): WBC, HGB, HCT, PLT in the last 72 hours.  No results for input(s): NA, K, CL, CO2, GLUCOSE, BUN, CREATININE, CALCIUM in the last 72 hours.   Intake/Output Summary (Last 24 hours) at 11/29/2020 0915 Last data filed at 11/28/2020 2300 Gross per 24 hour  Intake 712 ml  Output --  Net 712 ml     Pressure Injury 11/23/20 Coccyx Mid Stage 2 -  Partial thickness loss of dermis presenting as a shallow open injury with a red, pink wound bed without slough. almost resolved (Active)  11/23/20 1836  Location: Coccyx  Location Orientation: Mid  Staging: Stage 2 -  Partial thickness loss of dermis presenting as a shallow open injury with a red, pink wound bed without slough.  Wound Description (Comments): almost resolved  Present on Admission: Yes    Physical Exam: Vital Signs Blood pressure 132/70, pulse 86, temperature 98.2 F  (36.8 C), temperature source Oral, resp. rate 18, height 5\' 3"  (1.6 m), weight 55.3 kg, SpO2 96 %.  Constitutional: No distress . Vital signs reviewed. HEENT: NCAT, EOMI, oral membranes moist Neck: supple Cardiovascular: RRR without murmur. No JVD    Respiratory/Chest: CTA Bilaterally without wheezes or rales. Normal effort    GI/Abdomen: BS +, non-tender, non-distended Ext: no clubbing, cyanosis, tr RLE edema Psych: pleasant and cooperative  Skin: right hip wound remains cdi. Sacrum with foam dressing, abrasion right knee Neuro:  Alert and oriented x 3. Normal insight and awareness. Intact Memory. Normal language and speech. Cranial nerve exam unremarkable. Motor and sensory without focal deficits other than pain inhibition. Normal reflexes Musculoskeletal: right hip tender to palpation. Able to tolerate wb. Minimal calf tenderness with palpation   Assessment/Plan: 1. Functional deficits which require 3+ hours per day of interdisciplinary therapy in a comprehensive inpatient rehab setting. Physiatrist is providing close team supervision and 24 hour management of active medical problems listed below. Physiatrist and rehab team continue to assess barriers to discharge/monitor patient progress toward functional and medical goals  Care Tool:  Bathing    Body parts bathed by patient: Right arm, Left arm, Chest, Abdomen, Front perineal area, Buttocks, Right upper leg, Left upper leg, Left lower leg, Face, Right lower leg   Body parts bathed by helper: Right lower leg     Bathing assist Assist Level: Supervision/Verbal cueing (using LH sponge as needed)     Upper Body Dressing/Undressing Upper body dressing  What is the patient wearing?: Pull over shirt    Upper body assist Assist Level: Set up assist    Lower Body Dressing/Undressing Lower body dressing      What is the patient wearing?: Underwear/pull up, Pants     Lower body assist Assist for lower body dressing: Contact  Guard/Touching assist     Toileting Toileting    Toileting assist Assist for toileting: Minimal Assistance - Patient > 75%     Transfers Chair/bed transfer  Transfers assist     Chair/bed transfer assist level: Contact Guard/Touching assist Chair/bed transfer assistive device: Geologist, engineering   Ambulation assist      Assist level: Contact Guard/Touching assist Assistive device: Walker-rolling Max distance: 170ft   Walk 10 feet activity   Assist     Assist level: Contact Guard/Touching assist Assistive device: Walker-rolling   Walk 50 feet activity   Assist    Assist level: Contact Guard/Touching assist Assistive device: Walker-rolling    Walk 150 feet activity   Assist Walk 150 feet activity did not occur: Safety/medical concerns  Assist level: Contact Guard/Touching assist Assistive device: Walker-rolling    Walk 10 feet on uneven surface  activity   Assist Walk 10 feet on uneven surfaces activity did not occur: Safety/medical concerns         Wheelchair     Assist Will patient use wheelchair at discharge?: No             Wheelchair 50 feet with 2 turns activity    Assist            Wheelchair 150 feet activity     Assist          Blood pressure 132/70, pulse 86, temperature 98.2 F (36.8 C), temperature source Oral, resp. rate 18, height 5\' 3"  (1.6 m), weight 55.3 kg, SpO2 96 %.  Medical Problem List and Plan: 1.  Functional and mobility deficits secondary to right femoral neck fx after fall 7/20. Right hip hemi performed 7/28             -WBAT, posterior hip precautions             -patient may shower             -ELOS/Goals: 8/11, supervision with PT/OT  -Continue CIR therapies including PT, OT   2.  Antithrombotics: -DVT/anticoagulation:  Pharmaceutical: Other (comment)--Savayas daily -LE venous dopplers negative---d/w patient today             -antiplatelet therapy: N/A 3. Pain  Management: Hydrocodone prn.   -heat, tylenol, elevation prn 4. Mood: LCSW to follow for evaluation and support.              -antipsychotic agents: N/A 5. Neuropsych: This patient is capable of making decisions on her own behalf. 6. Skin/Wound Care: Routine pressure relief measures. Daily dressing changes to right hip -pressure relief, foam dressing to coccyx   -encourage PO 7. Fluids/Electrolytes/Nutrition: encourage fluids                 8. A fib: Monitor HR TID--continue endoxaban daily --bisoprolol and Cardizem bid. 9. HTN: Monitor BP tid--watch for orthostatic changes. 10.  T2DM: Hgb A1C-6.2--was on metformin PTA. --will monitor BS ac/hs and use SSI --Well Controlled despite holding metformin  CBG (last 3)  Recent Labs    11/28/20 1622 11/28/20 2116 11/29/20 0603  GLUCAP 109* 134* 99     11. Leucocytosis: Trending down 14.4, monitor for  fevers and other signs of infection.  12. ABLA: hgb holding at 9.4 -stable  13. Acute renal failure: Encourage fluid intake-              -BUN/CR improved to 23/0.95             -push fluids, recheck Monday 14. COPD: scheduled qvar, proventil prn. May use with therapy for exercise induced symptoms    LOS: 6 days A FACE TO FACE EVALUATION WAS PERFORMED  Ranelle Oyster 11/29/2020, 9:15 AM

## 2020-11-29 NOTE — Progress Notes (Signed)
PRN trazodone 50mg 's given at 2007. Slept good. Changed sacral foam dressing this morning. No skin breakdown, very red over bony prominence. Patient complains of discomfort to sacral area. Decrease in Pitting pedal edema. Asking about results from doppler study. Denies pain to right hip. 2008 A

## 2020-11-29 NOTE — Progress Notes (Signed)
Physical Therapy Session Note  Patient Details  Name: Angelica Ferguson MRN: [id number] Date of Birth: Mar 24, 1933  Today's Date: 11/29/2020 PT Individual Time: 0923-3007 PT Individual Time Calculation (min): 40 min   Short Term Goals: Week 1:  PT Short Term Goal 1 (Week 1): =LTG due to ELOS  Skilled Therapeutic Interventions/Progress Updates:     Patient in recliner with her daughter in the room upon PT arrival. Patient alert and agreeable to PT session. Patient denied pain during session.  Therapeutic Activity: Transfers: Patient performed sit to/from stand x3 and stand pivot x2 with CGA-close supervision using RW, patient's daughter provided assist and set-up without cues on last stand pivot transfer with patient. Provided verbal cues for hand placement x1, completing turn x2, and reaching back to control descent x1. Patient often teaches back cues during transfers to remind herself.   Gait Training:  Patient ambulated 136 feet using RW with CGA-close supervision. Ambulated with reciprocal gait pattern with good posture and minimal antalgic gait on R. Provided verbal cues for paced breathing and self assessment of fatigue to initiate seated rest break. Patient's daughter reports that the biggest concern for d/c are the stairs to enter the home. Stated that a middle rail is being added so that the patient and reach 2 rails, as attempts with 1 rail were not very successful. Also reports that a ramp is being placed in the garage.  Patient ascended/descended 4x6" steps x2 with seated rest break in between using B rails with min A-CGA. Performed step-to gait pattern leading with L while ascending and R while descending. Provided cues for technique and sequencing, patient able to teach-back sequencing on second trial.   Wheelchair Mobility:  Patient was transported in the w/c with total A throughout session for energy conservation and time management.  Patient required increased rest breaks  between activities, discussed POC, d/c planning, PT progression with HHPT, returning to active lifestyle, and community safety, as patient has had 2 falls in the community on stairs PTA.   Patient in recliner with her daughter in the room at end of session with breaks locked, chair alarm set, and all needs within reach.   Therapy Documentation Precautions:  Precautions Precautions: Posterior Hip, Fall Precaution Comments: pt able to recall 3/3 precautions Restrictions Weight Bearing Restrictions: Yes RLE Weight Bearing: Weight bearing as tolerated    Therapy/Group: Individual Therapy  [place] Whitefield L Keyasia Jolliff PT, DPT  11/29/2020, 12:27 PM

## 2020-11-29 NOTE — Progress Notes (Signed)
Physical Therapy Session Note  Patient Details  Name: Angelica Ferguson MRN: [id number] Date of Birth: 15-Dec-1932  Today's Date: 11/29/2020 PT Individual Time: 0900-0958 PT Individual Time Calculation (min): 58 min   Short Term Goals: Week 1:  PT Short Term Goal 1 (Week 1): =LTG due to ELOS  Skilled Therapeutic Interventions/Progress Updates:  Pt received supine in bed with NT present. Pt awaiting respiratory therapy, but willing to participate in session after clarifying medications with NT. Pt c/o light pain in groin, but stated in reduced with movement. Session focused on Week 1 goals, but with pt noted to have 2nd PT session shortly after, this session focused on prolonged standing tolerance, ambulation and family education for safe mobility. In session, pt completed transfer to [facility] with CGA + RW. Pt then completed prolonged standing practice and reaches outside BOS at table during game of checkers. Pt was able to stand with supervision for , 7 mins, and 5 mins. Pt then completed 2x 170ft amb with CGA + RW. Pt then returned to room for family training on assisting mobility. Daughter was education on and demonstrated ability for assisting pt from [facility] to recliner. At end of session, pt was left seated in recliner with RT and family present, alarm engaged, call bell and all needs in reach.   Therapy Documentation Precautions:  Precautions Precautions: Posterior Hip, Fall Precaution Comments: pt able to recall 3/3 precautions Restrictions Weight Bearing Restrictions: Yes RLE Weight Bearing: Weight bearing as tolerated     Therapy/Group: Individual Therapy  Perrin Maltese, PT, DPT 11/29/2020, 9:58 AM

## 2020-11-29 NOTE — Progress Notes (Signed)
Occupational Therapy Session Note  Patient Details  Name: Namrata Dangler MRN: [id number] Date of Birth: 1933/01/05  Today's Date: 11/30/2020 OT Individual Time: 1448-1856 OT Individual Time Calculation (min): 55 min    Short Term Goals: Week 1:  OT Short Term Goal 1 (Week 1): STGs = LTGs d/t ELOS at Supervision  Skilled Therapeutic Interventions/Progress Updates:    Pt greeted while in the recliner, no c/o pain. Her daughter Fannie Knee assisted with ADLs this morning. Started session with family education. Fannie Knee showed OT a picture of their home bathroom. They have a swivel shower seat that can slide into the tub shower. They also have toilet bars and an elevated toilet. OT recommended removal of the bathroom rugs to reduce fall risk. Also advised having  3:1 for nighttime toileting to once again decrease fall risk. Both pt and daughter receptive to education. We talked about importance of including pt in IADL activity at home to maintain current functional level, provided recommendations regarding safe ways to do this during meal prep, laundry, and general housekeeping. Pt really wanted to go down to the gift shop during session. Fannie Knee assisted pt with the w/c transfer, setting up the w/c and providing CGA while pt used the RW. Pt was escorted down to the atrium and the gift shop was not yet open. Pt ambulated several short distances using RW with Fannie Knee providing CGA. Fannie Knee plans to take pt out in the community at time of d/c for shopping and errands. She plans to take the w/c and also the walker, aware that pt will need frequent breaks when ambulating. When the gift shop opened, OT taught pt how to navigate the w/c. She was able to maintain straight path and change directions with supervision assist for short windows of time due to fatigue, Fannie Knee then assisted with w/c mobility. Pts daughter bought her a new nightgown which brightened affect. She was then escorted back to her room and Fannie Knee assisted pt with transfer  back to the recliner. Min cues for management of leg rests throughout session with Fannie Knee needing increased time to meet task demands. Pt remained comfortably in the recliner at close of session, left with RN to receive her inhaler treatment. Fannie Knee had no further questions for OT regarding d/c.   Therapy Documentation Precautions:  Precautions Precautions: Posterior Hip, Fall Precaution Comments: pt able to recall 3/3 precautions Restrictions Weight Bearing Restrictions: Yes RLE Weight Bearing: Weight bearing as tolerated  Pain: no s/s pain during tx   ADL: ADL Eating: Not assessed Grooming: Setup Upper Body Bathing: Supervision/safety Lower Body Bathing: Minimal assistance Where Assessed-Lower Body Bathing: Standing at sink, Sitting at sink Upper Body Dressing: Supervision/safety Where Assessed-Upper Body Dressing: Sitting at sink Lower Body Dressing: Moderate assistance Where Assessed-Lower Body Dressing: Standing at sink, Sitting at sink Toileting: Minimal assistance      Therapy/Group: Individual Therapy  Braylin Xu A Isak Sotomayor 11/30/2020, 12:13 PM

## 2020-11-30 DIAGNOSIS — S72001S Fracture of unspecified part of neck of right femur, sequela: Secondary | ICD-10-CM | POA: Diagnosis not present

## 2020-11-30 LAB — BASIC METABOLIC PANEL
Anion gap: 6 (ref 5–15)
BUN: 19 mg/dL (ref 8–23)
CO2: 28 mmol/L (ref 22–32)
Calcium: 8.8 mg/dL — ABNORMAL LOW (ref 8.9–10.3)
Chloride: 101 mmol/L (ref 98–111)
Creatinine, Ser: 1.05 mg/dL — ABNORMAL HIGH (ref 0.44–1.00)
GFR, Estimated: 52 mL/min — ABNORMAL LOW (ref >60)
Glucose, Bld: 110 mg/dL — ABNORMAL HIGH (ref 70–99)
Potassium: 3.8 mmol/L (ref 3.5–5.1)
Sodium: 135 mmol/L (ref 135–145)

## 2020-11-30 LAB — CBC
HCT: 26.6 % — ABNORMAL LOW (ref 36.0–46.0)
Hemoglobin: 8.3 g/dL — ABNORMAL LOW (ref 12.0–15.0)
MCH: 27.3 pg (ref 26.0–34.0)
MCHC: 31.2 g/dL (ref 30.0–36.0)
MCV: 87.5 fL (ref 80.0–100.0)
Platelets: 545 10*3/uL — ABNORMAL HIGH (ref 150–400)
RBC: 3.04 MIL/uL — ABNORMAL LOW (ref 3.87–5.11)
RDW: 16.7 % — ABNORMAL HIGH (ref 11.5–15.5)
WBC: 9 10*3/uL (ref 4.0–10.5)
nRBC: 0 % (ref 0.0–0.2)

## 2020-11-30 LAB — GLUCOSE, CAPILLARY
Glucose-Capillary: 108 mg/dL — ABNORMAL HIGH (ref 70–99)
Glucose-Capillary: 113 mg/dL — ABNORMAL HIGH (ref 70–99)
Glucose-Capillary: 117 mg/dL — ABNORMAL HIGH (ref 70–99)
Glucose-Capillary: 148 mg/dL — ABNORMAL HIGH (ref 70–99)

## 2020-11-30 NOTE — Progress Notes (Signed)
PROGRESS NOTE   Subjective/Complaints: Pt reports that aching pain after surgery is gone- LBM yesterday - no pain overall;; and feels good- slept well.     ROS:  Pt denies SOB, abd pain, CP, N/V/C/D, and vision changes    Objective:   VAS US LOWER EXTREMITY VENOUS (DVT)  Result Date: 11/29/2020  Lower Venous DVT Study Patient Name:  Dell PontoCOLLEEN Guerrero  Date of Exam:   11/28/2020 Medical Rec #: [id number]        Accession #:    [id number] Date of Birth: Jan 06, 1933       Patient Gender: F Patient Age:   [age] years [facility] Procedure:      VAS US LOWER EXTREMITY VENOUS (DVT) Referring Phys: Faith RogueZACHARY SWARTZ --------------------------------------------------------------------------------  Indications: RLE edema.  Risk Factors: Surgery S/P hip fracture repair (11/19/20). Comparison Study: No previous exams Performing Technologist: Jody Hill RVT, RDMS  Examination Guidelines: A complete evaluation includes B-mode imaging, spectral Doppler, color Doppler, and power Doppler as needed of all accessible portions of each vessel. Bilateral testing is considered an integral part of a complete examination. Limited examinations for reoccurring indications may be performed as noted. The reflux portion of the exam is performed with the patient in reverse Trendelenburg.  +---------+---------------+---------+-----------+----------+--------------+ RIGHT    CompressibilityPhasicitySpontaneityPropertiesThrombus Aging +---------+---------------+---------+-----------+----------+--------------+ CFV      Full           Yes      Yes                                 +---------+---------------+---------+-----------+----------+--------------+ SFJ      Full                                                        +---------+---------------+---------+-----------+----------+--------------+ FV Prox  Full           Yes      Yes                                  +---------+---------------+---------+-----------+----------+--------------+ FV Mid   Full           Yes      Yes                                 +---------+---------------+---------+-----------+----------+--------------+ FV DistalFull           Yes      Yes                                 +---------+---------------+---------+-----------+----------+--------------+ PFV      Full                                                        +---------+---------------+---------+-----------+----------+--------------+  POP      Full           Yes      Yes                                 +---------+---------------+---------+-----------+----------+--------------+ PTV      Full                                                        +---------+---------------+---------+-----------+----------+--------------+ PERO     Full                                                        +---------+---------------+---------+-----------+----------+--------------+   +---------+---------------+---------+-----------+----------+--------------+ LEFT     CompressibilityPhasicitySpontaneityPropertiesThrombus Aging +---------+---------------+---------+-----------+----------+--------------+ CFV      Full           Yes      Yes                                 +---------+---------------+---------+-----------+----------+--------------+ SFJ      Full                                                        +---------+---------------+---------+-----------+----------+--------------+ FV Prox  Full           Yes      Yes                                 +---------+---------------+---------+-----------+----------+--------------+ FV Mid   Full           Yes      Yes                                 +---------+---------------+---------+-----------+----------+--------------+ FV DistalFull           Yes      Yes                                  +---------+---------------+---------+-----------+----------+--------------+ PFV      Full                                                        +---------+---------------+---------+-----------+----------+--------------+ POP      Full           Yes      Yes                                 +---------+---------------+---------+-----------+----------+--------------+ PTV  Full                                                        +---------+---------------+---------+-----------+----------+--------------+ PERO     Full                                                        +---------+---------------+---------+-----------+----------+--------------+     Summary: BILATERAL: - No evidence of deep vein thrombosis seen in the lower extremities, bilaterally. - No evidence of superficial venous thrombosis in the lower extremities, bilaterally. -No evidence of popliteal cyst, bilaterally. RIGHT: Subcutaneous edema in area of pop fossa and calf   *See table(s) above for measurements and observations. Electronically signed by Heath Lark on 11/29/2020 at 2:07:40 PM.    Final    Recent Labs    11/30/20 0602  WBC 9.0  HGB 8.3*  HCT 26.6*  PLT 545*    Recent Labs    11/30/20 0602  NA 135  K 3.8  CL 101  CO2 28  GLUCOSE 110*  BUN 19  CREATININE 1.05*  CALCIUM 8.8*     Intake/Output Summary (Last 24 hours) at 11/30/2020 1105 Last data filed at 11/30/2020 0700 Gross per 24 hour  Intake 716 ml  Output --  Net 716 ml     Pressure Injury 11/23/20 Coccyx Mid Stage 2 -  Partial thickness loss of dermis presenting as a shallow open injury with a red, pink wound bed without slough. almost resolved (Active)  11/23/20 1836  Location: Coccyx  Location Orientation: Mid  Staging: Stage 2 -  Partial thickness loss of dermis presenting as a shallow open injury with a red, pink wound bed without slough.  Wound Description (Comments): almost resolved  Present on Admission: Yes     Physical Exam: Vital Signs Blood pressure 119/77, pulse 93, temperature 98.4 F (36.9 C), temperature source Oral, resp. rate 17, height 5\' 3"  (1.6 m), weight 55.3 kg, SpO2 96 %.    General: awake, alert, appropriate, laying on L side in bed; NAD HENT: conjugate gaze; oropharynx moist CV: regular rate; no JVD Pulmonary: CTA B/L; no W/R/R- good air movement GI: soft, NT, ND, (+)BS; hypoactive Psychiatric: appropriate; HOH Neurological: alert  Skin: right hip wound remains cdi. Sacrum with foam dressing, C/D/I- abrasion right knee Neuro:  Alert and oriented x 3. Normal insight and awareness. Intact Memory. Normal language and speech. Cranial nerve exam unremarkable. Motor and sensory without focal deficits other than pain inhibition. Normal reflexes Musculoskeletal: right hip tender to palpation. Able to tolerate wb. Minimal calf tenderness with palpation   Assessment/Plan: 1. Functional deficits which require 3+ hours per day of interdisciplinary therapy in a comprehensive inpatient rehab setting. Physiatrist is providing close team supervision and 24 hour management of active medical problems listed below. Physiatrist and rehab team continue to assess barriers to discharge/monitor patient progress toward functional and medical goals  Care Tool:  Bathing    Body parts bathed by patient: Right arm, Left arm, Chest, Abdomen, Front perineal area, Buttocks, Right upper leg, Left upper leg, Left lower leg, Face, Right lower leg   Body parts bathed by helper: Right lower leg  Bathing assist Assist Level: Supervision/Verbal cueing (using LH sponge as needed)     Upper Body Dressing/Undressing Upper body dressing   What is the patient wearing?: Pull over shirt    Upper body assist Assist Level: Set up assist    Lower Body Dressing/Undressing Lower body dressing      What is the patient wearing?: Underwear/pull up, Pants     Lower body assist Assist for lower body  dressing: Contact Guard/Touching assist     Toileting Toileting    Toileting assist Assist for toileting: Minimal Assistance - Patient > 75%     Transfers Chair/bed transfer  Transfers assist     Chair/bed transfer assist level: Contact Guard/Touching assist Chair/bed transfer assistive device: Geologist, engineering   Ambulation assist      Assist level: Contact Guard/Touching assist Assistive device: Walker-rolling Max distance: 136 ft   Walk 10 feet activity   Assist     Assist level: Contact Guard/Touching assist Assistive device: Walker-rolling   Walk 50 feet activity   Assist    Assist level: Contact Guard/Touching assist Assistive device: Walker-rolling    Walk 150 feet activity   Assist Walk 150 feet activity did not occur: Safety/medical concerns  Assist level: Contact Guard/Touching assist Assistive device: Walker-rolling    Walk 10 feet on uneven surface  activity   Assist Walk 10 feet on uneven surfaces activity did not occur: Safety/medical concerns         Wheelchair     Assist Will patient use wheelchair at discharge?: No             Wheelchair 50 feet with 2 turns activity    Assist            Wheelchair 150 feet activity     Assist          Blood pressure 119/77, pulse 93, temperature 98.4 F (36.9 C), temperature source Oral, resp. rate 17, height 5\' 3"  (1.6 m), weight 55.3 kg, SpO2 96 %.  Medical Problem List and Plan: 1.  Functional and mobility deficits secondary to right femoral neck fx after fall 7/20. Right hip hemi performed 7/28             -WBAT, posterior hip precautions             -patient may shower             -ELOS/Goals: 8/11, supervision with PT/OT  -Con't CIR PT and OT  2.  Antithrombotics: -DVT/anticoagulation:  Pharmaceutical: Other (comment)--Savayas daily -LE venous dopplers negative---d/w patient today             -antiplatelet therapy: N/A 3. Pain  Management: Hydrocodone prn.   -heat, tylenol, elevation prn 4. Mood: LCSW to follow for evaluation and support.              -antipsychotic agents: N/A 5. Neuropsych: This patient is capable of making decisions on her own behalf. 6. Skin/Wound Care: Routine pressure relief measures. Daily dressing changes to right hip -pressure relief, foam dressing to coccyx   -encourage PO 8/8- will reassess coccyx in AM 7. Fluids/Electrolytes/Nutrition: encourage fluids                 8. A fib: Monitor HR TID--continue endoxaban daily --bisoprolol and Cardizem bid. 9. HTN: Monitor BP tid--watch for orthostatic changes. 10.  T2DM: Hgb A1C-6.2--was on metformin PTA. --will monitor BS ac/hs and use SSI --Well Controlled despite holding metformin  CBG (last 3)  Recent Labs    11/29/20 1705 11/29/20 2114 11/30/20 0616  GLUCAP 120* 148* 108*    8/8- BG's well controlled- con't regimen 11. Leucocytosis: Trending down 14.4, monitor for fevers and other signs of infection.   8/8- WBC 9.0- - much better- con't to monitor 12. ABLA: hgb holding at 9.4 -stable  8/8- Hb down to 8.3- will recheck Thursday and if still going down, will do further work up 13. Acute renal failure: Encourage fluid intake-              -BUN/CR improved to 23/0.95             -push fluids, recheck Monday  8/8- BUN stable at 19 but Cr up slightly to 1.05- will recheck Thursday.  14. COPD: scheduled qvar, proventil prn. May use with therapy for exercise induced symptoms    LOS: 7 days A FACE TO FACE EVALUATION WAS PERFORMED  Tajai Suder 11/30/2020, 11:05 AM

## 2020-11-30 NOTE — Progress Notes (Signed)
Physical Therapy Session Note  Patient Details  Name: Angelica Ferguson MRN: [id number] Date of Birth: 1933-01-04  Today's Date: 11/30/2020 PT Individual Time: 1100-1145 PT Individual Time Calculation (min): 45 min   Short Term Goals: Week 1:  PT Short Term Goal 1 (Week 1): =LTG due to ELOS Week 2:    Week 3:     Skilled Therapeutic Interventions/Progress Updates:    Pt initially supine w/daughter at bedside.  Both very receptive to family training.  Pt supine to sit and stand pivot transfer to wc w/cga w/RW. Transported to gym for continued training. Stairs: Reviewed sequencing, pt and daughter able to recall this from previous session.  Pt ascended/descended 4 stairs w/2 rails w/cga only, daughter observed guarding.  Daughter then practiced guarding technique w/therapist posing as pt, performs w/cues only. Daughter then demonstrated safe execution of guarding her mother as she ascended/descended stairs x 4 w/2 rails, cga of daughter.  Car transfer: discussed safe technique for performing transfer and for guarding w/transfer.  Pt then transferred wc to car and car to wc w/daughter providing guarding assist and use of RW.  Performed safely and w/good adherence to precautions.  Daughter and pt able to recall precautions and rationale for precautions.     Pt transferred wc to recliner w/RW and cga.  Sat on pillow for comfort, stated current cushion not comfortable in recliner.   Therapist then reviewed process for folding and loading of wc into and out of car.  Also discussed need for pressure relieving wc cushion due to existing pressure injury.  Placed roho and [place] cushions in room for pt to trial during upcoming sessions.  Daughter agreed to measure current wc for assistance in ordering proper siize cushion.  Will decide on best option after pt trials both options.   Pt left oob in recliner w/chair alarm set and needs in reach.   Therapy Documentation Precautions:   Precautions Precautions: Posterior Hip, Fall Precaution Comments: pt able to recall 3/3 precautions Restrictions Weight Bearing Restrictions: Yes RLE Weight Bearing: Weight bearing as tolerated    Therapy/Group: Individual Therapy Rada Hay, PT   [name] 11/30/2020, 6:13 PM

## 2020-11-30 NOTE — Progress Notes (Signed)
Occupational Therapy Session Note  Patient Details  Name: Angelica Ferguson MRN: [id number] Date of Birth: 10-Dec-1932  Today's Date: 11/30/2020 OT Group Time: 1335-1430 OT Group Time Calculation (min): 55 min   Short Term Goals: Week 1:  OT Short Term Goal 1 (Week 1): STGs = LTGs d/t ELOS at Supervision  Skilled Therapeutic Interventions/Progress Updates:  Pt participated in group session with a focus on stress mgmt, education on healthy coping strategies, and social interaction.  Session focus on breaking down stressors into "daily hassles," "major life stressors" and "life circumstances" in effort to allow pts to chunk their stressors into groups and rank them on a scale of 1-10. Pt actively sharing stressors and contributing to group conversation. Offered education on factors that protect [place] against stress such as "daily uplifts," "healthy coping strategies" and "protective factors." Encouraged all group members to make an effort to actively recall one event from their day that was a daily uplift in an effort to protect their mindset from stressors. Issued pt handouts on healthy coping strategies to implement into routine. Ended session by playing a game of "selfcare bingo" to captilize on all of the positive self care tasks that the pt is able to do. Pts daughter accompanied her to group session with pts daughter assisting by relaying information to pt as pt is hard of hearing. Pt transported back to room by RT.  Therapy Documentation Precautions:  Precautions Precautions: Posterior Hip, Fall Precaution Comments: pt able to recall 3/3 precautions Restrictions Weight Bearing Restrictions: Yes RLE Weight Bearing: Weight bearing as tolerated  Pain: Pt reports no pain during session.    Therapy/Group: Group Therapy  Barron Schmid 11/30/2020, 3:56 PM

## 2020-12-01 DIAGNOSIS — S72001S Fracture of unspecified part of neck of right femur, sequela: Secondary | ICD-10-CM | POA: Diagnosis not present

## 2020-12-01 LAB — GLUCOSE, CAPILLARY
Glucose-Capillary: 97 mg/dL (ref 70–99)
Glucose-Capillary: 113 mg/dL — ABNORMAL HIGH (ref 70–99)
Glucose-Capillary: 146 mg/dL — ABNORMAL HIGH (ref 70–99)
Glucose-Capillary: 121 mg/dL — ABNORMAL HIGH (ref 70–99)

## 2020-12-01 NOTE — Progress Notes (Signed)
Occupational Therapy Session Note  Patient Details  Name: Angelica Ferguson MRN: [id number] Date of Birth: [birth year]  Today's Date: 12/01/2020 OT Individual Time: 3300-7622 OT Individual Time Calculation (min): 56 min    Short Term Goals: Week 1:  OT Short Term Goal 1 (Week 1): STGs = LTGs d/t ELOS at Supervision   Skilled Therapeutic Interventions/Progress Updates:    Pt greeted at time of session sitting up in wheelchair with RN finishing med pass and daughter present in room, remained throughout session. No pain reported. Politely declined all ADL as daughter had already helped the pt with this today. Wheelchair transport room <> gym total A for time, focused on standing balance and bending/reaching, maneuvering RW in small spaces all with close supervision/CGA for 4 rounds of dynamic standing at large connect four board. Also elevated connect four to mat surface for increased reach/balance challenge. Ambulating around day room with CGA/close supervision as well throughout. Nustep on level 3 for 8 minutes for BUE/BLE strengthening and global endurance training with cues to maintain hip precautions initially and pt able to follow. Back in room stand pivot to recliner, call bell in reach.   Therapy Documentation Precautions:  Precautions Precautions: Posterior Hip, Fall Precaution Comments: pt able to recall 3/3 precautions Restrictions Weight Bearing Restrictions: No RLE Weight Bearing: Weight bearing as tolerated      Therapy/Group: Individual Therapy  [name] 12/01/2020, 7:19 AM

## 2020-12-01 NOTE — Progress Notes (Signed)
PROGRESS NOTE   Subjective/Complaints:  Pt peeing on toilet with daughter in room- trained for transfers- pt's "bum" hurts- poor sleep but didn't take trazodone.   Will con't trazodone at d/c per daughter request.     ROS:   Pt denies SOB, abd pain, CP, N/V/C/D, and vision changes    Objective:   No results found. Recent Labs    11/30/20 0602  WBC 9.0  HGB 8.3*  HCT 26.6*  PLT 545*    Recent Labs    11/30/20 0602  NA 135  K 3.8  CL 101  CO2 28  GLUCOSE 110*  BUN 19  CREATININE 1.05*  CALCIUM 8.8*     Intake/Output Summary (Last 24 hours) at 12/01/2020 1047 Last data filed at 12/01/2020 0700 Gross per 24 hour  Intake 957 ml  Output --  Net 957 ml     Pressure Injury 11/23/20 Coccyx Mid Stage 2 -  Partial thickness loss of dermis presenting as a shallow open injury with a red, pink wound bed without slough. almost resolved (Active)  11/23/20 1836  Location: Coccyx  Location Orientation: Mid  Staging: Stage 2 -  Partial thickness loss of dermis presenting as a shallow open injury with a red, pink wound bed without slough.  Wound Description (Comments): almost resolved  Present on Admission: Yes    Physical Exam: Vital Signs Blood pressure 124/73, pulse 64, temperature 97.6 F (36.4 C), resp. rate 20, height 5\' 3"  (1.6 m), weight 55.3 kg, SpO2 96 %.     General: awake, alert, appropriate sitting on toilet- transferred off with RW and daughter; , NAD HENT: conjugate gaze; oropharynx moist CV: regular rate; no JVD Pulmonary: CTA B/L; no W/R/R- good air movement GI: soft, NT, ND, (+)BS Psychiatric: appropriate; sweet; interactive Neurological: Ox3  Skin: R hip looks great- staples intact; looks great-  Sacrum with foam dressing, C/D/I- abrasion right knee Neuro:  Alert and oriented x 3. Normal insight and awareness. Intact Memory. Normal language and speech. Cranial nerve exam unremarkable.  Motor and sensory without focal deficits other than pain inhibition. Normal reflexes Musculoskeletal: right hip tender to palpation. Able to tolerate wb. Minimal calf tenderness with palpation   Assessment/Plan: 1. Functional deficits which require 3+ hours per day of interdisciplinary therapy in a comprehensive inpatient rehab setting. Physiatrist is providing close team supervision and 24 hour management of active medical problems listed below. Physiatrist and rehab team continue to assess barriers to discharge/monitor patient progress toward functional and medical goals  Care Tool:  Bathing    Body parts bathed by patient: Right arm, Left arm, Chest, Abdomen, Front perineal area, Buttocks, Right upper leg, Left upper leg, Left lower leg, Face, Right lower leg   Body parts bathed by helper: Right lower leg     Bathing assist Assist Level: Supervision/Verbal cueing (using LH sponge as needed)     Upper Body Dressing/Undressing Upper body dressing   What is the patient wearing?: Pull over shirt    Upper body assist Assist Level: Set up assist    Lower Body Dressing/Undressing Lower body dressing      What is the patient wearing?: Underwear/pull up, Pants  Lower body assist Assist for lower body dressing: Contact Guard/Touching assist     Toileting Toileting    Toileting assist Assist for toileting: Minimal Assistance - Patient > 75%     Transfers Chair/bed transfer  Transfers assist     Chair/bed transfer assist level: Contact Guard/Touching assist Chair/bed transfer assistive device: Geologist, engineering   Ambulation assist      Assist level: Contact Guard/Touching assist Assistive device: Walker-rolling Max distance: 136 ft   Walk 10 feet activity   Assist     Assist level: Contact Guard/Touching assist Assistive device: Walker-rolling   Walk 50 feet activity   Assist    Assist level: Contact Guard/Touching assist Assistive  device: Walker-rolling    Walk 150 feet activity   Assist Walk 150 feet activity did not occur: Safety/medical concerns  Assist level: Contact Guard/Touching assist Assistive device: Walker-rolling    Walk 10 feet on uneven surface  activity   Assist Walk 10 feet on uneven surfaces activity did not occur: Safety/medical concerns         Wheelchair     Assist Will patient use wheelchair at discharge?: No             Wheelchair 50 feet with 2 turns activity    Assist            Wheelchair 150 feet activity     Assist          Blood pressure 124/73, pulse 64, temperature 97.6 F (36.4 C), resp. rate 20, height 5\' 3"  (1.6 m), weight 55.3 kg, SpO2 96 %.  Medical Problem List and Plan: 1.  Functional and mobility deficits secondary to right femoral neck fx after fall 7/20. Right hip hemi performed 7/28             -WBAT, posterior hip precautions             -patient may shower             -ELOS/Goals: 8/11, supervision with PT/OT  -con't CIR PT and OT- will d/c staples on 8/10- and place steristrips.  2.  Antithrombotics: -DVT/anticoagulation:  Pharmaceutical: Other (comment)--Savayas daily -LE venous dopplers negative---d/w patient today             -antiplatelet therapy: N/A 3. Pain Management: Hydrocodone prn.   -heat, tylenol, elevation prn  8/9- not taking hydrocodone- just tylenol- denies pain- con't regimen prn 4. Mood: LCSW to follow for evaluation and support.              -antipsychotic agents: N/A 5. Neuropsych: This patient is capable of making decisions on her own behalf. 6. Skin/Wound Care: Routine pressure relief measures. Daily dressing changes to right hip -pressure relief, foam dressing to coccyx   -encourage PO 8/8- will reassess coccyx in AM  8/9- looking well- granulating 7. Fluids/Electrolytes/Nutrition: encourage fluids                 8. A fib: Monitor HR TID--continue endoxaban daily --bisoprolol and Cardizem  bid. 9. HTN: Monitor BP tid--watch for orthostatic changes. 10.  T2DM: Hgb A1C-6.2--was on metformin PTA. --will monitor BS ac/hs and use SSI --Well Controlled despite holding metformin  CBG (last 3)  Recent Labs    11/30/20 1627 11/30/20 2107 12/01/20 0625  GLUCAP 117* 148* 97    8/9- BG's controlled- con't regimen 11. Leucocytosis: Trending down 14.4, monitor for fevers and other signs of infection.   8/8- WBC 9.0- - much better-  con't to monitor 12. ABLA: hgb holding at 9.4 -stable  8/8- Hb down to 8.3- will recheck Thursday and if still going down, will do further work up 13. Acute renal failure: Encourage fluid intake-              -BUN/CR improved to 23/0.95             -push fluids, recheck Monday  8/8- BUN stable at 19 but Cr up slightly to 1.05- will recheck Thursday.  14. COPD: scheduled qvar, proventil prn. May use with therapy for exercise induced symptoms 15. Dispo  8/9- d/c Thursday- and needs Rx for trazodone 25-50 mg nightly as needed at d/c. Per pt/ daughter request.     LOS: 8 days A FACE TO FACE EVALUATION WAS PERFORMED  Angelica Ferguson 12/01/2020, 10:47 AM

## 2020-12-01 NOTE — Progress Notes (Signed)
Patient ID: Angelica Ferguson, female   DOB: [birth year], 87 y.o.   MRN: [id number]  SW sent charity HHPT/OT/SN referral to [facility] and waiting on follow-up.  SW met with pt and pt dtr Angelica Ferguson in room to provide updates from team conference on gains made, and d/c date remains 8/11. No DME recommended. SW informed on above about charity HH and waiting on updates.   *pt accepted for HHPT/OT/SN charity [facility] referral with Meredeth Ide [facility].  Loralee Pacas, MSW, [place] Office: [phone] Cell: [phone] Fax: [phone]

## 2020-12-01 NOTE — Progress Notes (Signed)
Occupational Therapy Session Note  Patient Details  Name: Angelica Ferguson MRN: [id number] Date of Birth: October 08, 1932  Today's Date: 12/01/2020 OT Individual Time: 1355-1450 OT Individual Time Calculation (min): 55 min    Short Term Goals: Week 1:  OT Short Term Goal 1 (Week 1): STGs = LTGs d/t ELOS at Supervision  Skilled Therapeutic Interventions/Progress Updates:    Patient in bed, alert and ready for therapy session.  She denies pain at this time.  Supine to sitting edge of bed with CS.  Sit to stand and ambulation with RW to w/c CS.   Reviewed and practiced reach and transport of items in kitchen area with safe walker technique and following THPs - she is able to retrieve items in cabinets, drawers, microwave, refrigerator with CS and min cues for hand and walker placement.   Completed nustep 2 x 5 minutes  Ambulated with RW to commode, sink and bed with CS.  Completed toileting, hand hygiene with CS.  She returned to bed level at close of session, bed alarm set and call bell in hand.    Therapy Documentation Precautions:  Precautions Precautions: Posterior Hip, Fall Precaution Comments: pt able to recall 3/3 precautions Restrictions Weight Bearing Restrictions: Yes RLE Weight Bearing: Weight bearing as tolerated  Therapy/Group: Individual Therapy  Barrie Lyme 12/01/2020, 7:48 AM

## 2020-12-01 NOTE — Progress Notes (Signed)
Physical Therapy Session Note  Patient Details  Name: Angelica Ferguson MRN: [id number] Date of Birth: 07-30-1932  Today's Date: 12/01/2020 PT Individual Time: 0800-0900 PT Individual Time Calculation (min): 60 min   Short Term Goals: Week 1:  PT Short Term Goal 1 (Week 1): =LTG due to ELOS Week 2:    Week 3:     Skilled Therapeutic Interventions/Progress Updates:   Pain:  Pt reports L hip pain 3/10 but feels it is "getting more sore" following session.  Nursing notified pt requesting Tylenol.  Treatment to tolerance.  Rest breaks and repositioning as needed.  Pt initially oob in recliner in pajamas and agreeable to treatment session w/focus on family education, HEP, seating options, and gait.  Daughter assisted pt w/dressing and ADLs at sink with no assist required from PT as PT set up HEP.  Pt and daughter instructed w/HEP via [employer] see below.  Link sent to daughter who confirmed receipt.  Using my computer -   Using video instruction and therapist instruction, pt performed full HEP, daughter also followed along and provided cues for mom.  Access Code: [email or web address] URL: [email or web address] Date: 12/01/2020 Prepared by: Rada Hay  Exercises Seated Toe Raise - 1 x daily - 7 x weekly - 3 sets - 10 reps Seated Ankle Circles - 1 x daily - 7 x weekly - 3 sets - 10 reps Seated Long Arc Quad - 1 x daily - 7 x weekly - 3 sets - 10 reps Seated Hip Adduction Isometrics with Ball - 1 x daily - 7 x weekly - 3 sets - 10 reps Seated Heel Raise - 1 x daily - 7 x weekly - 3 sets - 10 reps Standing Hip Abduction with Counter Support - 1 x daily - 7 x weekly - 3 sets - 10 reps Standing Hip Extension with Counter Support - 1 x daily - 7 x weekly - 3 sets - 10 reps Standing March with Counter Support - 1 x daily - 7 x weekly - 3 sets - 10 reps Repeated Sit to stand to RW.  Pt rested in sitting on Jay 2 cushion and expressed comfort on this. Did not like roho.  Discussed pressure  relief provided by cushion and importance w/prexisting pressure sore.  Also discussed measurements and access on [employer]/online for ordering.  Daughter agreeable w/this option.  Gait 175 ft w/RW, cga, improved step thru gait w/mild antalic pattern and improving cadence and fluidity of gait.  Pt left oob in wc w/daughter at side and needs in reach.   Therapy Documentation Precautions:  Precautions Precautions: Posterior Hip, Fall Precaution Comments: pt able to recall 3/3 precautions Restrictions Weight Bearing Restrictions: Yes RLE Weight Bearing: Weight bearing as tolerated    Therapy/Group: Individual Therapy Rada Hay, PT   [name] 12/01/2020, 12:32 PM

## 2020-12-02 ENCOUNTER — Other Ambulatory Visit (HOSPITAL_COMMUNITY): Payer: Self-pay

## 2020-12-02 DIAGNOSIS — S72001S Fracture of unspecified part of neck of right femur, sequela: Secondary | ICD-10-CM | POA: Diagnosis not present

## 2020-12-02 LAB — GLUCOSE, CAPILLARY
Glucose-Capillary: 92 mg/dL (ref 70–99)
Glucose-Capillary: 117 mg/dL — ABNORMAL HIGH (ref 70–99)
Glucose-Capillary: 136 mg/dL — ABNORMAL HIGH (ref 70–99)
Glucose-Capillary: 108 mg/dL — ABNORMAL HIGH (ref 70–99)

## 2020-12-02 MED ORDER — ATORVASTATIN CALCIUM 80 MG PO TABS
80.0000 mg | ORAL_TABLET | Freq: Every evening | ORAL | 0 refills | Status: DC
  Filled 2020-12-02: qty 30, 30d supply, fill #0

## 2020-12-02 MED ORDER — TRAMADOL HCL 50 MG PO TABS
50.0000 mg | ORAL_TABLET | Freq: Four times a day (QID) | ORAL | 0 refills | Status: DC | PRN
  Filled 2020-12-02: qty 30, 8d supply, fill #0

## 2020-12-02 MED ORDER — EDOXABAN TOSYLATE 30 MG PO TABS
30.0000 mg | ORAL_TABLET | Freq: Every day | ORAL | 0 refills | Status: DC
  Filled 2020-12-02: qty 30, 30d supply, fill #0

## 2020-12-02 MED ORDER — SENNOSIDES-DOCUSATE SODIUM 8.6-50 MG PO TABS
2.0000 | ORAL_TABLET | Freq: Every day | ORAL | 0 refills | Status: DC
  Filled 2020-12-02: qty 60, 30d supply, fill #0

## 2020-12-02 MED ORDER — POLYETHYLENE GLYCOL 3350 17 GM/SCOOP PO POWD
17.0000 g | Freq: Every day | ORAL | 0 refills | Status: DC
  Filled 2020-12-02: qty 510, 30d supply, fill #0

## 2020-12-02 MED ORDER — BISOPROLOL FUMARATE 5 MG PO TABS
5.0000 mg | ORAL_TABLET | Freq: Two times a day (BID) | ORAL | 0 refills | Status: DC
  Filled 2020-12-02: qty 60, 30d supply, fill #0

## 2020-12-02 MED ORDER — DILTIAZEM HCL 60 MG PO TABS
60.0000 mg | ORAL_TABLET | Freq: Two times a day (BID) | ORAL | 0 refills | Status: DC
  Filled 2020-12-02: qty 60, 30d supply, fill #0

## 2020-12-02 MED ORDER — QVAR REDIHALER 80 MCG/ACT IN AERB
2.0000 | INHALATION_SPRAY | Freq: Two times a day (BID) | RESPIRATORY_TRACT | 0 refills | Status: DC
  Filled 2020-12-02: qty 10.6, 30d supply, fill #0

## 2020-12-02 MED ORDER — TRAZODONE HCL 50 MG PO TABS
25.0000 mg | ORAL_TABLET | Freq: Every evening | ORAL | 0 refills | Status: DC | PRN
  Filled 2020-12-02: qty 15, 15d supply, fill #0

## 2020-12-02 NOTE — Patient Care Conference (Signed)
Inpatient RehabilitationTeam Conference and Plan of Care Update Date: 12/01/2020   Time: 11:00 AM    Patient Name: Angelica Ferguson      Medical Record Number: [id number]  Date of Birth: 08/21/1932 Sex: Female         Room/Bed: 0C14G/8J85U-31 Payor Info: Payor: /    Admit Date/Time:  11/23/2020  5:04 PM  Primary Diagnosis:  Closed fracture of right hip requiring operative repair, sequela  Hospital Problems: Principal Problem:   Closed fracture of right hip requiring operative repair, sequela Active Problems:   Pressure injury of skin    Expected Discharge Date: Expected Discharge Date: 12/03/20  Team Members Present: Physician leading conference: Dr. Genice Rouge Social Worker Present: Cecile Sheerer, LCSWA Nurse Present: Kennyth Arnold, RN PT Present: Rada Hay, PT OT Present: Towanda Malkin, OT PPS Coordinator present : Fae Pippin, SLP     Current Status/Progress Goal Weekly Team Focus  Bowel/Bladder   Pt continent of B/B. LBM 11/29/2020  Regular BM every 3 days or less.  Assess B/B every shift and PRN   Swallow/Nutrition/ Hydration             ADL's   CS/min A for bathing/dressing with occ cues for hip precautions and safe technique, funcitonal transfers with RW/DME CS  Supervision  patient/family education, discharge prep, adl and transfer training   Mobility   cga w/ rw w all  Supervision overall  HEP, seating, gait, stairs, family training   Communication             Safety/Cognition/ Behavioral Observations            Pain   Pt intermittently rates pain 5/10 to right hip and buttocks. Tylenol is effective for pain  Pain rating consistently <3/10  Assess pain every shift and PRN   Skin   right hip surgical incision, right knee and right ankle abrasian, healing stage 2 to coccyx  no new breakdown. No S/Sx of infection  Assess skin every shift and PRN     Discharge Planning:  Pt is uninsured and a [employer] with plans to move here  permanently. Pt lives with her dtr and her partner. Home is equipped to handle her care needs.   Team Discussion: Bum is sore, healing stage 2 on coccyx, right hip looks great, staples to come out tomorrow. No pain reported. Please encourage fluids. Continent B/B, DM managed. Educating on dressing changes and readiness for discharge. PT gave HEP, contact guard with everything. W/C at home, daughter will order pressure relief cushion. Contact guard functional transfers, needs on-going encouragement. Patient on target to meet rehab goals: yes  *See Care Plan and progress notes for long and short-term goals.   Revisions to Treatment Plan:  Not at this time.  Teaching Needs: Family education, medication management, pain management, skin/wound care, transfer training, gait training, balance training, endurance training, stair training, safety awareness.  Current Barriers to Discharge: Decreased caregiver support, Medical stability, Home enviroment access/layout, Wound care, Lack of/limited family support, Weight bearing restrictions, and Medication compliance  Possible Resolutions to Barriers: Continue current medications, provide emotional support.     Medical Summary Current Status: Wants trazodone at d/c for sleep; continent of B/B- no pain- foam over stage II; BG's controlled overall; educating on wound care and d/c planning  Barriers to Discharge: Home enviroment access/layout;Weight bearing restrictions;Weight;Wound care  Barriers to Discharge Comments: 24/7 care from daughter- got HEP- family training done Possible Resolutions to [employer] Focus: educating on wound care/stage II  on coccyx; CGA for transfers/CGA for ADLs;  d/c 8/11   Continued Need for Acute Rehabilitation Level of Care: The patient requires daily medical management by a physician with specialized training in physical medicine and rehabilitation for the following reasons: Direction of a multidisciplinary physical  rehabilitation program to maximize functional independence : Yes Medical management of patient stability for increased activity during participation in an intensive rehabilitation regime.: Yes Analysis of laboratory values and/or radiology reports with any subsequent need for medication adjustment and/or medical intervention. : Yes   I attest that I was present, lead the team conference, and concur with the assessment and plan of the team.   Tennis Must 12/02/2020, 2:26 PM

## 2020-12-02 NOTE — Progress Notes (Signed)
Occupational Therapy Session Note  Patient Details  Name: Angelica Ferguson MRN: [id number] Date of Birth: Feb 19, 1933  Today's Date: 12/02/2020 OT Individual Time: 5170-0174 OT Individual Time Calculation (min): 70 min    Short Term Goals: Week 1:  OT Short Term Goal 1 (Week 1): STGs = LTGs d/t ELOS at Supervision  Skilled Therapeutic Interventions/Progress Updates:    OT intervention with focus on bed mobility, functional transfers/amb with RW, standing balance, activity tolerance, safety awareness, and educaiton to prepare for discharge tomorrow. Pt's daughter present throughout therapy and provides the appropriate level of supervision/assistance. Pt completed bathing/dressing with supervision using AE sit<>stand at sink. Pt amb to day room and engaged in NuStep level 3 for 7 mins. Pt engaged in standing activity including corn hole X 2. Following rest, pt amb with RW back to room. Pt and daughter pleased with progress and ready for discharge home tomorrow.   Therapy Documentation Precautions:  Precautions Precautions: Posterior Hip, Fall Precaution Comments: pt able to recall 3/3 precautions Restrictions Weight Bearing Restrictions: Yes RLE Weight Bearing: Weight bearing as tolerated Pain:  Pt denies pain this morning   Therapy/Group: Individual Therapy  Rich Brave 12/02/2020, 9:35 AM

## 2020-12-02 NOTE — Progress Notes (Signed)
PROGRESS NOTE   Subjective/Complaints:  Pt reports slept better with scheduled Trazodone-  Ready to get staples out.   Pt has no other issues- no pain; and LBM yesterday.    ROS:   Pt denies SOB, abd pain, CP, N/V/C/D, and vision changes     Objective:   No results found. Recent Labs    11/30/20 0602  WBC 9.0  HGB 8.3*  HCT 26.6*  PLT 545*    Recent Labs    11/30/20 0602  NA 135  K 3.8  CL 101  CO2 28  GLUCOSE 110*  BUN 19  CREATININE 1.05*  CALCIUM 8.8*     Intake/Output Summary (Last 24 hours) at 12/02/2020 0845 Last data filed at 12/01/2020 1700 Gross per 24 hour  Intake 240 ml  Output --  Net 240 ml     Pressure Injury 11/23/20 Coccyx Mid Stage 2 -  Partial thickness loss of dermis presenting as a shallow open injury with a red, pink wound bed without slough. almost resolved (Active)  11/23/20 1836  Location: Coccyx  Location Orientation: Mid  Staging: Stage 2 -  Partial thickness loss of dermis presenting as a shallow open injury with a red, pink wound bed without slough.  Wound Description (Comments): almost resolved  Present on Admission: Yes    Physical Exam: Vital Signs Blood pressure 122/68, pulse 88, temperature 97.9 F (36.6 C), temperature source Oral, resp. rate 18, height 5\' 3"  (1.6 m), weight 55.3 kg, SpO2 95 %.      General: awake, alert, appropriate, frail appearing, but bright; laying supine in bed; NAD HENT: conjugate gaze; oropharynx moist CV: regular rate; no JVD Pulmonary: CTA B/L; no W/R/R- good air movement GI: soft, NT, ND, (+)BS Psychiatric: appropriate; bright, sweet Neurological: alert; HOH  Skin: R hip looks great- staples intact; looks great-  Sacrum with foam dressing, C/D/I- abrasion right knee Neuro:  Alert and oriented x 3. Normal insight and awareness. Intact Memory. Normal language and speech. Cranial nerve exam unremarkable. Motor and sensory  without focal deficits other than pain inhibition. Normal reflexes Musculoskeletal: right hip tender to palpation. Able to tolerate wb. Minimal calf tenderness with palpation   Assessment/Plan: 1. Functional deficits which require 3+ hours per day of interdisciplinary therapy in a comprehensive inpatient rehab setting. Physiatrist is providing close team supervision and 24 hour management of active medical problems listed below. Physiatrist and rehab team continue to assess barriers to discharge/monitor patient progress toward functional and medical goals  Care Tool:  Bathing    Body parts bathed by patient: Right arm, Left arm, Chest, Abdomen, Front perineal area, Buttocks, Right upper leg, Left upper leg, Left lower leg, Face, Right lower leg   Body parts bathed by helper: Right lower leg     Bathing assist Assist Level: Supervision/Verbal cueing (using LH sponge as needed)     Upper Body Dressing/Undressing Upper body dressing   What is the patient wearing?: Pull over shirt    Upper body assist Assist Level: Set up assist    Lower Body Dressing/Undressing Lower body dressing      What is the patient wearing?: Underwear/pull up, Pants  Lower body assist Assist for lower body dressing: Contact Guard/Touching assist     Toileting Toileting    Toileting assist Assist for toileting: Supervision/Verbal cueing     Transfers Chair/bed transfer  Transfers assist     Chair/bed transfer assist level: Contact Guard/Touching assist Chair/bed transfer assistive device: Geologist, engineering   Ambulation assist      Assist level: Contact Guard/Touching assist Assistive device: Walker-rolling Max distance: 160   Walk 10 feet activity   Assist     Assist level: Contact Guard/Touching assist Assistive device: Walker-rolling   Walk 50 feet activity   Assist    Assist level: Contact Guard/Touching assist Assistive device: Walker-rolling     Walk 150 feet activity   Assist Walk 150 feet activity did not occur: Safety/medical concerns  Assist level: Contact Guard/Touching assist Assistive device: Walker-rolling    Walk 10 feet on uneven surface  activity   Assist Walk 10 feet on uneven surfaces activity did not occur: Safety/medical concerns         Wheelchair     Assist Will patient use wheelchair at discharge?: No             Wheelchair 50 feet with 2 turns activity    Assist            Wheelchair 150 feet activity     Assist          Blood pressure 122/68, pulse 88, temperature 97.9 F (36.6 C), temperature source Oral, resp. rate 18, height 5\' 3"  (1.6 m), weight 55.3 kg, SpO2 95 %.  Medical Problem List and Plan: 1.  Functional and mobility deficits secondary to right femoral neck fx after fall 7/20. Right hip hemi performed 7/28             -WBAT, posterior hip precautions             -patient may shower             -ELOS/Goals: 8/11, supervision with PT/OT  -con't PT and OT- placed order to d/c staples/sutures and place steristrips.  2.  Antithrombotics: -DVT/anticoagulation:  Pharmaceutical: Other (comment)--Savayas daily -LE venous dopplers negative---d/w patient today             -antiplatelet therapy: N/A 3. Pain Management: Hydrocodone prn.   -heat, tylenol, elevation prn  8/9- not taking hydrocodone- just tylenol- denies pain- con't regimen prn 4. Mood: LCSW to follow for evaluation and support.              -antipsychotic agents: N/A 5. Neuropsych: This patient is capable of making decisions on her own behalf. 6. Skin/Wound Care: Routine pressure relief measures. Daily dressing changes to right hip -pressure relief, foam dressing to coccyx   -encourage PO 8/8- will reassess coccyx in AM  8/9- looking well- granulating 7. Fluids/Electrolytes/Nutrition: encourage fluids                 8. A fib: Monitor HR TID--continue endoxaban daily --bisoprolol and  Cardizem bid. 9. HTN: Monitor BP tid--watch for orthostatic changes. 10.  T2DM: Hgb A1C-6.2--was on metformin PTA. --will monitor BS ac/hs and use SSI --Well Controlled despite holding metformin  CBG (last 3)  Recent Labs    12/01/20 1633 12/01/20 2109 12/02/20 0613  GLUCAP 146* 121* 92    8/9- BG's controlled- con't regimen 11. Leucocytosis: Trending down 14.4, monitor for fevers and other signs of infection.   8/8- WBC 9.0- - much better- con't to monitor  12. ABLA: hgb holding at 9.4 -stable  8/8- Hb down to 8.3- will recheck Thursday and if still going down, will do further work up 13. Acute renal failure: Encourage fluid intake-              -BUN/CR improved to 23/0.95             -push fluids, recheck Monday  8/8- BUN stable at 19 but Cr up slightly to 1.05- will recheck Thursday.   8/10- labs in AM 14. COPD: scheduled qvar, proventil prn. May use with therapy for exercise induced symptoms 15. Dispo  8/9- d/c Thursday- and needs Rx for trazodone 25-50 mg nightly as needed at d/c. Per pt/ daughter request.   8/10- removing staples today     LOS: 9 days A FACE TO FACE EVALUATION WAS PERFORMED  Delva Derden 12/02/2020, 8:45 AM

## 2020-12-02 NOTE — Progress Notes (Signed)
Occupational Therapy Session Note  Patient Details  Name: Angelica Ferguson MRN: [id number] Date of Birth: 1932/10/22  Today's Date: 12/02/2020 OT Individual Time: 1135-1210 OT Individual Time Calculation (min): 35 min    Short Term Goals: Week 1:  OT Short Term Goal 1 (Week 1): STGs = LTGs d/t ELOS at Supervision  Skilled Therapeutic Interventions/Progress Updates:    Pt received in recliner with her daughter in the room.  Pt stated she was feeling very "achy" from all of the hard work she did in previous sessions and felt too sore to do more exercises right now.  Instead used this session for education with both pt and daughter with suggestions for various issues or concerns they thought off: Sitting position with discomfort on sacrum - reviewed how to reposition self to adjust areas of pressure through buttocks - suggested pt shift hips all the way back with folded pillow at lumbar spine for increased lordosis with more pressure on upper thigh versus sacrum. Pt stated this helped.  RLE edema - reviewed use of TEDS and to keep using until diameter of R ankle is similar to L. R now it is 2 finger widths wider.  Practiced ankle pumps and circles to help with circulation. Discussed strategies of toileting if her pants fall down to her ankles and she does not have her reacher with her.  Demonstrated how to sit down and lift L leg to elevate pants then hold them with L hand when standing to adjust over hips. Also to stand up in a wider stance to avoid pants sliding down. Discussed sensitive skin concerns and suggested pt use softer TP followed by cleansing wipes. In bed, pt has difficulty with comfort due to bony structures. Recommended they try an egg crate mattress cover for pressure relief.  Tray table adjusted so pt was prepared for lunch.  All needs met.   Therapy Documentation Precautions:  Precautions Precautions: Posterior Hip, Fall Precaution Comments: pt able to recall 3/3  precautions Restrictions Weight Bearing Restrictions: Yes RLE Weight Bearing: Weight bearing as tolerated    Pain: Pain Assessment Pain Scale: 0-10 Pain Score: 8  Pain Type: Acute pain Pain Location: Hip Pain Orientation: Right Pain Descriptors / Indicators: Aching Pain Frequency: Intermittent Pain Onset: On-going Pain Intervention(s): Medication (See eMAR)   Therapy/Group: Individual Therapy  [place] 12/02/2020, 12:23 PM

## 2020-12-02 NOTE — Progress Notes (Addendum)
Patient ID: Angelica Ferguson, female   DOB: 1932/09/08, 87 y.o.   MRN: [id number]  SW left message for pt dtr [name] pt accepted under [facility], and will f/u to complete financial screen. SW encouraged follow-up if needed.   Pt setup with [facility] medication assistance program.   Cecile Sheerer, MSW, LCSWA Office: [phone] Cell: [phone] Fax: [phone]

## 2020-12-02 NOTE — Progress Notes (Signed)
Physical Therapy Discharge Summary  Patient Details  Name: Angelica Ferguson MRN: [id number] Date of Birth: 22-Jul-1932  Today's Date: 12/02/2020 PT Individual Time:  -       Patient has met 8 of 8 long term goals due to improved activity tolerance, improved balance, improved postural control, increased strength, increased range of motion, decreased pain, and functional use of  right lower extremity.  Patient to discharge at an ambulatory level Supervision.   Patient's care partner is independent to provide the necessary physical assistance at discharge.  Pt daughter has been very involved in care and has completed all training successfully.  Pt has HEP and daughter is able to access and assist pt w/this safely.  Daughter has ordered 18X16 Ulice Dash 2 cushion for existing wc to address sacral ulcer.  Pt was a pleasure to work with and is ready for dc home w/daughter from PT standpoint.  Reasons goals not met: NA  Recommendation:  Patient will be d/c home w/HEP, encourage walking w/daughter.  Equipment: No equipment provided  Daughter has ordered Ulice Dash 2 cushion for sitting to promote healing of sacral wound.  Has RW at home.    Reasons for discharge: treatment goals met  Patient/family agrees with progress made and goals achieved: Yes   Skilled Therapeutic Intervention:  aAM SESSION; Pt initially oob in recliner w/daughter at bedside.  Agreeable to session.  Exceptionally pleasant. Sit to stand, short distance gait, transfer to wc w/RW w/supervision only.  Pt transported to gym.    Car transfer:  supervision only  Stairs: ascended/descended 4 stairs w/2 rails w/supervision only Per daughter request, pt then instructed w/ascending/descending 4 stairs w/single rail.  Pt performs w/cga, some difficulty w/placing RLE w/descending due to abd weakness but able to complete.  Daughter wanted to address in event they come across this situation in community.  Pt is very active.  Bed mobility: Sit to  stand and stand pivot transfer using RW from wc to bed w/supervision.  Sit to supine on standard full sized bed in apartment independently.    Pt then performed the following therex: Hip abd/add x 20 Heel slides x 15 Clamshells x 20 Bridge x 5 Supine to sit independently.  Rolls independenty, instructed w/use of pillow between knees to prevent R hip adduction.  Pt transported to room.  Sit to stand to rw, short distance gait and transfer to bed w/supervision.  Sit to long sit in bed independently.  Pt left in supported sitting in bed (her preferred position) w/rails up x 4,bed in lowest position, and needs in reach.  PM Session: PAIN  denies pain.  Reports having issue earlier w/clothing catching in staples during toileting.  Followed up w/nursing regarding removal of staples.  Nurse stated she was planning to remove this pm.  Pt seen this pm for session w/focus on outdoor functional mobility, uneven surfaces, Sit to stand from various surfaces/bench heights. Pt initially in recliner.  Sit to stand to rw, short distance gait, transfer to wc w/supervision.  Transported pt to lobby.  Daughter realized she left inhaler in room, pt w/mild wheezing.  Therapist doubled back to retrieve and daughter administered to mother to prevent issues outdoors in heat. Pt transported outdoors.  Sit to stand from wc to walker w/supervision.   Gait training 50-120f w/RW mult trials, mult surfaces including sidewalks, brick patio, curb cut w/cga, pt noted how walker would "pull" in different directions on uneven surfaces and discussed need for increased awareness/attention to task w/pt/daughter as general rule  when navigating on uneven surfaces/unfamiliar environments.   Sit to stand from various seat heights and w/between no armrests, single armrest, and double armrests.  Pt w/increased difficulty w/no armrests, discussed being aware of situation when in community, choosing best option/positioning to optimize  safety/ease of transitions.  Daughter and pt very receptive to all training.  Daughter asked about progression from RW and discussed hx of falls, need for further assessment of balance when hip well healed, and role of OPT in evaluating this at that time, requesting referral at that time.   Pt transported to room.  Daughter able to supervise pt for transfer to bed.   Pt left supine w/rails up x 3, alarm set, bed in lowest position, and needs in reach.   PT Discharge Precautions/Restrictions Precautions Precautions: Posterior Hip;Fall Precaution Comments: pt able to recall 3/3 precautions Restrictions Weight Bearing Restrictions: Yes RLE Weight Bearing: Weight bearing as tolerated Pain Pain Assessment Pain Scale: 0-10 Pain Score: 0-No pain Pain Type: Acute pain Pain Location: Hip Pain Orientation: Right Pain Descriptors / Indicators: Aching Pain Frequency: Intermittent Pain Onset: On-going Pain Intervention(s): Medication (See eMAR) Vision/Perception    Wears glasses at home Cognition Overall Cognitive Status: Within Functional Limits for tasks assessed Arousal/Alertness: Awake/alert Attention: Focused;Sustained Focused Attention: Appears intact Sustained Attention: Appears intact Sensation Sensation Light Touch: Appears Intact Proprioception: Appears Intact Motor  Motor Motor: Abnormal postural alignment and control Motor - Discharge Observations: moves RLE in slightly guarded fashion due to mild pain  Mobility Bed Mobility Bed Mobility: Rolling Right;Rolling Left;Supine to Sit;Sit to Supine Rolling Right: Independent Rolling Left: Independent Supine to Sit: Independent Sit to Supine: Independent Transfers Transfers: Stand Pivot Transfers;Sit to Stand Sit to Stand: Supervision/Verbal cueing Stand Pivot Transfers: Supervision/Verbal cueing Stand Pivot Transfer Details (indicate cue type and reason): using RW only Transfer (Assistive device): Rolling  walker Locomotion  Gait Ambulation: Yes Gait Assistance: Supervision/Verbal cueing Gait Distance (Feet): 150 Feet Gait Assistance Details: supervision due to hx of falls, moving fluidly w/very mildly decreased stance time RLE, mildly antalgic, cadence WFL for age Gait Gait: Yes Gait Pattern: Within Functional Limits (see above) Gait velocity: WFL for age 69 / Additional Locomotion Stairs: Yes Stairs Assistance: Supervision/Verbal cueing Stair Management Technique: Two rails Number of Stairs: 4 Ramp: Contact Guard/touching assist Wheelchair Mobility Wheelchair Mobility: No  Trunk/Postural Assessment  Cervical Assessment Cervical Assessment:  (forward head) Thoracic Assessment Thoracic Assessment:  (mild kyphosis) Lumbar Assessment Lumbar Assessment:  (post pelvic tile) Postural Control Postural Control: Within Functional Limits  Balance Balance Balance Assessed: Yes Static Sitting Balance Static Sitting - Balance Support: Feet supported Static Sitting - Level of Assistance: 7: Independent Dynamic Sitting Balance Dynamic Sitting - Balance Support: During functional activity Dynamic Sitting - Level of Assistance: 5: Stand by assistance Dynamic Sitting - Balance Activities: Lateral lean/weight shifting;Forward lean/weight shifting;Reaching for objects Sitting balance - Comments: supervision Static Standing Balance Static Standing - Balance Support: During functional activity;No upper extremity supported Static Standing - Level of Assistance: 5: Stand by assistance Dynamic Standing Balance Dynamic Standing - Balance Support: Bilateral upper extremity supported;During functional activity Dynamic Standing - Level of Assistance: 5: Stand by assistance Dynamic Standing - Balance Activities: Lateral lean/weight shifting;Forward lean/weight shifting;Reaching for objects;Reaching across midline Dynamic Standing - Comments: during performance on HEP including marching, hip  abd/add, sit to stand Extremity Assessment  RUE Assessment RUE Assessment: Within Functional Limits LUE Assessment LUE Assessment: Within Functional Limits RLE Assessment RLE Assessment: Exceptions to [facility] Passive Range of Motion (PROM) Comments:  WNL adhering to hip precaution limitations Active Range of Motion (AROM) Comments: WFL adhering to hip precaution limitations General Strength Comments: see below  also:  hip abd/add 3-/5, extension 3/5 RLE Strength Right Hip Flexion: 3/5 Right Knee Flexion: 4+/5 Right Knee Extension: 4+/5 Right Ankle Dorsiflexion: 4+/5 LLE Assessment LLE Assessment: Within Functional Limits    Jerrilyn [place] 12/02/2020, 12:57 PM

## 2020-12-02 NOTE — Progress Notes (Signed)
Occupational Therapy Discharge Summary  Patient Details  Name: Angelica Ferguson MRN: [id number] Date of Birth: December 10, 1932   Patient has met 7 of 7 long term goals due to improved activity tolerance, improved balance, and ability to compensate for deficits.  Patient to discharge at overall Supervision level.  Pt made excellent progress with BADLs and functional tranfsers during this admission. Pt uses AE to complete LB bathing/dressing tasks. Pt requires min verbal cues to adhere to hip precautions. Pt completes bahting/dressing with supervision. Toilet tranfser and toileting with supervision. Pt's dauhgter has been present and provides the appropriate level of supervision assistance.Patient's care partner is independent to provide the necessary physical assistance at discharge.      Recommendation:  Patient will benefit from ongoing skilled OT services in home health setting to continue to advance functional skills in the area of BADL.  Equipment: No equipment provided  Reasons for discharge: treatment goals met  Patient/family agrees with progress made and goals achieved: Yes  OT Discharge   Vision Baseline Vision/History: No visual deficits Patient Visual Report: No change from baseline Vision Assessment?: No apparent visual deficits Perception  Perception: Within Functional Limits Praxis Praxis: Intact Cognition Overall Cognitive Status: Within Functional Limits for tasks assessed Arousal/Alertness: Awake/alert Orientation Level: Oriented X4 Attention: Focused;Sustained Focused Attention: Appears intact Sustained Attention: Appears intact Memory: Appears intact Memory Impairment: Decreased short term memory Awareness: Appears intact Problem Solving: Appears intact Safety/Judgment: Appears intact Comments: daughter states pt does not keep up with dates, current events, seasons, etc and this is why unable to answer questions correctly. Note decreased short term memory  throughout session but still functional for familiar ADL tasks. Sensation Sensation Light Touch: Appears Intact Hot/Cold: Appears Intact Proprioception: Appears Intact Stereognosis: Not tested Coordination Gross Motor Movements are Fluid and Coordinated: Yes Fine Motor Movements are Fluid and Coordinated: Yes Motor  Motor Motor: Abnormal postural alignment and control Motor - Skilled Clinical Observations: impaired 2/2 pain and R hip surgery Trunk/Postural Assessment  Cervical Assessment Cervical Assessment:  (forward head) Thoracic Assessment Thoracic Assessment:  (rounded shoulders) Lumbar Assessment Lumbar Assessment:  (posterior pelvic tilt) Postural Control Postural Control: Within Functional Limits  Balance Static Sitting Balance Static Sitting - Balance Support: Feet supported Static Sitting - Level of Assistance: 7: Independent Dynamic Sitting Balance Dynamic Sitting - Balance Support: During functional activity Dynamic Sitting - Level of Assistance: 5: Stand by assistance Extremity/Trunk Assessment RUE Assessment RUE Assessment: Within Functional Limits LUE Assessment LUE Assessment: Within Functional Limits   Leroy Libman 12/02/2020, 7:02 AM

## 2020-12-03 ENCOUNTER — Other Ambulatory Visit (HOSPITAL_COMMUNITY): Payer: Self-pay

## 2020-12-03 DIAGNOSIS — S72001S Fracture of unspecified part of neck of right femur, sequela: Secondary | ICD-10-CM | POA: Diagnosis not present

## 2020-12-03 LAB — GLUCOSE, CAPILLARY
Glucose-Capillary: 99 mg/dL (ref 70–99)
Glucose-Capillary: 105 mg/dL — ABNORMAL HIGH (ref 70–99)

## 2020-12-03 LAB — BASIC METABOLIC PANEL
Anion gap: 6 (ref 5–15)
BUN: 24 mg/dL — ABNORMAL HIGH (ref 8–23)
CO2: 30 mmol/L (ref 22–32)
Calcium: 8.9 mg/dL (ref 8.9–10.3)
Chloride: 101 mmol/L (ref 98–111)
Creatinine, Ser: 1.08 mg/dL — ABNORMAL HIGH (ref 0.44–1.00)
GFR, Estimated: 50 mL/min — ABNORMAL LOW (ref >60)
Glucose, Bld: 97 mg/dL (ref 70–99)
Potassium: 4 mmol/L (ref 3.5–5.1)
Sodium: 137 mmol/L (ref 135–145)

## 2020-12-03 LAB — CBC WITH DIFFERENTIAL/PLATELET
Abs Immature Granulocytes: 0.06 10*3/uL (ref 0.00–0.07)
Basophils Absolute: 0.1 10*3/uL (ref 0.0–0.1)
Basophils Relative: 1 %
Eosinophils Absolute: 0.3 10*3/uL (ref 0.0–0.5)
Eosinophils Relative: 3 %
HCT: 27 % — ABNORMAL LOW (ref 36.0–46.0)
Hemoglobin: 8.1 g/dL — ABNORMAL LOW (ref 12.0–15.0)
Immature Granulocytes: 1 %
Lymphocytes Relative: 17 %
Lymphs Abs: 1.7 10*3/uL (ref 0.7–4.0)
MCH: 26.6 pg (ref 26.0–34.0)
MCHC: 30 g/dL (ref 30.0–36.0)
MCV: 88.8 fL (ref 80.0–100.0)
Monocytes Absolute: 1 10*3/uL (ref 0.1–1.0)
Monocytes Relative: 10 %
Neutro Abs: 7.2 10*3/uL (ref 1.7–7.7)
Neutrophils Relative %: 68 %
Platelets: 530 10*3/uL — ABNORMAL HIGH (ref 150–400)
RBC: 3.04 MIL/uL — ABNORMAL LOW (ref 3.87–5.11)
RDW: 16.7 % — ABNORMAL HIGH (ref 11.5–15.5)
WBC: 10.3 10*3/uL (ref 4.0–10.5)
nRBC: 0 % (ref 0.0–0.2)

## 2020-12-03 MED ORDER — FERROUS SULFATE 325 (65 FE) MG PO TABS
325.0000 mg | ORAL_TABLET | Freq: Every day | ORAL | 0 refills | Status: DC
  Filled 2020-12-03: qty 90, 90d supply, fill #0

## 2020-12-03 MED ORDER — POLYSACCHARIDE IRON COMPLEX 150 MG PO CAPS
150.0000 mg | ORAL_CAPSULE | Freq: Every day | ORAL | Status: DC
  Administered 2020-12-03: 150 mg via ORAL
  Filled 2020-12-03: qty 1

## 2020-12-03 NOTE — Plan of Care (Signed)
Pt ready for discharge, daughter at bedside, both inhalers (pt meds from home) given to family.

## 2020-12-03 NOTE — Progress Notes (Signed)
Inpatient Rehabilitation Care Coordinator Discharge Note  The overall goal for the admission was met for:   Discharge location: Yes. D/c to home with her daughter who will provide 24/7 care.   Length of Stay: Yes. 9 days.   Discharge activity level: Yes. Supervision.   Home/community participation: Yes. Limited.   Services provided included: MD, RD, PT, OT, RN, CM, TR, Pharmacy, Neuropsych, and SW  Financial Services: Other: Uninsured  Choices offered to/list presented to:N/A  Follow-up services arranged: Home Health: [facility] ([facility]) and DME: has already  Comments (or additional information):  Patient/Family verbalized understanding of follow-up arrangements: Yes  Individual responsible for coordination of the follow-up plan: contact pt dtr [name] [phone]  Confirmed correct DME delivered: Rana Snare 12/03/2020    Rana Snare

## 2020-12-03 NOTE — Discharge Summary (Signed)
Physician Discharge Summary  Patient ID: Angelica Ferguson MRN: [id number] DOB/AGE: 85/26/1934 85 y.o.  Admit date: 11/23/2020 Discharge date: 12/04/2020 Discharge Diagnoses:  Principal Problem:   Closed fracture of right hip requiring operative repair, sequela Active Problems:   Pressure injury of skin   Atrial fibrillation (HCC)   Acute blood loss anemia   COPD (chronic obstructive pulmonary disease) (HCC)   Acute renal failure (HCC)   Discharged Condition: good  Significant Diagnostic Studies: VAS [place] LOWER EXTREMITY VENOUS (DVT)  Result Date: 11/29/2020  Lower Venous DVT Study Patient Name:  Angelica Ferguson  Date of Exam:   11/28/2020 Medical Rec #: [id number]        Accession #:    [id number] Date of Birth: 11/27/1932       Patient Gender: F Patient Age:   68 years Exam Location:  [facility] Procedure:      VAS [place] LOWER EXTREMITY VENOUS (DVT) Referring Phys: Faith Rogue --------------------------------------------------------------------------------  Indications: RLE edema.  Risk Factors: Surgery S/P hip fracture repair (11/19/20). Comparison Study: No previous exams Performing Technologist: Jody Hill RVT, RDMS  Examination Guidelines: A complete evaluation includes B-mode imaging, spectral Doppler, color Doppler, and power Doppler as needed of all accessible portions of each vessel. Bilateral testing is considered an integral part of a complete examination. Limited examinations for reoccurring indications may be performed as noted. The reflux portion of the exam is performed with the patient in reverse Trendelenburg.  +---------+---------------+---------+-----------+----------+--------------+ RIGHT    CompressibilityPhasicitySpontaneityPropertiesThrombus Aging +---------+---------------+---------+-----------+----------+--------------+ CFV      Full           Yes      Yes                                  +---------+---------------+---------+-----------+----------+--------------+ SFJ      Full                                                        +---------+---------------+---------+-----------+----------+--------------+ FV Prox  Full           Yes      Yes                                 +---------+---------------+---------+-----------+----------+--------------+ FV Mid   Full           Yes      Yes                                 +---------+---------------+---------+-----------+----------+--------------+ FV DistalFull           Yes      Yes                                 +---------+---------------+---------+-----------+----------+--------------+ PFV      Full                                                        +---------+---------------+---------+-----------+----------+--------------+  POP      Full           Yes      Yes                                 +---------+---------------+---------+-----------+----------+--------------+ PTV      Full                                                        +---------+---------------+---------+-----------+----------+--------------+ PERO     Full                                                        +---------+---------------+---------+-----------+----------+--------------+   +---------+---------------+---------+-----------+----------+--------------+ LEFT     CompressibilityPhasicitySpontaneityPropertiesThrombus Aging +---------+---------------+---------+-----------+----------+--------------+ CFV      Full           Yes      Yes                                 +---------+---------------+---------+-----------+----------+--------------+ SFJ      Full                                                        +---------+---------------+---------+-----------+----------+--------------+ FV Prox  Full           Yes      Yes                                  +---------+---------------+---------+-----------+----------+--------------+ FV Mid   Full           Yes      Yes                                 +---------+---------------+---------+-----------+----------+--------------+ FV DistalFull           Yes      Yes                                 +---------+---------------+---------+-----------+----------+--------------+ PFV      Full                                                        +---------+---------------+---------+-----------+----------+--------------+ POP      Full           Yes      Yes                                 +---------+---------------+---------+-----------+----------+--------------+ PTV  Full                                                        +---------+---------------+---------+-----------+----------+--------------+ PERO     Full                                                        +---------+---------------+---------+-----------+----------+--------------+     Summary: BILATERAL: - No evidence of deep vein thrombosis seen in the lower extremities, bilaterally. - No evidence of superficial venous thrombosis in the lower extremities, bilaterally. -No evidence of popliteal cyst, bilaterally. RIGHT: Subcutaneous edema in area of pop fossa and calf   *See table(s) above for measurements and observations. Electronically signed by Heath Lark on 11/29/2020 at 2:07:40 PM.    Final       Labs:  Basic Metabolic Panel: Recent Labs  Lab 11/30/20 0602 12/03/20 0615  NA 135 137  K 3.8 4.0  CL 101 101  CO2 28 30  GLUCOSE 110* 97  BUN 19 24*  CREATININE 1.05* 1.08*  CALCIUM 8.8* 8.9    CBC: Recent Labs  Lab 11/30/20 0602 12/03/20 0615  WBC 9.0 10.3  NEUTROABS  --  7.2  HGB 8.3* 8.1*  HCT 26.6* 27.0*  MCV 87.5 88.8  PLT 545* 530*    CBG: Recent Labs  Lab 12/02/20 1156 12/02/20 1651 12/02/20 2057 12/03/20 0615 12/03/20 1114  GLUCAP 117* 136* 108* 99 105*    Brief HPI:    Angelica Ferguson is a 87 y.o. female with history of T2DM, CHF, HTN who was admitted on 11/16/2020 to [facility] after fall on 11/11/2020 with progressive pain and difficulty walking.  She was found to have right femoral neck fracture with angulation and underwent right hip hemiarthroplasty on 07/28 by Dr.Krasinki to the collar all of a sudden it was black-and-white was not holding the collar come on what he means which show episode.  Postop is WBAT with total hip precautions.  She has had issues with pain and lethargy as well as rising leukocytosis with white count up to 21.7.  Infectious work-up was negative.  Therapy evaluations done revealing functional decline and CIR was recommended for follow-up therapy   Hospital Course: Angelica Ferguson was admitted to rehab 11/23/2020 for inpatient therapies to consist of PT and OT at least three hours five days a week. Past admission physiatrist, therapy team and rehab RN have worked together to provide customized collaborative inpatient rehab.  Respiratory status is stable and pain has been reasonably controlled with as needed use of tramadol.  Blood pressures and heart rate were monitored on TID basis and have been stable.  P.o. intake has been good and she is continent of bowel and bladder.  Her Diabetes has been monitored with ac/hs CBG checks and SSI was use prn for tighter BS control.  Blood sugars have been relatively controlled.  BLE Dopplers were done due to increase in lower extremity edema and were negative for DVT.    Follow-up check of  BMET showed AKI and she was advised to increase fluid intake.  Follow-up CBC showed that leukocytosis has resolved but downward trend in H&H noted without any  signs of bleeding.  Recommend recheck of labs in 1 to 2 weeks to monitor for stability of H&H as well as electrolytes.  She was found to have stage II superficial breakdown on sacrum and pressure-relief measures have been ongoing to help with healing.  Right hip incision is  intact and is healing well without any signs of infection.  Staples were removed on 8/10 without difficulty. She will continue to [facility]   Rehab course: During patient's stay in rehab weekly team conferences were held to monitor patient's progress, set goals and discuss barriers to discharge. At admission, patient required min to mod assist basic ADL tasks and min assist with mobility. She  has had improvement in activity tolerance, balance, postural control as well as ability to compensate for deficits. She is able to complete ADL tasks with supervision.  She requires supervision for transfers and to ambulate 150' with RW.  Daughter has been very supportive and present for multiple therapy sessions.  Family education has been completed regarding all aspects of safety and care.  Discharge disposition: 01-Home or Self Care  Diet: Carb modified,  Special Instructions:  1. Recheck CBC and BMET in one week to monitor H/H and renal status.  2.  Continue total hip precautions   Allergies as of 12/03/2020   No Known Allergies      Medication List     STOP taking these medications    HYDROcodone-acetaminophen 5-325 MG tablet Commonly known as: NORCO/VICODIN   methocarbamol 500 MG tablet Commonly known as: ROBAXIN   ramipril 10 MG capsule Commonly known as: ALTACE       TAKE these medications    albuterol 108 (90 Base) MCG/ACT inhaler Commonly known as: VENTOLIN HFA Inhale 1 puff into the lungs every 6 (six) hours as needed for wheezing or shortness of breath.   atorvastatin 80 MG tablet Commonly known as: LIPITOR Take 1 tablet (80 mg total) by mouth every evening.   benzonatate 100 MG capsule Commonly known as: TESSALON Take 200 mg by mouth every 8 (eight) hours as needed for cough.   Bevespi Aerosphere 9-4.8 MCG/ACT Aero Generic drug: Glycopyrrolate-Formoterol Inhale 2 puffs into the lungs 2 (two) times  daily.   bisoprolol 5 MG tablet Commonly known as: ZEBETA Take 1 tablet (5 mg total) by mouth 2 (two) times daily. What changed:  how much to take when to take this additional instructions   diltiazem 60 MG tablet Commonly known as: CARDIZEM Take 1 tablet (60 mg total) by mouth 2 (two) times daily.   FeroSul 325 (65 FE) MG tablet Generic drug: ferrous sulfate Take 1 tablet (325 mg total) by mouth daily.   lansoprazole 15 MG capsule Commonly known as: PREVACID Take 15 mg by mouth daily.   metFORMIN 500 MG tablet Commonly known as: GLUCOPHAGE Take 500 mg by mouth every evening.   polyethylene glycol powder 17 GM/SCOOP powder Commonly known as: GLYCOLAX/MIRALAX Take 17 g by mouth daily.   Qvar RediHaler 80 MCG/ACT inhaler Generic drug: beclomethasone Inhale 2 puffs into the lungs in the morning and at bedtime.   Savaysa 30 MG Tabs tablet Generic drug: edoxaban Take 1 tablet (30 mg total) by mouth daily.   Senexon-S 8.6-50 MG tablet Generic drug: senna-docusate Take 2 tablets by mouth daily at 6 (six) AM. What changed:  how much to take when to take this   traMADol 50 MG tablet--Rx#30 pills Commonly known as: Janean SarkULTRAM  Take 1 tablet (50 mg total) by mouth every 6 (six) hours as needed for moderate pain.   traZODone 50 MG tablet Commonly known as: DESYREL Take 0.5-1 tablets (25-50 mg total) by mouth at bedtime as needed for sleep.        Follow-up Information     Lovorn, Aundra Millet, MD Follow up.   Specialty: Physical Medicine and Rehabilitation Why: As needed Contact information: [address] [place] [address] [phone]         Juanell Fairly, MD. Call.   Specialty: Orthopedic Surgery Why: for post op appointment Contact information: [address] [place] [address] [phone]         Vonna Kotyk, NP. Call.   Specialty: Family Medicine Why: for post hospital follow up and for follow up CBC Contact  information: [address] [place] [address] [phone]                 Signed: Jacquelynn Cree 12/04/2020, 6:22 PM

## 2020-12-03 NOTE — Progress Notes (Signed)
PROGRESS NOTE   Subjective/Complaints:  Slept well-  Got staples out.  Ready for d/c.  ROS:   Pt denies SOB, abd pain, CP, N/V/C/D, and vision changes     Objective:   No results found. Recent Labs    12/03/20 0615  WBC 10.3  HGB 8.1*  HCT 27.0*  PLT 530*    Recent Labs    12/03/20 0615  NA 137  K 4.0  CL 101  CO2 30  GLUCOSE 97  BUN 24*  CREATININE 1.08*  CALCIUM 8.9     Intake/Output Summary (Last 24 hours) at 12/03/2020 0913 Last data filed at 12/03/2020 7062 Gross per 24 hour  Intake 580 ml  Output --  Net 580 ml     Pressure Injury 11/23/20 Coccyx Mid Stage 2 -  Partial thickness loss of dermis presenting as a shallow open injury with a red, pink wound bed without slough. almost resolved (Active)  11/23/20 1836  Location: Coccyx  Location Orientation: Mid  Staging: Stage 2 -  Partial thickness loss of dermis presenting as a shallow open injury with a red, pink wound bed without slough.  Wound Description (Comments): almost resolved  Present on Admission: Yes    Physical Exam: Vital Signs Blood pressure (!) 107/54, pulse 75, temperature (!) 97.4 F (36.3 C), resp. rate 18, height 5\' 3"  (1.6 m), weight 55.3 kg, SpO2 95 %.      General: awake, alert, appropriate, sitting in bed; on R side;  NAD HENT: conjugate gaze; oropharynx moist CV: regular rate; no JVD Pulmonary: CTA B/L; no W/R/R- good air movement GI: soft, NT, ND, (+)BS Psychiatric: appropriate Neurological: alert, HOH  Skin: R hip looks great- staples intact; looks great-  Sacrum with foam dressing, C/D/I- abrasion right knee Neuro:  Alert and oriented x 3. Normal insight and awareness. Intact Memory. Normal language and speech. Cranial nerve exam unremarkable. Motor and sensory without focal deficits other than pain inhibition. Normal reflexes Musculoskeletal: right hip tender to palpation. Able to tolerate wb. Minimal calf  tenderness with palpation   Assessment/Plan: 1. Functional deficits which require 3+ hours per day of interdisciplinary therapy in a comprehensive inpatient rehab setting. Physiatrist is providing close team supervision and 24 hour management of active medical problems listed below. Physiatrist and rehab team continue to assess barriers to discharge/monitor patient progress toward functional and medical goals  Care Tool:  Bathing    Body parts bathed by patient: Right arm, Left arm, Chest, Abdomen, Front perineal area, Buttocks, Right upper leg, Left upper leg, Left lower leg, Face, Right lower leg   Body parts bathed by helper: Right lower leg     Bathing assist Assist Level: Supervision/Verbal cueing     Upper Body Dressing/Undressing Upper body dressing   What is the patient wearing?: Pull over shirt    Upper body assist Assist Level: Independent    Lower Body Dressing/Undressing Lower body dressing      What is the patient wearing?: Underwear/pull up, Pants     Lower body assist Assist for lower body dressing: Supervision/Verbal cueing     Toileting Toileting    Toileting assist Assist for toileting: Supervision/Verbal cueing  Transfers Chair/bed transfer  Transfers assist     Chair/bed transfer assist level: Supervision/Verbal cueing Chair/bed transfer assistive device: Arboriculturist assist      Assist level: Supervision/Verbal cueing Assistive device: Walker-rolling Max distance: 160   Walk 10 feet activity   Assist     Assist level: Supervision/Verbal cueing Assistive device: Walker-rolling   Walk 50 feet activity   Assist    Assist level: Supervision/Verbal cueing Assistive device: Walker-rolling    Walk 150 feet activity   Assist Walk 150 feet activity did not occur: Safety/medical concerns  Assist level: Supervision/Verbal cueing Assistive device: Walker-rolling    Walk 10 feet on  uneven surface  activity   Assist Walk 10 feet on uneven surfaces activity did not occur: Safety/medical concerns   Assist level: Contact Guard/Touching assist Assistive device: Photographer Will patient use wheelchair at discharge?: No             Wheelchair 50 feet with 2 turns activity    Assist            Wheelchair 150 feet activity     Assist          Blood pressure (!) 107/54, pulse 75, temperature (!) 97.4 F (36.3 C), resp. rate 18, height 5\' 3"  (1.6 m), weight 55.3 kg, SpO2 95 %.  Medical Problem List and Plan: 1.  Functional and mobility deficits secondary to right femoral neck fx after fall 7/20. Right hip hemi performed 7/28             -WBAT, posterior hip precautions             -patient may shower             -ELOS/Goals: 8/11, supervision with PT/OT  -con't PT and OT- placed order to d/c staples/sutures and place steristrips. - d/c today  2.  Antithrombotics: -DVT/anticoagulation:  Pharmaceutical: Other (comment)--Savayas daily -LE venous dopplers negative---d/w patient today             -antiplatelet therapy: N/A 3. Pain Management: Hydrocodone prn.   -heat, tylenol, elevation prn  8/9- not taking hydrocodone- just tylenol- denies pain- con't regimen prn  8/11- taking Norco at d/c for ride home, but usually doesn't take.  4. Mood: LCSW to follow for evaluation and support.              -antipsychotic agents: N/A 5. Neuropsych: This patient is capable of making decisions on her own behalf. 6. Skin/Wound Care: Routine pressure relief measures. Daily dressing changes to right hip -pressure relief, foam dressing to coccyx   -encourage PO 8/8- will reassess coccyx in AM  8/9- looking well- granulating 7. Fluids/Electrolytes/Nutrition: encourage fluids                 8. A fib: Monitor HR TID--continue endoxaban daily --bisoprolol and Cardizem bid. 9. HTN: Monitor BP tid--watch for orthostatic  changes. 10.  T2DM: Hgb A1C-6.2--was on metformin PTA. --will monitor BS ac/hs and use SSI --Well Controlled despite holding metformin  CBG (last 3)  Recent Labs    12/02/20 1651 12/02/20 2057 12/03/20 0615  GLUCAP 136* 108* 99    8/11- BG's controlled- con't regimen 11. Leucocytosis: Trending down 14.4, monitor for fevers and other signs of infection.   8/8- WBC 9.0- - much better- con't to monitor 12. ABLA: hgb holding at 9.4 -stable  8/8- Hb down to 8.3- will recheck Thursday  and if still going down, will do further work up 8/11- Hb 8.1- needs to have someone check labs- PCP in the next 1-2 weeks. Don't know why dropping  13. Acute renal failure: Encourage fluid intake-              -BUN/CR improved to 23/0.95             -push fluids, recheck Monday  8/8- BUN stable at 19 but Cr up slightly to 1.05- will recheck Thursday.   8/10- labs in AM  8/11- Cr 1.08- needs to push fluids. Will let daughter know.  14. COPD: scheduled qvar, proventil prn. May use with therapy for exercise induced symptoms 15. Dispo  8/9- d/c Thursday- and needs Rx for trazodone 25-50 mg nightly as needed at d/c. Per pt/ daughter request.   8/10- removing staples today   8/11- d/c today    LOS: 10 days A FACE TO FACE EVALUATION WAS PERFORMED  Angelica Ferguson 12/03/2020, 9:13 AM

## 2020-12-03 NOTE — Progress Notes (Signed)
Pt d/c to home with personal belongings, home medications given back to family at bedside. Discharge instructions provided to pt and family verbalizing an understanding of d/c medications, follow up appts and home instructions.

## 2020-12-04 DIAGNOSIS — N179 Acute kidney failure, unspecified: Secondary | ICD-10-CM

## 2020-12-04 DIAGNOSIS — D62 Acute posthemorrhagic anemia: Secondary | ICD-10-CM

## 2020-12-04 DIAGNOSIS — I4891 Unspecified atrial fibrillation: Secondary | ICD-10-CM

## 2020-12-04 DIAGNOSIS — J449 Chronic obstructive pulmonary disease, unspecified: Secondary | ICD-10-CM

## 2020-12-04 HISTORY — DX: Acute kidney failure, unspecified: N17.9

## 2020-12-04 HISTORY — DX: Acute posthemorrhagic anemia: D62

## 2020-12-10 ENCOUNTER — Other Ambulatory Visit (HOSPITAL_BASED_OUTPATIENT_CLINIC_OR_DEPARTMENT_OTHER): Payer: Self-pay

## 2020-12-11 ENCOUNTER — Telehealth: Payer: Self-pay | Admitting: *Deleted

## 2020-12-11 NOTE — Telephone Encounter (Signed)
Roger Shelter MSW with [facility] called to ask for ok to move the visit with Angelica Ferguson this week to next week at the request of the family. I called to give approval but her VM was full.

## 2020-12-15 NOTE — Telephone Encounter (Signed)
I left her a VM today with the approval.

## 2021-01-15 ENCOUNTER — Ambulatory Visit: Payer: Medicaid Other | Admitting: Physician Assistant

## 2021-06-22 DIAGNOSIS — Z87891 Personal history of nicotine dependence: Secondary | ICD-10-CM | POA: Insufficient documentation

## 2021-06-22 HISTORY — DX: Personal history of nicotine dependence: Z87.891

## 2022-08-29 IMAGING — DX DG HIP (WITH OR WITHOUT PELVIS) 1V PORT*R*
2 series · 2 of 2 positions shown · non-contrast
Comparison: None.

CLINICAL DATA: Status post arthroplasty.

EXAM:
DG HIP (WITH OR WITHOUT PELVIS) 1V PORT RIGHT

[pelvis ap]
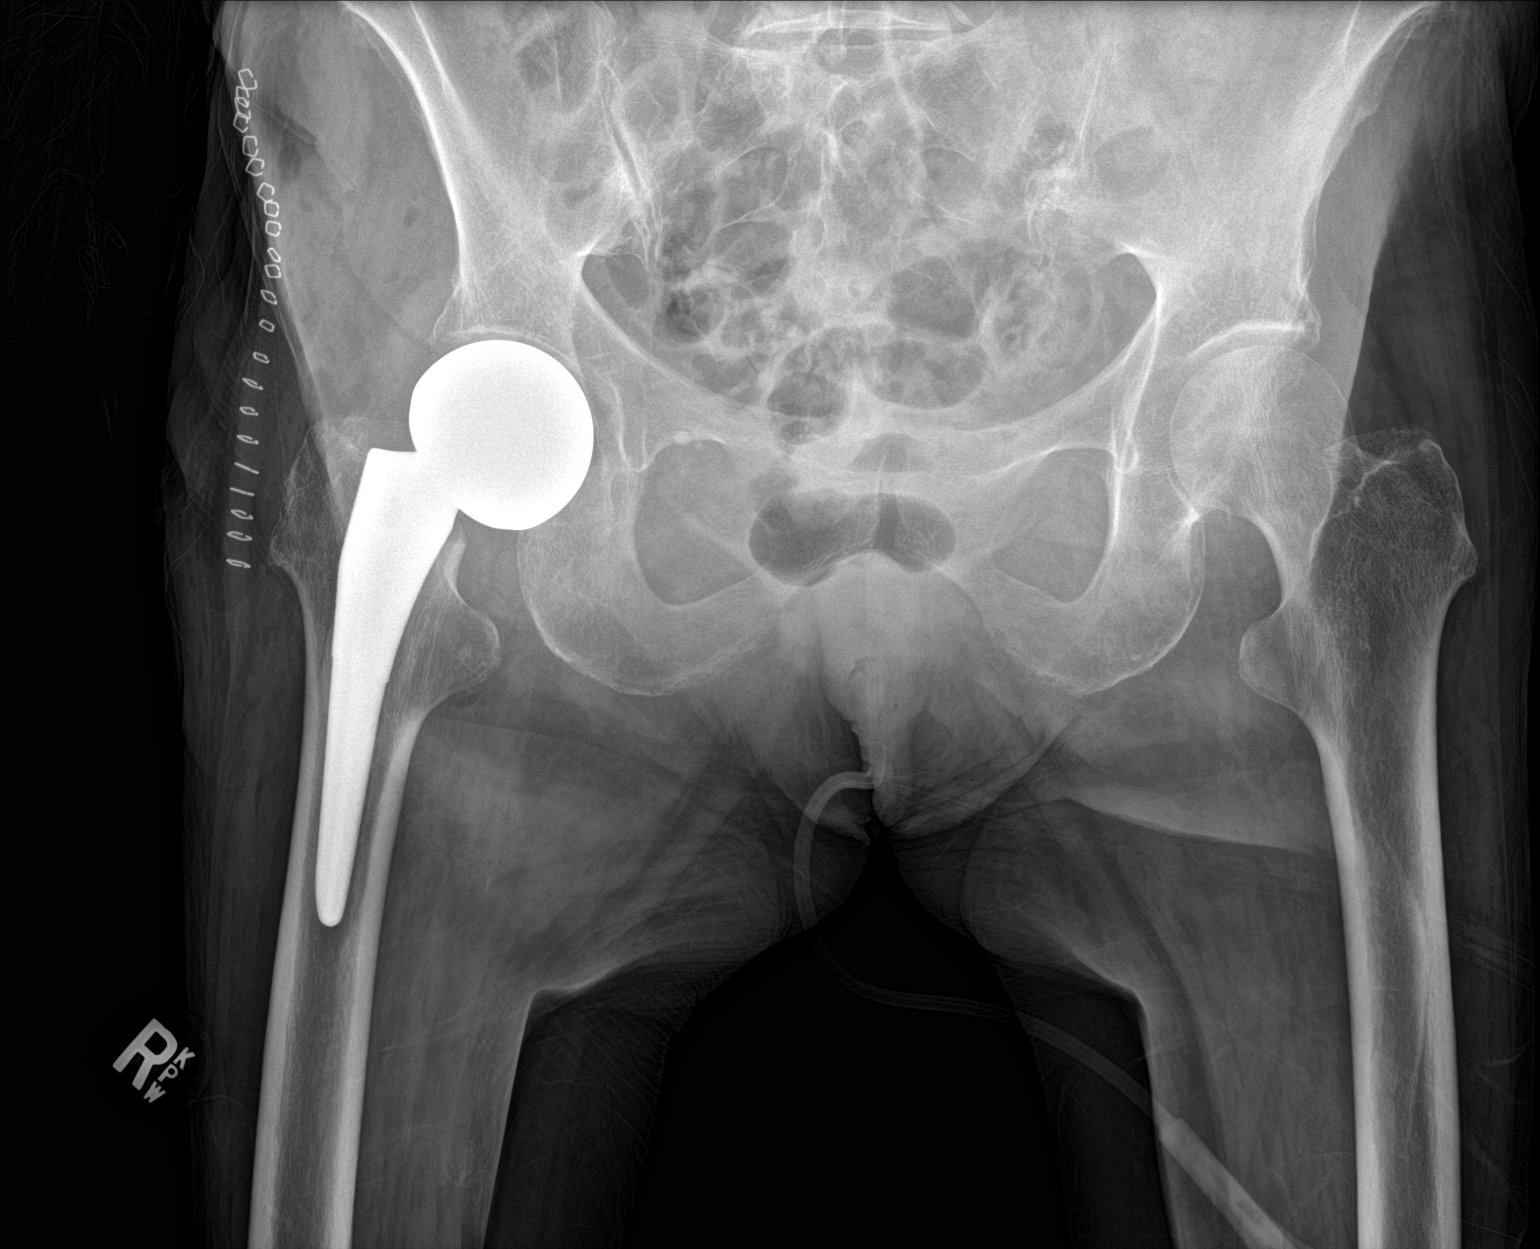

[hip lat]
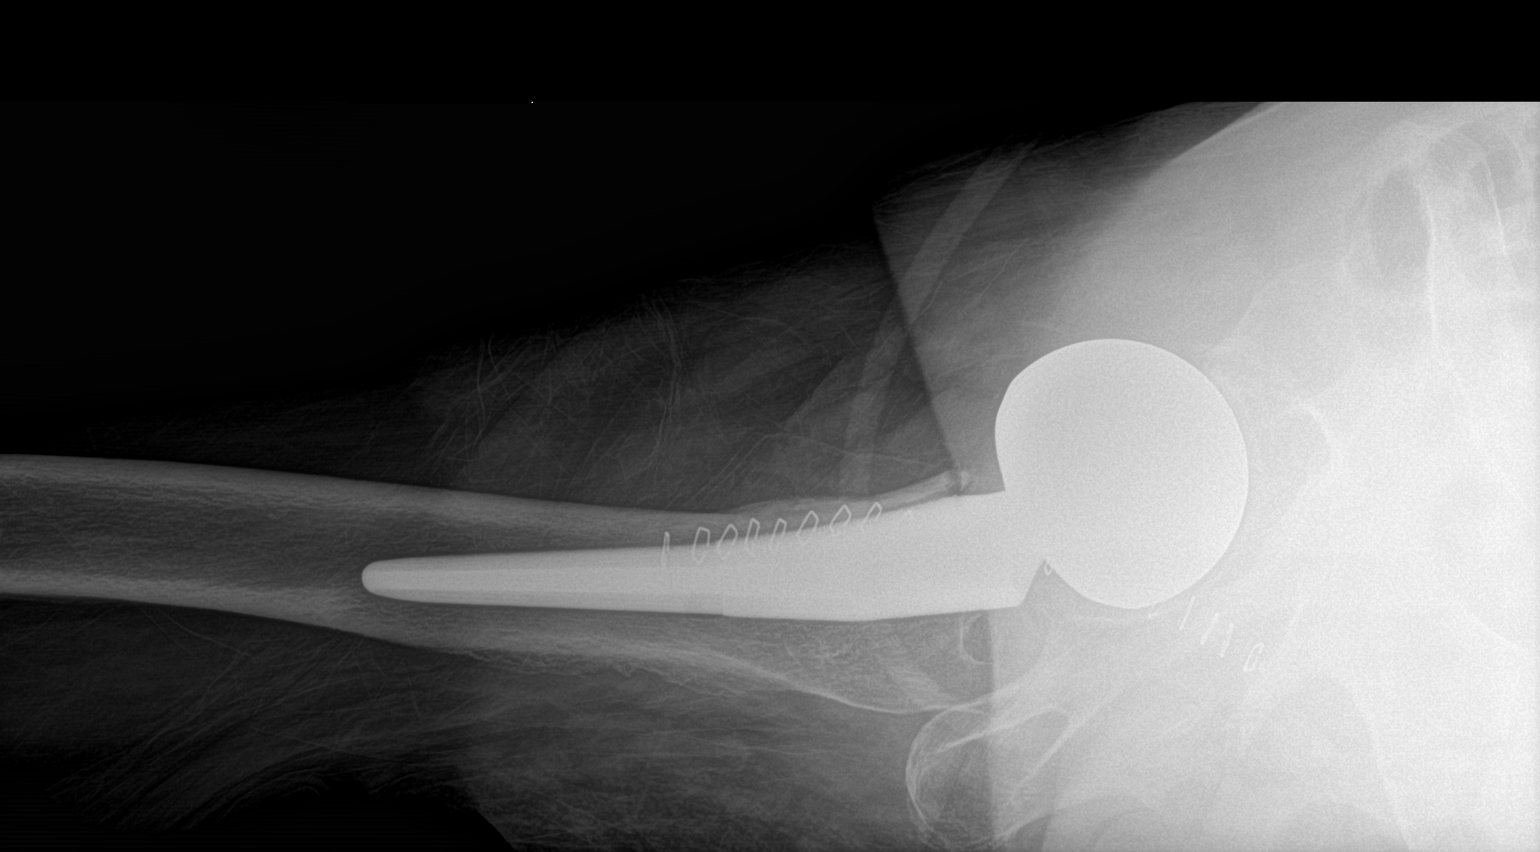

[2 of 2 positions shown; findings below may reference images not displayed]

FINDINGS: Right hip prosthesis appears to be well situated. Aspect of
postoperative changes seen in the surrounding soft tissues.
IMPRESSION: Status post right hip arthroplasty.

## 2022-08-31 IMAGING — DX DG ABDOMEN 1V
2 series · 2 of 2 positions shown · non-contrast
Comparison: None.

CLINICAL DATA: Status post fall. Hip fracture. Patient is now
constipated.

EXAM:
ABDOMEN - 1 VIEW

[abdomen supine (1 of 2)]
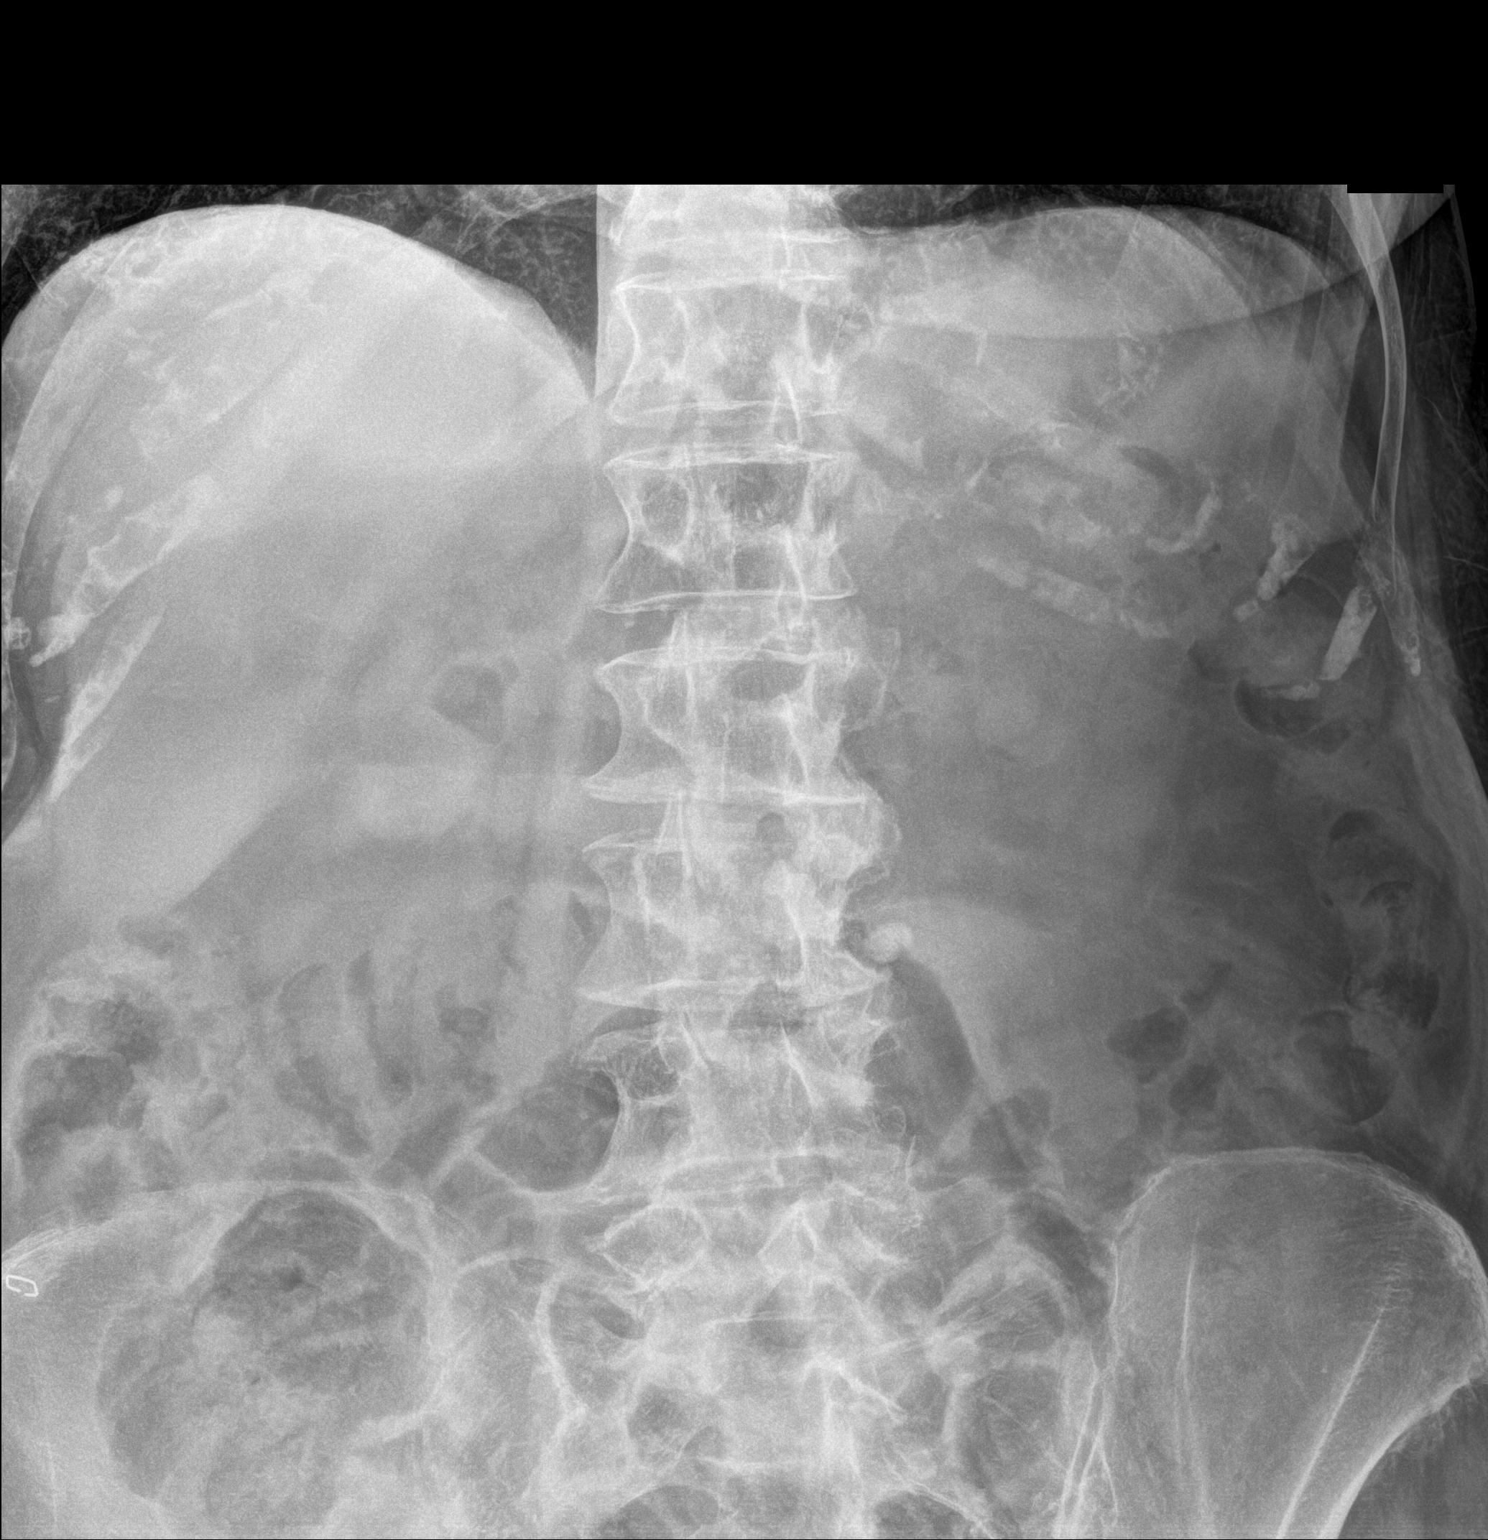

[abdomen supine (2 of 2)]
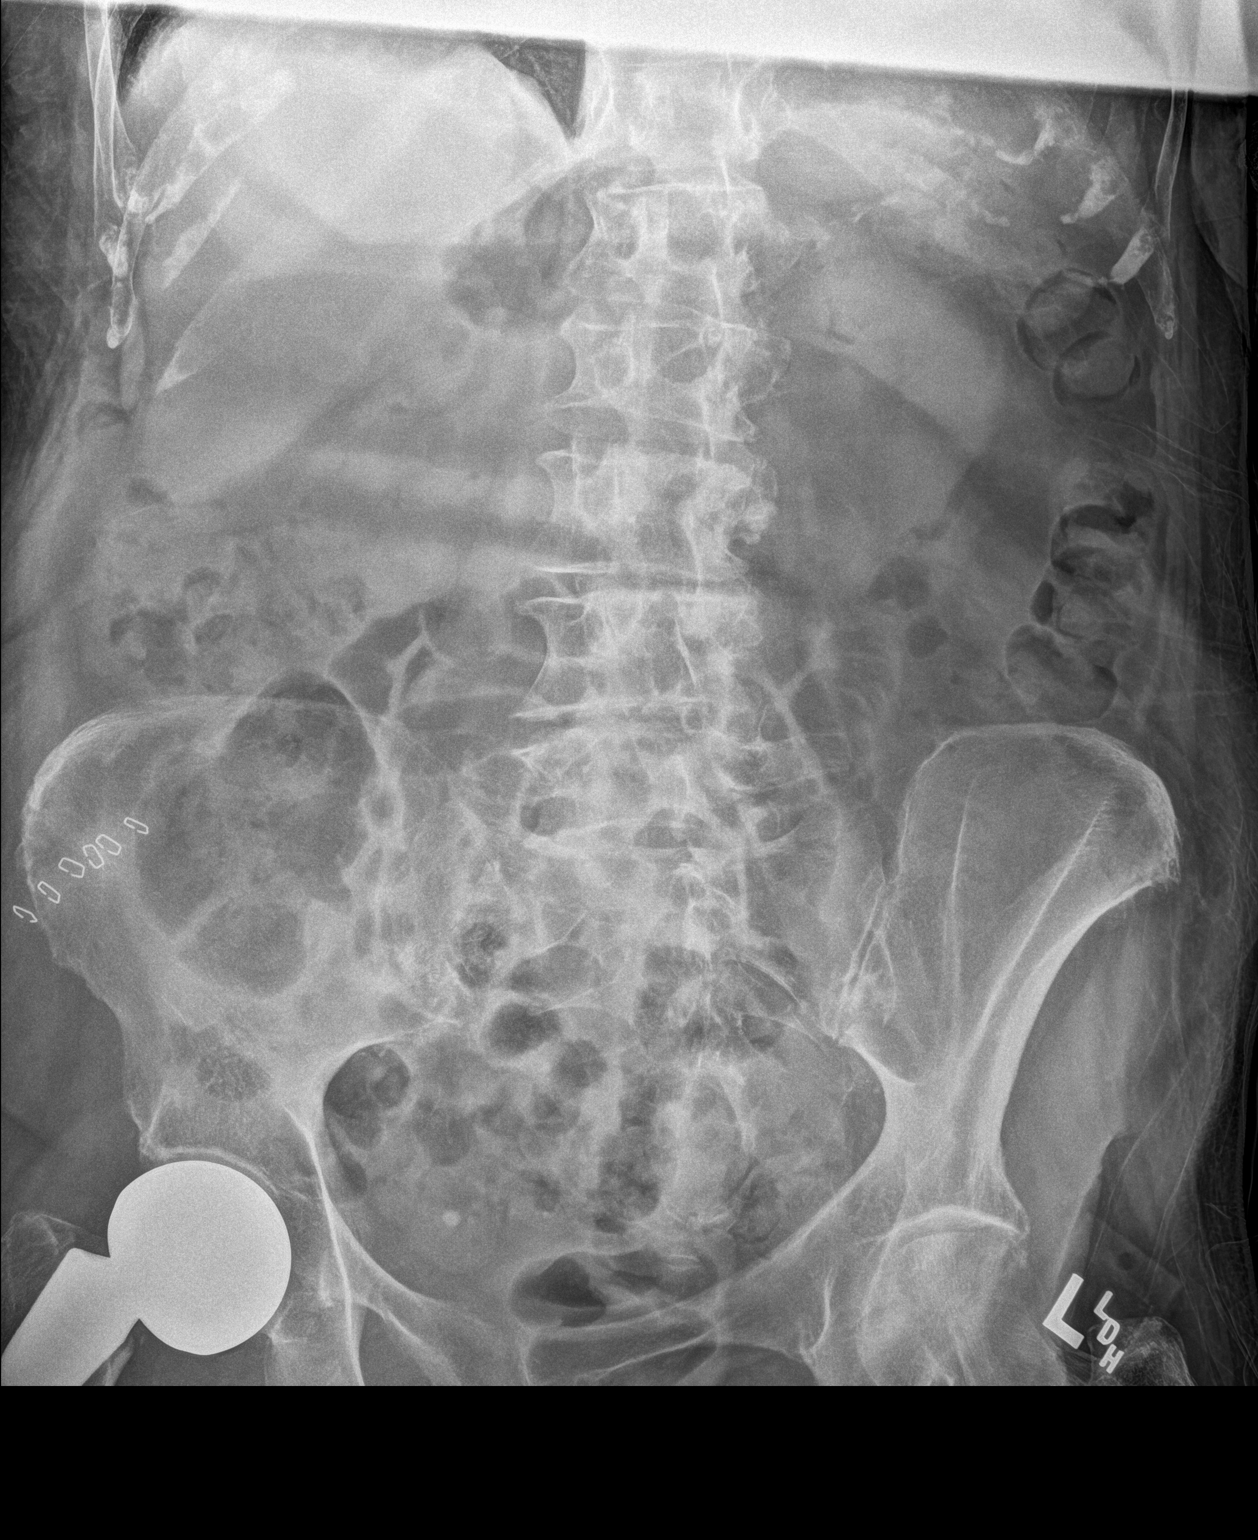

[2 of 2 positions shown; findings below may reference images not displayed]

FINDINGS: Mild stool burden noted within the colon. No dilated loops of large
or small bowel. Gas is noted up to the level of the rectum. Status
post right hip arthroplasty.
IMPRESSION: Nonobstructive bowel gas pattern.

## 2022-08-31 IMAGING — DX DG CHEST 1V PORT
1 series · 1 of 1 positions shown · non-contrast
Comparison: None.

CLINICAL DATA: Fall on [DATE].  Hip fracture.

EXAM:
PORTABLE CHEST 1 VIEW

[chest ap]
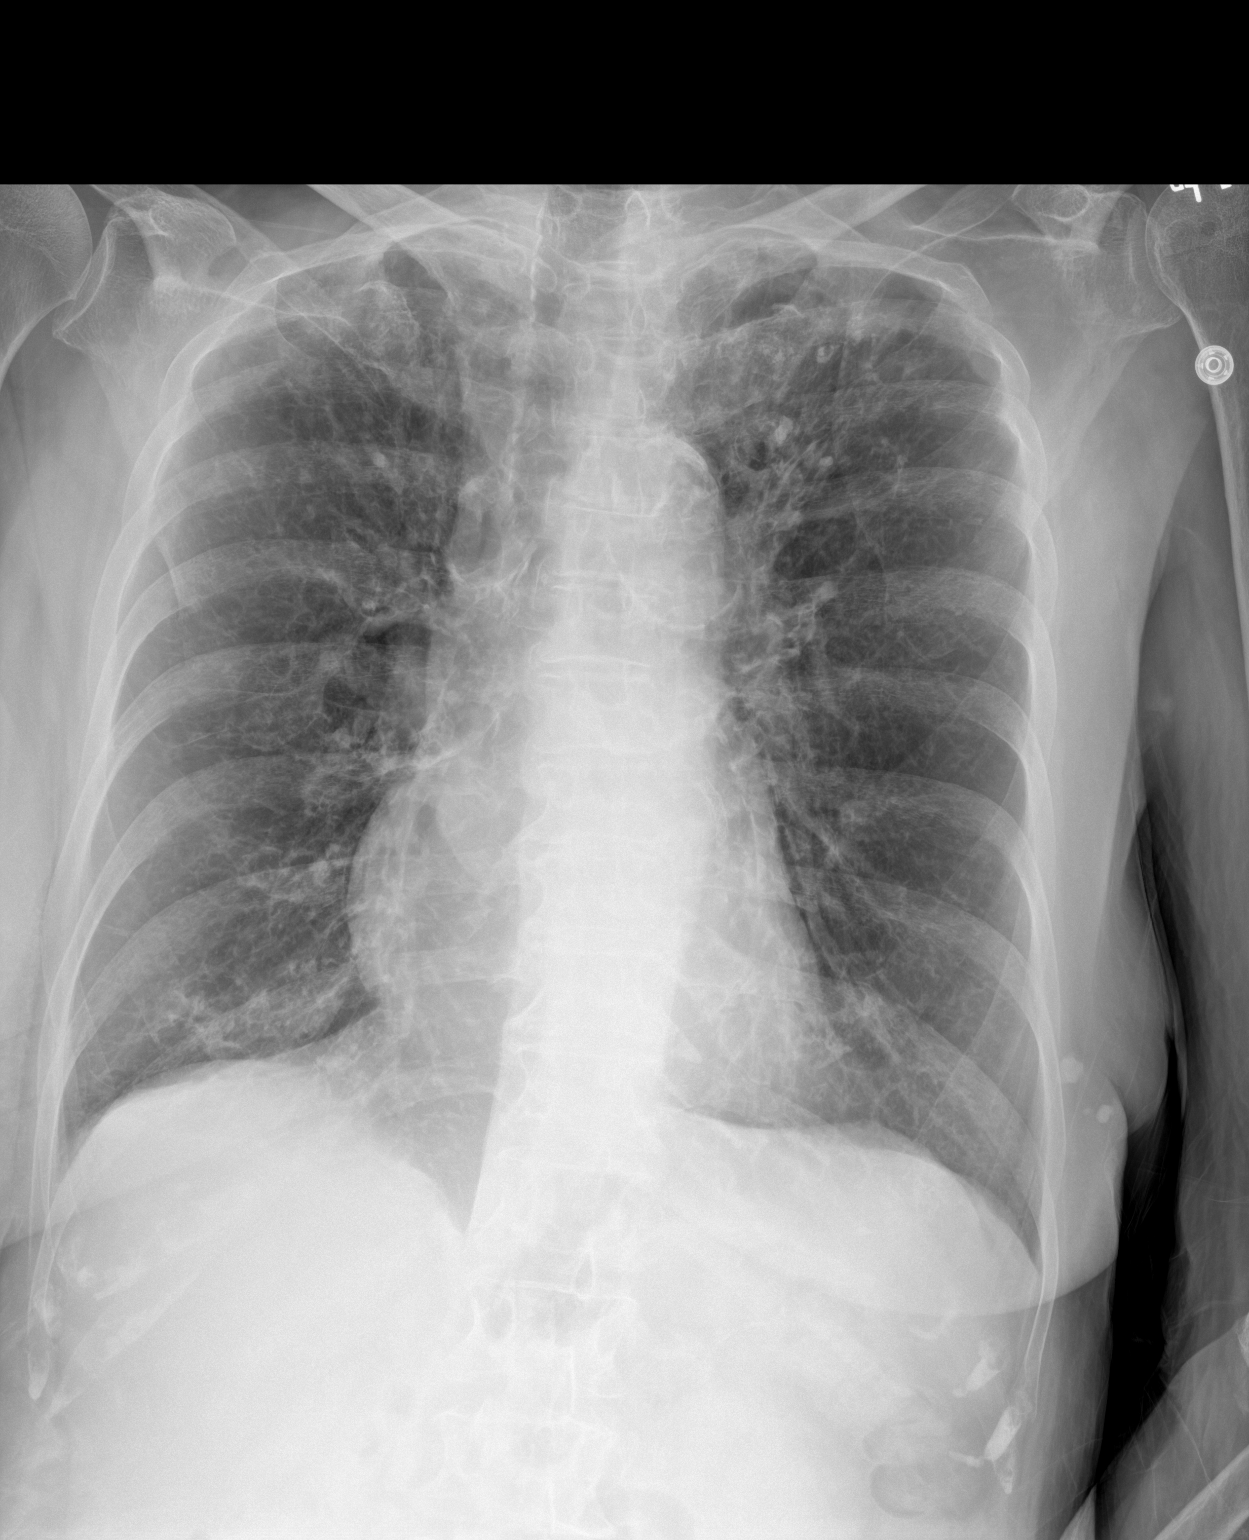

[1 of 1 positions shown; findings below may reference images not displayed]

FINDINGS: Midline trachea. Normal heart size. Atherosclerosis in the
transverse aorta. No pleural effusion or pneumothorax. Diffuse
interstitial thickening with more confluent upper lobe
reticulonodular opacities with volume loss and hilar retraction
superiorly. Right base scarring. No well-defined lobar
consolidation.
IMPRESSION: Constellation of findings which are likely related to chronic
atypical infection, possibly mycobacterial. No convincing evidence
of acute superimposed process. Comparison with prior radiographs
would be informative if available.

No posttraumatic deformity identified.

Aortic Atherosclerosis ([id number]).

## 2022-09-16 DIAGNOSIS — H10023 Other mucopurulent conjunctivitis, bilateral: Secondary | ICD-10-CM | POA: Diagnosis not present

## 2022-09-16 DIAGNOSIS — Z681 Body mass index (BMI) 19 or less, adult: Secondary | ICD-10-CM | POA: Diagnosis not present

## 2022-10-05 DIAGNOSIS — E118 Type 2 diabetes mellitus with unspecified complications: Secondary | ICD-10-CM | POA: Diagnosis not present

## 2022-10-05 DIAGNOSIS — H6122 Impacted cerumen, left ear: Secondary | ICD-10-CM | POA: Diagnosis not present

## 2022-10-05 DIAGNOSIS — I4891 Unspecified atrial fibrillation: Secondary | ICD-10-CM | POA: Diagnosis not present

## 2022-10-05 DIAGNOSIS — N1831 Chronic kidney disease, stage 3a: Secondary | ICD-10-CM | POA: Diagnosis not present

## 2022-10-05 DIAGNOSIS — J449 Chronic obstructive pulmonary disease, unspecified: Secondary | ICD-10-CM | POA: Diagnosis not present

## 2022-10-05 DIAGNOSIS — Z862 Personal history of diseases of the blood and blood-forming organs and certain disorders involving the immune mechanism: Secondary | ICD-10-CM | POA: Diagnosis not present

## 2022-11-03 DIAGNOSIS — D649 Anemia, unspecified: Secondary | ICD-10-CM | POA: Insufficient documentation

## 2022-11-03 HISTORY — DX: Anemia, unspecified: D64.9

## 2022-12-01 ENCOUNTER — Ambulatory Visit: Payer: Self-pay | Admitting: Internal Medicine

## 2023-02-20 DIAGNOSIS — J309 Allergic rhinitis, unspecified: Secondary | ICD-10-CM | POA: Insufficient documentation

## 2023-02-20 HISTORY — DX: Allergic rhinitis, unspecified: J30.9

## 2023-03-09 ENCOUNTER — Ambulatory Visit: Payer: Self-pay | Admitting: Internal Medicine

## 2023-03-29 ENCOUNTER — Ambulatory Visit: Admit: 2023-03-29 | Payer: Self-pay | Admitting: Ophthalmology

## 2023-03-29 SURGERY — PHACOEMULSIFICATION, CATARACT, WITH IOL INSERTION
Anesthesia: Topical | Laterality: Left

## 2023-03-30 DIAGNOSIS — N182 Chronic kidney disease, stage 2 (mild): Secondary | ICD-10-CM

## 2023-03-30 HISTORY — DX: Chronic kidney disease, stage 2 (mild): N18.2

## 2023-04-10 ENCOUNTER — Encounter: Payer: Self-pay | Admitting: Ophthalmology

## 2023-04-10 NOTE — Anesthesia Preprocedure Evaluation (Addendum)
Anesthesia Evaluation  Patient identified by MRN, date of birth, ID band Patient awake    Reviewed: Allergy & Precautions, H&P , NPO status , Patient's Chart, lab work & pertinent test results  Airway Mallampati: I  TM Distance: >3 FB Neck ROM: Full    Dental no notable dental hx. (+) Upper Dentures, Lower Dentures, Edentulous Lower, Edentulous Upper Left her dentures out today:   Pulmonary COPD, former smoker   Pulmonary exam normal  + wheezing      Cardiovascular hypertension, + CAD  Normal cardiovascular exam Rhythm:Regular Rate:Normal     Neuro/Psych negative neurological ROS  negative psych ROS   GI/Hepatic negative GI ROS, Neg liver ROS,GERD  ,,  Endo/Other  diabetes    Renal/GU Renal diseasenegative Renal ROS  negative genitourinary   Musculoskeletal negative musculoskeletal ROS (+)    Abdominal   Peds negative pediatric ROS (+)  Hematology negative hematology ROS (+) Blood dyscrasia, anemia   Anesthesia Other Findings Previous cataract surgery right eye in [place] Hypertension  GERD (gastroesophageal reflux disease) Diabetes mellitus without complication (HCC)  Atrial fibrillation (HCC) Hyperlipidemia CAD Heart artery stent  CVA Hearing aids both ears, can hear if you face her directly, speak loudly and clearly    Reproductive/Obstetrics negative OB ROS                             Anesthesia Physical Anesthesia Plan  ASA: 3  Anesthesia Plan: MAC   Post-op Pain Management:    Induction: Intravenous  PONV Risk Score and Plan:   Airway Management Planned: Natural Airway and Nasal Cannula  Additional Equipment:   Intra-op Plan:   Post-operative Plan:   Informed Consent: I have reviewed the patients History and Physical, chart, labs and discussed the procedure including the risks, benefits and alternatives for the proposed anesthesia with the patient or  authorized representative who has indicated his/her understanding and acceptance.     Dental Advisory Given  Plan Discussed with: Anesthesiologist, CRNA and Surgeon  Anesthesia Plan Comments: (Patient consented for risks of anesthesia including but not limited to:  - adverse reactions to medications - damage to eyes, teeth, lips or other oral mucosa - nerve damage due to positioning  - sore throat or hoarseness - Damage to heart, brain, nerves, lungs, other parts of body or loss of life  Patient voiced understanding and assent.)       Anesthesia Quick Evaluation

## 2023-04-11 NOTE — Discharge Instructions (Signed)

## 2023-04-20 ENCOUNTER — Ambulatory Visit
Admission: RE | Admit: 2023-04-20 | Discharge: 2023-04-20 | Disposition: A | Discharge status: Home / Self Care | Payer: 59 | Attending: Ophthalmology | Admitting: Ophthalmology

## 2023-04-20 ENCOUNTER — Other Ambulatory Visit: Payer: Self-pay

## 2023-04-20 ENCOUNTER — Encounter
Admission: RE | Disposition: A | Discharge status: Home / Self Care | Payer: Self-pay | Source: Home / Self Care | Attending: Ophthalmology

## 2023-04-20 ENCOUNTER — Ambulatory Visit: Payer: 59 | Admitting: Anesthesiology

## 2023-04-20 ENCOUNTER — Encounter: Payer: Self-pay | Admitting: Ophthalmology

## 2023-04-20 DIAGNOSIS — H2512 Age-related nuclear cataract, left eye: Secondary | ICD-10-CM | POA: Diagnosis present

## 2023-04-20 DIAGNOSIS — Z7984 Long term (current) use of oral hypoglycemic drugs: Secondary | ICD-10-CM | POA: Diagnosis not present

## 2023-04-20 DIAGNOSIS — I2510 Atherosclerotic heart disease of native coronary artery without angina pectoris: Secondary | ICD-10-CM | POA: Insufficient documentation

## 2023-04-20 DIAGNOSIS — E119 Type 2 diabetes mellitus without complications: Secondary | ICD-10-CM | POA: Diagnosis not present

## 2023-04-20 DIAGNOSIS — I10 Essential (primary) hypertension: Secondary | ICD-10-CM | POA: Diagnosis not present

## 2023-04-20 DIAGNOSIS — E1136 Type 2 diabetes mellitus with diabetic cataract: Secondary | ICD-10-CM | POA: Insufficient documentation

## 2023-04-20 DIAGNOSIS — Z87891 Personal history of nicotine dependence: Secondary | ICD-10-CM | POA: Insufficient documentation

## 2023-04-20 DIAGNOSIS — J449 Chronic obstructive pulmonary disease, unspecified: Secondary | ICD-10-CM | POA: Diagnosis not present

## 2023-04-20 HISTORY — DX: Presence of external hearing-aid: Z97.4

## 2023-04-20 HISTORY — DX: Personal history of tuberculosis: Z86.11

## 2023-04-20 HISTORY — DX: Atherosclerotic heart disease of native coronary artery without angina pectoris: I25.10

## 2023-04-20 HISTORY — DX: Presence of dental prosthetic device (complete) (partial): Z97.2

## 2023-04-20 HISTORY — PX: CATARACT EXTRACTION W/PHACO: SHX586

## 2023-04-20 HISTORY — DX: Presence of coronary angioplasty implant and graft: Z95.5

## 2023-04-20 HISTORY — DX: Chronic obstructive pulmonary disease, unspecified: J44.9

## 2023-04-20 LAB — GLUCOSE, CAPILLARY: Glucose-Capillary: 106 mg/dL — ABNORMAL HIGH (ref 70–99)

## 2023-04-20 SURGERY — PHACOEMULSIFICATION, CATARACT, WITH IOL INSERTION
Anesthesia: Monitor Anesthesia Care | Laterality: Left

## 2023-04-20 MED ORDER — SIGHTPATH DOSE#1 BSS IO SOLN
INTRAOCULAR | Status: DC | PRN
  Administered 2023-04-20: 15 mL via INTRAOCULAR

## 2023-04-20 MED ORDER — BRIMONIDINE TARTRATE-TIMOLOL 0.2-0.5 % OP SOLN
OPHTHALMIC | Status: DC | PRN
  Administered 2023-04-20: 1 [drp] via OPHTHALMIC

## 2023-04-20 MED ORDER — LIDOCAINE HCL (PF) 2 % IJ SOLN
INTRAOCULAR | Status: DC | PRN
  Administered 2023-04-20: 1 mL via INTRAOCULAR

## 2023-04-20 MED ORDER — ALBUTEROL SULFATE HFA 108 (90 BASE) MCG/ACT IN AERS
INHALATION_SPRAY | RESPIRATORY_TRACT | Status: AC
  Filled 2023-04-20: qty 6.7

## 2023-04-20 MED ORDER — SIGHTPATH DOSE#1 BSS IO SOLN
INTRAOCULAR | Status: DC | PRN
  Administered 2023-04-20: 100 mL via OPHTHALMIC

## 2023-04-20 MED ORDER — ARMC OPHTHALMIC DILATING DROPS
OPHTHALMIC | Status: AC
  Filled 2023-04-20: qty 0.5

## 2023-04-20 MED ORDER — MIDAZOLAM HCL 2 MG/2ML IJ SOLN
INTRAMUSCULAR | Status: AC
  Filled 2023-04-20: qty 2

## 2023-04-20 MED ORDER — ONDANSETRON HCL 4 MG/2ML IJ SOLN
INTRAMUSCULAR | Status: AC
  Filled 2023-04-20: qty 2

## 2023-04-20 MED ORDER — MOXIFLOXACIN HCL 0.5 % OP SOLN
OPHTHALMIC | Status: DC | PRN
  Administered 2023-04-20: .2 mL via OPHTHALMIC

## 2023-04-20 MED ORDER — MIDAZOLAM HCL 2 MG/2ML IJ SOLN
INTRAMUSCULAR | Status: DC | PRN
  Administered 2023-04-20: .5 mg via INTRAVENOUS

## 2023-04-20 MED ORDER — SIGHTPATH DOSE#1 NA HYALUR & NA CHOND-NA HYALUR IO KIT
PACK | INTRAOCULAR | Status: DC | PRN
  Administered 2023-04-20: 1 via OPHTHALMIC

## 2023-04-20 MED ORDER — FENTANYL CITRATE (PF) 100 MCG/2ML IJ SOLN
INTRAMUSCULAR | Status: AC
  Filled 2023-04-20: qty 2

## 2023-04-20 MED ORDER — TETRACAINE HCL 0.5 % OP SOLN
1.0000 [drp] | OPHTHALMIC | Status: DC | PRN
  Administered 2023-04-20 (×3): 1 [drp] via OPHTHALMIC

## 2023-04-20 MED ORDER — IPRATROPIUM-ALBUTEROL 0.5-2.5 (3) MG/3ML IN SOLN
RESPIRATORY_TRACT | Status: AC
  Filled 2023-04-20: qty 3

## 2023-04-20 MED ORDER — TETRACAINE HCL 0.5 % OP SOLN
OPHTHALMIC | Status: AC
  Filled 2023-04-20: qty 4

## 2023-04-20 MED ORDER — ONDANSETRON HCL 4 MG/2ML IJ SOLN
4.0000 mg | Freq: Once | INTRAMUSCULAR | Status: AC
  Administered 2023-04-20: 4 mg via INTRAVENOUS

## 2023-04-20 MED ORDER — IPRATROPIUM-ALBUTEROL 0.5-2.5 (3) MG/3ML IN SOLN
3.0000 mL | Freq: Once | RESPIRATORY_TRACT | Status: AC
  Administered 2023-04-20: 3 mL via RESPIRATORY_TRACT

## 2023-04-20 MED ORDER — FENTANYL CITRATE (PF) 100 MCG/2ML IJ SOLN
INTRAMUSCULAR | Status: DC | PRN
  Administered 2023-04-20 (×2): 12.5 ug via INTRAVENOUS

## 2023-04-20 MED ORDER — ARMC OPHTHALMIC DILATING DROPS
1.0000 | OPHTHALMIC | Status: DC | PRN
  Administered 2023-04-20 (×3): 1 via OPHTHALMIC

## 2023-04-20 SURGICAL SUPPLY — 10 items
CATARACT SUITE SIGHTPATH (MISCELLANEOUS) ×1 IMPLANT
DISSECTOR HYDRO NUCLEUS 50X22 (MISCELLANEOUS) ×1 IMPLANT
DRSG TEGADERM 2-3/8X2-3/4 SM (GAUZE/BANDAGES/DRESSINGS) ×1 IMPLANT
FEE CATARACT SUITE SIGHTPATH (MISCELLANEOUS) ×1 IMPLANT
GLOVE SURG SYN 7.5 E (GLOVE) ×1 IMPLANT
GLOVE SURG SYN 7.5 PF PI (GLOVE) ×1 IMPLANT
GLOVE SURG SYN 8.5 E (GLOVE) ×1 IMPLANT
GLOVE SURG SYN 8.5 PF PI (GLOVE) ×1 IMPLANT
LENS IOL CLRN 22.0 (Intraocular Lens) IMPLANT
LENS IOL MONO CLAREON 22.0 (Intraocular Lens) ×1 IMPLANT

## 2023-04-20 NOTE — H&P (Signed)
[facility]   Primary Care Physician:  Vonna Kotyk, NP Ophthalmologist: Dr. Deberah Pelton  Pre-Procedure History & Physical: HPI:  Angelica Ferguson is a 89 y.o. female here for cataract surgery.   Past Medical History:  Diagnosis Date   Atrial fibrillation (HCC)    CAD (coronary artery disease)    COPD (chronic obstructive pulmonary disease) (HCC)    Diabetes mellitus without complication (HCC)    GERD (gastroesophageal reflux disease)    H/O TB (tuberculosis)    approx 1969.  Received treatment.   History of CVA in adulthood 2016   TIA - no deficits   History of heart artery stent    approx 2012   Hyperlipidemia    Hypertension    Wears dentures    full upper and lower   Wears hearing aid in both ears     Past Surgical History:  Procedure Laterality Date   CATARACT EXTRACTION Left    approx 2019   CORONARY/GRAFT ACUTE MI REVASCULARIZATION     approx 2012   HIP ARTHROPLASTY Right 11/19/2020   Procedure: ARTHROPLASTY BIPOLAR HIP (HEMIARTHROPLASTY);  Surgeon: Juanell Fairly, MD;  Location: [facility] ORS;  Service: Orthopedics;  Laterality: Right;   KNEE SURGERY Left     Prior to Admission medications   Medication Sig Start Date End Date Taking? Authorizing Provider  albuterol (VENTOLIN HFA) 108 (90 Base) MCG/ACT inhaler Inhale 1 puff into the lungs every 6 (six) hours as needed for wheezing or shortness of breath.   Yes [provider]  atorvastatin (LIPITOR) 80 MG tablet Take 1 tablet (80 mg total) by mouth every evening. 12/02/20  Yes Love, Evlyn Kanner, PA-C  beclomethasone (QVAR REDIHALER) 80 MCG/ACT inhaler Inhale 2 puffs into the lungs in the morning and at bedtime. 12/02/20  Yes Love, Evlyn Kanner, PA-C  benzonatate (TESSALON) 100 MG capsule Take 200 mg by mouth every 8 (eight) hours as needed for cough.   Yes [provider]  bisoprolol (ZEBETA) 5 MG tablet Take 1 tablet (5 mg total) by mouth 2 (two) times daily. 12/02/20  Yes Love, Evlyn Kanner,  PA-C  diltiazem (CARDIZEM) 60 MG tablet Take 1 tablet (60 mg total) by mouth 2 (two) times daily. 12/02/20  Yes Love, Evlyn Kanner, PA-C  edoxaban (SAVAYSA) 30 MG TABS tablet Take 1 tablet (30 mg total) by mouth daily. 12/02/20  Yes Love, Evlyn Kanner, PA-C  Glycopyrrolate-Formoterol (BEVESPI AEROSPHERE) 9-4.8 MCG/ACT AERO Inhale 2 puffs into the lungs 2 (two) times daily.   Yes [provider]  metFORMIN (GLUCOPHAGE) 500 MG tablet Take 500 mg by mouth every evening.   Yes [provider]    Allergies as of 03/01/2023   (No Known Allergies)    Family History  Problem Relation Age of Onset   Heart disease Sister    Cancer Sister     Social History   Socioeconomic History   Marital status: Widowed    Spouse name: Not on file   Number of children: Not on file   Years of education: Not on file   Highest education level: Not on file  Occupational History   Not on file  Tobacco Use   Smoking status: Former    Current packs/day: 0.00    Average packs/day: 0.5 packs/day for 15.0 years (7.5 ttl pk-yrs)    Types: Cigarettes    Start date: 73    Quit date: 24    Years since quitting: 54.0   Smokeless tobacco: Never  Vaping  Use   Vaping status: Never Used  Substance and Sexual Activity   Alcohol use: Not Currently    Comment: Rare   Drug use: Never   Sexual activity: Not on file  Other Topics Concern   Not on file  Social History Narrative   Not on file   Social Drivers of Health   Financial Resource Strain: Not on file  Food Insecurity: Not on file  Transportation Needs: Not on file  Physical Activity: Not on file  Stress: Not on file  Social Connections: Not on file  Intimate Partner Violence: Not on file    Review of Systems: See HPI, otherwise negative ROS  Physical Exam: Ht 5\' 5"  (1.651 m)   Wt 52.2 kg   BMI 19.14 kg/m  General:   Alert, cooperative in NAD Head:  Normocephalic and atraumatic. Respiratory:  Normal work of  breathing. Cardiovascular:  RRR  Impression/Plan: Angelica Ferguson is here for cataract surgery.  Risks, benefits, limitations, and alternatives regarding cataract surgery have been reviewed with the patient.  Questions have been answered.  All parties agreeable.   Estanislado Pandy, MD  04/20/2023, 9:05 AM

## 2023-04-20 NOTE — Transfer of Care (Signed)
Immediate Anesthesia Transfer of Care Note  Patient: [employer]  Procedure(s) Performed: CATARACT EXTRACTION PHACO AND INTRAOCULAR LENS PLACEMENT (IOC) LEFT DIABETIC 23.62 01:55.0 (Left)  Patient Location: PACU  Anesthesia Type: MAC  Level of Consciousness: awake, alert  and patient cooperative  Airway and Oxygen Therapy: Patient Spontanous Breathing and Patient connected to supplemental oxygen  Post-op Assessment: Post-op Vital signs reviewed, Patient's Cardiovascular Status Stable, Respiratory Function Stable, Patent Airway and No signs of Nausea or vomiting  Post-op Vital Signs: Reviewed and stable  Complications: No notable events documented.

## 2023-04-20 NOTE — Op Note (Signed)
OPERATIVE NOTE  Angelica Ferguson [id number] 04/20/2023   PREOPERATIVE DIAGNOSIS: Nuclear sclerotic cataract left eye. H25.12   POSTOPERATIVE DIAGNOSIS: Nuclear sclerotic cataract left eye. H25.12   PROCEDURE:  Phacoemusification with posterior chamber intraocular lens placement of the left eye  Ultrasound time: Procedure(s): CATARACT EXTRACTION PHACO AND INTRAOCULAR LENS PLACEMENT (IOC) LEFT DIABETIC 23.62 01:55.0 (Left)  LENS:   Implant Name Type Inv. Item Serial No. Manufacturer Lot No. LRB No. Used Action  LENS IOL MONO CLAREON 22.0 - [id number] Intraocular Lens LENS IOL MONO CLAREON 22.0 [id number] [employer]  Left 1 Implanted      SURGEON:  Julious Payer. Rolley Sims, MD   ANESTHESIA:  Topical with tetracaine drops, augmented with 1% preservative-free intracameral lidocaine.   COMPLICATIONS:  None.   DESCRIPTION OF PROCEDURE:  The patient was identified in the holding room and transported to the operating room and placed in the supine position under the operating microscope.  The left eye was identified as the operative eye, which was prepped and draped in the usual sterile ophthalmic fashion.   A 1 millimeter clear-corneal paracentesis was made inferotemporally. Preservative-free 1% lidocaine mixed with 1:1,000 bisulfite-free aqueous solution of epinephrine was injected into the anterior chamber. The anterior chamber was then filled with Viscoat viscoelastic. A 2.4 millimeter keratome was used to make a clear-corneal incision superotemporally. A curvilinear capsulorrhexis was made with a cystotome and capsulorrhexis forceps. Balanced salt solution was used to hydrodissect and hydrodelineate the nucleus. Phacoemulsification was then used to remove the lens nucleus and epinucleus. The remaining cortex was then removed using the irrigation and aspiration handpiece. Provisc was then placed into the capsular bag to distend it for lens placement. A +22.00 D [id number] intraocular lens was then injected  into the capsular bag. The remaining viscoelastic was aspirated.   Wounds were hydrated with balanced salt solution.  The anterior chamber was inflated to a physiologic pressure with balanced salt solution.  No wound leaks were noted. Vigamox was injected intracamerally.  Timolol and Brimonidine drops were applied to the eye.  The patient was taken to the recovery room in stable condition without complications of anesthesia or surgery.  [name] 04/20/2023, 3:51 PM

## 2023-04-21 ENCOUNTER — Encounter: Payer: Self-pay | Admitting: Ophthalmology

## 2023-04-21 NOTE — Anesthesia Postprocedure Evaluation (Signed)
Anesthesia Post Note  Patient: Angelica Ferguson  Procedure(s) Performed: CATARACT EXTRACTION PHACO AND INTRAOCULAR LENS PLACEMENT (IOC) LEFT DIABETIC 23.62 01:55.0 (Left)  Patient location during evaluation: PACU Anesthesia Type: MAC Level of consciousness: awake and alert Pain management: pain level controlled Vital Signs Assessment: post-procedure vital signs reviewed and stable Respiratory status: spontaneous breathing, nonlabored ventilation, respiratory function stable and patient connected to nasal cannula oxygen Cardiovascular status: stable and blood pressure returned to baseline Postop Assessment: no apparent nausea or vomiting Anesthetic complications: no   No notable events documented.   Last Vitals:  Vitals:   04/20/23 1554 04/20/23 1559  BP: (!) 131/100 (!) 148/99  Pulse: (!) 106   Resp: 14 19  Temp: (!) 36.3 C (!) 36.3 C  SpO2: 96% 96%    Last Pain:  Vitals:   04/20/23 1559  TempSrc:   PainSc: 0-No pain                 Caili Escalera C Fredrick Geoghegan

## 2023-05-05 ENCOUNTER — Encounter: Payer: Self-pay | Admitting: Pediatrics

## 2023-05-05 ENCOUNTER — Ambulatory Visit: Payer: 59 | Admitting: Pediatrics

## 2023-05-05 VITALS — BP 131/77 | HR 79 | Temp 98.9°F | Resp 14 | Ht 65.0 in | Wt 114.0 lb

## 2023-05-05 DIAGNOSIS — Z1330 Encounter for screening examination for mental health and behavioral disorders, unspecified: Secondary | ICD-10-CM | POA: Diagnosis not present

## 2023-05-05 DIAGNOSIS — Z7689 Persons encountering health services in other specified circumstances: Secondary | ICD-10-CM | POA: Diagnosis not present

## 2023-05-05 DIAGNOSIS — J449 Chronic obstructive pulmonary disease, unspecified: Secondary | ICD-10-CM | POA: Diagnosis not present

## 2023-05-05 DIAGNOSIS — I4891 Unspecified atrial fibrillation: Secondary | ICD-10-CM | POA: Diagnosis not present

## 2023-05-05 MED ORDER — AEROCHAMBER PLUS FLO-VU SMALL MISC: 1.0000 | Freq: Once | 0 refills | Status: AC

## 2023-05-05 NOTE — Progress Notes (Signed)
 Office Visit  BP 131/77 (BP Location: Left Arm, Patient Position: Sitting, Cuff Size: Normal)   Pulse 79   Temp 98.9 F (37.2 C) (Oral)   Resp 14   Ht 5' 5 (1.651 m)   Wt 114 lb (51.7 kg)   SpO2 95%   BMI 18.97 kg/m    Subjective:    Patient ID: Angelica Ferguson, female    DOB: 1932-07-29, 89 y.o.   MRN: [id number]  HPI: Angelica Ferguson is a 89 y.o. female  Chief Complaint  Patient presents with   Establish Care   COPD    Concerned that one of the inhalers might not be the best choice. If she needs PFT's she will refuse and just continue as is.     Discussed the use of AI scribe software for clinical note transcription with the patient, who gave verbal consent to proceed.  History of Present Illness   The patient, an 89 year old with a history of COPD, presents with an increase in coughing and sputum production over the past few weeks. The patient's COPD was diagnosed in 2005, attributed to a history of smoking, which ceased in her 30s. The patient's symptoms are managed with two inhalers, Qvar  and another unspecified, taken twice daily. Despite adherence to this regimen, the patient has experienced breakthrough symptoms, necessitating the use of Tessalon  Perles three to four times a week for relief and improved sleep. The patient's oxygen saturation typically ranges from 92-93%, but recently dropped to 88% during a COPD flare-up, which was managed with a course of antibiotics and steroids.  The patient's other significant medical history includes atrial fibrillation, managed with diltiazem , and a recent diagnosis of slight anemia, which is currently being addressed through dietary changes. The patient lives with her daughter, who assists with medication management and monitors the patient's vitals daily. The patient maintains an active lifestyle, attempting to go out for walks five times a week.  The patient's daughter has expressed interest in potentially changing the patient's  COPD management to Trelegy, an inhaler she herself uses for asthma and has found effective. The patient's daughter also mentioned the possibility of considering home oxygen therapy, as the patient previously used oxygen after a hospital stay but has not used it since. The patient's daughter also keeps a supply of rescue steroids and antibiotics on hand to manage any COPD flare-ups promptly.      Relevant past medical, surgical, family and social history reviewed and updated as indicated. Interim medical history since our last visit reviewed. Allergies and medications reviewed and updated.  ROS per HPI unless specifically indicated above     Objective:    BP 131/77 (BP Location: Left Arm, Patient Position: Sitting, Cuff Size: Normal)   Pulse 79   Temp 98.9 F (37.2 C) (Oral)   Resp 14   Ht 5' 5 (1.651 m)   Wt 114 lb (51.7 kg)   SpO2 95%   BMI 18.97 kg/m   Wt Readings from Last 3 Encounters:  05/05/23 114 lb (51.7 kg)  04/20/23 115 lb 4.8 oz (52.3 kg)  11/23/20 121 lb 14.6 oz (55.3 kg)     Physical Exam Constitutional:      Appearance: Normal appearance.  HENT:     Head: Normocephalic and atraumatic.  Eyes:     Pupils: Pupils are equal, round, and reactive to light.  Cardiovascular:     Rate and Rhythm: Normal rate and regular rhythm.     Pulses: Normal pulses.  Heart sounds: Normal heart sounds.  Pulmonary:     Effort: Pulmonary effort is normal.     Breath sounds: Normal breath sounds.     Comments: Some inspiratory wheezing in upper lung fields bilaterally cleared with deep breathing Musculoskeletal:        General: Normal range of motion.     Cervical back: Normal range of motion.  Skin:    General: Skin is warm and dry.     Capillary Refill: Capillary refill takes less than 2 seconds.  Neurological:     General: No focal deficit present.     Mental Status: She is alert. Mental status is at baseline.  Psychiatric:        Mood and Affect: Mood normal.         Behavior: Behavior normal.         05/05/2023   12:39 PM  Depression screen PHQ 2/9  Decreased Interest 0  Down, Depressed, Hopeless 0  PHQ - 2 Score 0  Altered sleeping 1  Tired, decreased energy 0  Change in appetite 0  Feeling bad or failure about yourself  0  Trouble concentrating 0  Moving slowly or fidgety/restless 0  PHQ-9 Score 1  Difficult doing work/chores Not difficult at all       05/05/2023   12:40 PM  GAD 7 : Generalized Anxiety Score  Nervous, Anxious, on Edge 0  Control/stop worrying 0  Worry too much - different things 0  Trouble relaxing 0  Restless 0  Easily annoyed or irritable 0  Afraid - awful might happen 0  Total GAD 7 Score 0  Anxiety Difficulty Not difficult at all       Assessment & Plan:  Assessment & Plan   Chronic obstructive pulmonary disease, unspecified COPD type (HCC) Assessment & Plan: Increased coughing and sputum production. Recent exacerbation managed with antibiotics and steroids. Currently on two daily inhalers (Qvar  and bevespi ), rescue w albuterol , and Tessalon  Perles for cough. Discussed the benefits of consolidating medications with Trelegy. Consider oxygen in the future if satting <88% at rest. -Sample of trelegy given today, will send rx if working well to manage symptoms. -If approved, start Trelegy once daily and continue Albuterol  as needed. -Sent rx for spacer to ensure medication delivery of rescue inhaler. -Check in 1 week via mychart to assess response to Trelegy  Orders: -     AeroChamber Plus Flo-Vu Small; 1 each by Other route once for 1 dose.  Dispense: 1 each; Refill: 0  Atrial fibrillation, unspecified type [facility]) Assessment & Plan: Well controlled on Diltiazem  twice daily. Follows with cardiology. -Continue current management.   Encounter to establish care Reviewed available patient record including history, medications, problem list. HM updated as able. Will review and/or request outside records (if  applicable) and will fill remaining HM gaps as needed at follow up visit.  Encounter for behavioral health screening As part of their intake evaluation, the patient was screened for depression, anxiety.  PHQ9 SCORE 1, GAD7 SCORE 0. Screening results negative for tested conditions. Continue to monitor.  Follow up plan: Return in about 3 months (around 08/03/2023) for copd, Physical.  Salia Cangemi SHAUNNA NETT, MD

## 2023-05-05 NOTE — Patient Instructions (Addendum)
 Spacer for albuterol  Try trelegy for 1 week then message me how it is going

## 2023-05-08 ENCOUNTER — Encounter: Payer: Self-pay | Admitting: Pediatrics

## 2023-05-08 NOTE — Assessment & Plan Note (Signed)
 Well controlled on Diltiazem twice daily. Follows with cardiology. -Continue current management.

## 2023-05-08 NOTE — Assessment & Plan Note (Signed)
 Increased coughing and sputum production. Recent exacerbation managed with antibiotics and steroids. Currently on two inhalers (Qvar  and another unspecified) and Tessalon  Perles for cough. Discussed the benefits of consolidating medications with Trelegy. Consider oxygen in the future if satting <88% at rest. -Sample of trelegy given today, will send rx if working well to manage symptoms. -If approved, start Trelegy once daily and continue Albuterol  as needed. -Sent rx for spacer to ensure medication delivery of rescue inhaler. -Check in 1 week via mychart to assess response to Trelegy

## 2023-05-10 ENCOUNTER — Other Ambulatory Visit: Payer: Self-pay | Admitting: Pediatrics

## 2023-05-10 DIAGNOSIS — J449 Chronic obstructive pulmonary disease, unspecified: Secondary | ICD-10-CM

## 2023-05-10 NOTE — Telephone Encounter (Signed)
 Generally oxygen can be set up at home within 24 hours of ordering and doesn't have nay way to start the insurance piece ahead of time. Also looks like maybe a PA check on Trelegy but what dose, 100 or 200?

## 2023-05-11 MED ORDER — TRELEGY ELLIPTA 100-62.5-25 MCG/ACT IN AEPB: 1.0000 | INHALATION_SPRAY | Freq: Every day | RESPIRATORY_TRACT | 11 refills | Status: DC

## 2023-05-11 NOTE — Progress Notes (Signed)
Sent trelegy prescription, low dose for COPD  Angelica Faust Howell Pringle, MD

## 2023-05-22 ENCOUNTER — Emergency Department: Payer: 59

## 2023-05-22 ENCOUNTER — Encounter: Payer: Self-pay | Admitting: Emergency Medicine

## 2023-05-22 ENCOUNTER — Inpatient Hospital Stay
Admission: EM | Admit: 2023-05-22 | Discharge: 2023-05-26 | DRG: 291 | Disposition: A | Discharge status: Home Health | Payer: 59 | Attending: Internal Medicine | Admitting: Internal Medicine

## 2023-05-22 ENCOUNTER — Other Ambulatory Visit: Payer: Self-pay

## 2023-05-22 DIAGNOSIS — Z955 Presence of coronary angioplasty implant and graft: Secondary | ICD-10-CM

## 2023-05-22 DIAGNOSIS — J9601 Acute respiratory failure with hypoxia: Secondary | ICD-10-CM | POA: Diagnosis not present

## 2023-05-22 DIAGNOSIS — I509 Heart failure, unspecified: Secondary | ICD-10-CM

## 2023-05-22 DIAGNOSIS — R54 Age-related physical debility: Secondary | ICD-10-CM | POA: Diagnosis present

## 2023-05-22 DIAGNOSIS — I4821 Permanent atrial fibrillation: Secondary | ICD-10-CM | POA: Diagnosis present

## 2023-05-22 DIAGNOSIS — E119 Type 2 diabetes mellitus without complications: Secondary | ICD-10-CM

## 2023-05-22 DIAGNOSIS — D508 Other iron deficiency anemias: Secondary | ICD-10-CM | POA: Diagnosis not present

## 2023-05-22 DIAGNOSIS — I4891 Unspecified atrial fibrillation: Principal | ICD-10-CM

## 2023-05-22 DIAGNOSIS — I130 Hypertensive heart and chronic kidney disease with heart failure and stage 1 through stage 4 chronic kidney disease, or unspecified chronic kidney disease: Secondary | ICD-10-CM | POA: Diagnosis present

## 2023-05-22 DIAGNOSIS — J441 Chronic obstructive pulmonary disease with (acute) exacerbation: Secondary | ICD-10-CM | POA: Diagnosis present

## 2023-05-22 DIAGNOSIS — E1165 Type 2 diabetes mellitus with hyperglycemia: Secondary | ICD-10-CM | POA: Diagnosis present

## 2023-05-22 DIAGNOSIS — R000 Tachycardia, unspecified: Secondary | ICD-10-CM | POA: Diagnosis present

## 2023-05-22 DIAGNOSIS — R0602 Shortness of breath: Secondary | ICD-10-CM | POA: Diagnosis present

## 2023-05-22 DIAGNOSIS — K219 Gastro-esophageal reflux disease without esophagitis: Secondary | ICD-10-CM | POA: Insufficient documentation

## 2023-05-22 DIAGNOSIS — I2510 Atherosclerotic heart disease of native coronary artery without angina pectoris: Secondary | ICD-10-CM | POA: Diagnosis present

## 2023-05-22 DIAGNOSIS — Z8249 Family history of ischemic heart disease and other diseases of the circulatory system: Secondary | ICD-10-CM

## 2023-05-22 DIAGNOSIS — Z8611 Personal history of tuberculosis: Secondary | ICD-10-CM

## 2023-05-22 DIAGNOSIS — I10 Essential (primary) hypertension: Secondary | ICD-10-CM | POA: Diagnosis present

## 2023-05-22 DIAGNOSIS — E785 Hyperlipidemia, unspecified: Secondary | ICD-10-CM | POA: Diagnosis present

## 2023-05-22 DIAGNOSIS — I2489 Other forms of acute ischemic heart disease: Secondary | ICD-10-CM | POA: Diagnosis present

## 2023-05-22 DIAGNOSIS — I5021 Acute systolic (congestive) heart failure: Secondary | ICD-10-CM | POA: Diagnosis not present

## 2023-05-22 DIAGNOSIS — I5A Non-ischemic myocardial injury (non-traumatic): Secondary | ICD-10-CM | POA: Insufficient documentation

## 2023-05-22 DIAGNOSIS — Z7984 Long term (current) use of oral hypoglycemic drugs: Secondary | ICD-10-CM | POA: Diagnosis not present

## 2023-05-22 DIAGNOSIS — I5023 Acute on chronic systolic (congestive) heart failure: Secondary | ICD-10-CM | POA: Diagnosis present

## 2023-05-22 DIAGNOSIS — Z79899 Other long term (current) drug therapy: Secondary | ICD-10-CM | POA: Diagnosis not present

## 2023-05-22 DIAGNOSIS — Z87891 Personal history of nicotine dependence: Secondary | ICD-10-CM | POA: Diagnosis not present

## 2023-05-22 DIAGNOSIS — J810 Acute pulmonary edema: Secondary | ICD-10-CM | POA: Diagnosis not present

## 2023-05-22 DIAGNOSIS — D509 Iron deficiency anemia, unspecified: Secondary | ICD-10-CM

## 2023-05-22 DIAGNOSIS — T380X5A Adverse effect of glucocorticoids and synthetic analogues, initial encounter: Secondary | ICD-10-CM | POA: Diagnosis present

## 2023-05-22 DIAGNOSIS — J9621 Acute and chronic respiratory failure with hypoxia: Secondary | ICD-10-CM | POA: Diagnosis present

## 2023-05-22 DIAGNOSIS — R7989 Other specified abnormal findings of blood chemistry: Secondary | ICD-10-CM

## 2023-05-22 DIAGNOSIS — Z8673 Personal history of transient ischemic attack (TIA), and cerebral infarction without residual deficits: Secondary | ICD-10-CM

## 2023-05-22 DIAGNOSIS — Z8701 Personal history of pneumonia (recurrent): Secondary | ICD-10-CM

## 2023-05-22 DIAGNOSIS — I5031 Acute diastolic (congestive) heart failure: Secondary | ICD-10-CM | POA: Diagnosis not present

## 2023-05-22 DIAGNOSIS — Z96641 Presence of right artificial hip joint: Secondary | ICD-10-CM | POA: Diagnosis present

## 2023-05-22 DIAGNOSIS — N1831 Chronic kidney disease, stage 3a: Secondary | ICD-10-CM | POA: Diagnosis present

## 2023-05-22 DIAGNOSIS — I5033 Acute on chronic diastolic (congestive) heart failure: Secondary | ICD-10-CM | POA: Diagnosis not present

## 2023-05-22 DIAGNOSIS — Z974 Presence of external hearing-aid: Secondary | ICD-10-CM

## 2023-05-22 DIAGNOSIS — Z794 Long term (current) use of insulin: Secondary | ICD-10-CM | POA: Diagnosis not present

## 2023-05-22 DIAGNOSIS — E1122 Type 2 diabetes mellitus with diabetic chronic kidney disease: Secondary | ICD-10-CM | POA: Diagnosis present

## 2023-05-22 DIAGNOSIS — I420 Dilated cardiomyopathy: Secondary | ICD-10-CM | POA: Diagnosis present

## 2023-05-22 DIAGNOSIS — Z7951 Long term (current) use of inhaled steroids: Secondary | ICD-10-CM

## 2023-05-22 DIAGNOSIS — J449 Chronic obstructive pulmonary disease, unspecified: Secondary | ICD-10-CM

## 2023-05-22 DIAGNOSIS — Z882 Allergy status to sulfonamides status: Secondary | ICD-10-CM

## 2023-05-22 LAB — CBC WITH DIFFERENTIAL/PLATELET
Abs Immature Granulocytes: 0.16 10*3/uL — ABNORMAL HIGH (ref 0.00–0.07)
Basophils Absolute: 0 10*3/uL (ref 0.0–0.1)
Basophils Relative: 0 %
Eosinophils Absolute: 0 10*3/uL (ref 0.0–0.5)
Eosinophils Relative: 0 %
HCT: 34 % — ABNORMAL LOW (ref 36.0–46.0)
Hemoglobin: 9.8 g/dL — ABNORMAL LOW (ref 12.0–15.0)
Immature Granulocytes: 1 %
Lymphocytes Relative: 4 %
Lymphs Abs: 0.8 10*3/uL (ref 0.7–4.0)
MCH: 22.5 pg — ABNORMAL LOW (ref 26.0–34.0)
MCHC: 28.8 g/dL — ABNORMAL LOW (ref 30.0–36.0)
MCV: 78 fL — ABNORMAL LOW (ref 80.0–100.0)
Monocytes Absolute: 1.5 10*3/uL — ABNORMAL HIGH (ref 0.1–1.0)
Monocytes Relative: 7 %
Neutro Abs: 20.4 10*3/uL — ABNORMAL HIGH (ref 1.7–7.7)
Neutrophils Relative %: 88 %
Platelets: 518 10*3/uL — ABNORMAL HIGH (ref 150–400)
RBC: 4.36 MIL/uL (ref 3.87–5.11)
RDW: 18.4 % — ABNORMAL HIGH (ref 11.5–15.5)
WBC: 23 10*3/uL — ABNORMAL HIGH (ref 4.0–10.5)
nRBC: 0.3 % — ABNORMAL HIGH (ref 0.0–0.2)

## 2023-05-22 LAB — OSMOLALITY: Osmolality: 310 mosm/kg — ABNORMAL HIGH (ref 275–295)

## 2023-05-22 LAB — BASIC METABOLIC PANEL
Anion gap: 11 (ref 5–15)
Anion gap: 16 — ABNORMAL HIGH (ref 5–15)
BUN: 45 mg/dL — ABNORMAL HIGH (ref 8–23)
BUN: 49 mg/dL — ABNORMAL HIGH (ref 8–23)
CO2: 22 mmol/L (ref 22–32)
CO2: 21 mmol/L — ABNORMAL LOW (ref 22–32)
Calcium: 8.2 mg/dL — ABNORMAL LOW (ref 8.9–10.3)
Calcium: 8.7 mg/dL — ABNORMAL LOW (ref 8.9–10.3)
Chloride: 85 mmol/L — ABNORMAL LOW (ref 98–111)
Chloride: 98 mmol/L (ref 98–111)
Creatinine, Ser: 1.11 mg/dL — ABNORMAL HIGH (ref 0.44–1.00)
Creatinine, Ser: 1.17 mg/dL — ABNORMAL HIGH (ref 0.44–1.00)
GFR, Estimated: 48 mL/min — ABNORMAL LOW (ref >60)
GFR, Estimated: 45 mL/min — ABNORMAL LOW (ref >60)
Glucose, Bld: 669 mg/dL (ref 70–99)
Glucose, Bld: 194 mg/dL — ABNORMAL HIGH (ref 70–99)
Potassium: 3.7 mmol/L (ref 3.5–5.1)
Potassium: 4.7 mmol/L (ref 3.5–5.1)
Sodium: 118 mmol/L — CL (ref 135–145)
Sodium: 135 mmol/L (ref 135–145)

## 2023-05-22 LAB — RESP PANEL BY RT-PCR (RSV, FLU A&B, COVID)  RVPGX2
Influenza A by PCR: NEGATIVE
Influenza B by PCR: NEGATIVE
Resp Syncytial Virus by PCR: NEGATIVE
SARS Coronavirus 2 by RT PCR: NEGATIVE

## 2023-05-22 LAB — TROPONIN I (HIGH SENSITIVITY)
Troponin I (High Sensitivity): 143 ng/L (ref <18)
Troponin I (High Sensitivity): 143 ng/L (ref <18)

## 2023-05-22 LAB — MAGNESIUM: Magnesium: 3.1 mg/dL — ABNORMAL HIGH (ref 1.7–2.4)

## 2023-05-22 LAB — RETICULOCYTES
Immature Retic Fract: 28.4 % — ABNORMAL HIGH (ref 2.3–15.9)
RBC.: 4.59 MIL/uL (ref 3.87–5.11)
Retic Count, Absolute: 127.1 10*3/uL (ref 19.0–186.0)
Retic Ct Pct: 2.8 % (ref 0.4–3.1)

## 2023-05-22 LAB — BRAIN NATRIURETIC PEPTIDE: B Natriuretic Peptide: 1759.2 pg/mL — ABNORMAL HIGH (ref 0.0–100.0)

## 2023-05-22 LAB — IRON AND TIBC
Iron: 25 ug/dL — ABNORMAL LOW (ref 28–170)
Saturation Ratios: 5 % — ABNORMAL LOW (ref 10.4–31.8)
TIBC: 480 ug/dL — ABNORMAL HIGH (ref 250–450)
UIBC: 455 ug/dL

## 2023-05-22 LAB — TSH: TSH: 0.887 u[IU]/mL (ref 0.350–4.500)

## 2023-05-22 LAB — LACTIC ACID, PLASMA
Lactic Acid, Venous: 2.1 mmol/L (ref 0.5–1.9)
Lactic Acid, Venous: 1.6 mmol/L (ref 0.5–1.9)

## 2023-05-22 LAB — CBG MONITORING, ED: Glucose-Capillary: 236 mg/dL — ABNORMAL HIGH (ref 70–99)

## 2023-05-22 LAB — FERRITIN: Ferritin: 23 ng/mL (ref 11–307)

## 2023-05-22 LAB — PHOSPHORUS: Phosphorus: 4.8 mg/dL — ABNORMAL HIGH (ref 2.5–4.6)

## 2023-05-22 LAB — PROCALCITONIN: Procalcitonin: <0.1 ng/mL

## 2023-05-22 MED ORDER — LEVALBUTEROL HCL 0.63 MG/3ML IN NEBU: 0.6300 mg | INHALATION_SOLUTION | Freq: Four times a day (QID) | RESPIRATORY_TRACT | Status: DC | PRN

## 2023-05-22 MED ORDER — FLUTICASONE FUROATE-VILANTEROL 100-25 MCG/ACT IN AEPB
1.0000 | INHALATION_SPRAY | Freq: Every day | RESPIRATORY_TRACT | Status: DC
  Administered 2023-05-23 – 2023-05-26 (×4): 1 via RESPIRATORY_TRACT
  Filled 2023-05-22: qty 28

## 2023-05-22 MED ORDER — IOHEXOL 350 MG/ML SOLN
75.0000 mL | Freq: Once | INTRAVENOUS | Status: AC | PRN
  Administered 2023-05-22: 75 mL via INTRAVENOUS

## 2023-05-22 MED ORDER — ACETAMINOPHEN 325 MG PO TABS
650.0000 mg | ORAL_TABLET | ORAL | Status: DC | PRN
Start: 2023-05-22 — End: 2023-05-26

## 2023-05-22 MED ORDER — SODIUM CHLORIDE 0.9 % IV SOLN: 250.0000 mL | INTRAVENOUS | Status: AC | PRN

## 2023-05-22 MED ORDER — DILTIAZEM HCL-DEXTROSE 125-5 MG/125ML-% IV SOLN (PREMIX)
5.0000 mg/h | INTRAVENOUS | Status: DC
Start: 2023-05-22 — End: 2023-05-22

## 2023-05-22 MED ORDER — DILTIAZEM HCL 25 MG/5ML IV SOLN
10.0000 mg | Freq: Once | INTRAVENOUS | Status: AC
  Administered 2023-05-22: 10 mg via INTRAVENOUS
  Filled 2023-05-22: qty 5

## 2023-05-22 MED ORDER — ONDANSETRON HCL 4 MG/2ML IJ SOLN: 4.0000 mg | Freq: Four times a day (QID) | INTRAMUSCULAR | Status: DC | PRN

## 2023-05-22 MED ORDER — ATORVASTATIN CALCIUM 20 MG PO TABS
40.0000 mg | ORAL_TABLET | Freq: Every day | ORAL | Status: DC
  Administered 2023-05-22 – 2023-05-25 (×4): 40 mg via ORAL
  Filled 2023-05-22 (×4): qty 2

## 2023-05-22 MED ORDER — METOPROLOL TARTRATE 5 MG/5ML IV SOLN: 5.0000 mg | INTRAVENOUS | Status: DC | PRN

## 2023-05-22 MED ORDER — INSULIN ASPART 100 UNIT/ML IJ SOLN
0.0000 [IU] | Freq: Three times a day (TID) | INTRAMUSCULAR | Status: DC
  Administered 2023-05-22: 5 [IU] via SUBCUTANEOUS
  Administered 2023-05-23: 2 [IU] via SUBCUTANEOUS
  Administered 2023-05-23: 3 [IU] via SUBCUTANEOUS
  Administered 2023-05-24: 5 [IU] via SUBCUTANEOUS
  Administered 2023-05-24 (×2): 3 [IU] via SUBCUTANEOUS
  Administered 2023-05-25: 2 [IU] via SUBCUTANEOUS
  Administered 2023-05-25: 5 [IU] via SUBCUTANEOUS
  Administered 2023-05-25: 2 [IU] via SUBCUTANEOUS
  Administered 2023-05-26: 5 [IU] via SUBCUTANEOUS
  Administered 2023-05-26: 2 [IU] via SUBCUTANEOUS
  Filled 2023-05-22 (×11): qty 1

## 2023-05-22 MED ORDER — SODIUM CHLORIDE 0.9 % IV SOLN
1.0000 g | Freq: Once | INTRAVENOUS | Status: AC
  Administered 2023-05-22: 1 g via INTRAVENOUS
  Filled 2023-05-22: qty 10

## 2023-05-22 MED ORDER — INSULIN ASPART 100 UNIT/ML IJ SOLN
0.0000 [IU] | Freq: Every day | INTRAMUSCULAR | Status: DC
  Filled 2023-05-22: qty 1

## 2023-05-22 MED ORDER — UMECLIDINIUM BROMIDE 62.5 MCG/ACT IN AEPB
1.0000 | INHALATION_SPRAY | Freq: Every day | RESPIRATORY_TRACT | Status: DC
  Administered 2023-05-23 – 2023-05-26 (×4): 1 via RESPIRATORY_TRACT
  Filled 2023-05-22: qty 7

## 2023-05-22 MED ORDER — DIGOXIN 0.25 MG/ML IJ SOLN
0.1250 mg | Freq: Four times a day (QID) | INTRAMUSCULAR | Status: AC
  Administered 2023-05-22 – 2023-05-23 (×3): 0.125 mg via INTRAVENOUS
  Filled 2023-05-22 (×5): qty 2

## 2023-05-22 MED ORDER — SODIUM CHLORIDE 0.9 % IV SOLN
500.0000 mg | Freq: Once | INTRAVENOUS | Status: AC
  Administered 2023-05-22: 500 mg via INTRAVENOUS
  Filled 2023-05-22: qty 5

## 2023-05-22 MED ORDER — BENZONATATE 100 MG PO CAPS: 200.0000 mg | ORAL_CAPSULE | Freq: Three times a day (TID) | ORAL | Status: DC | PRN

## 2023-05-22 MED ORDER — SODIUM CHLORIDE 0.9% FLUSH: 3.0000 mL | INTRAVENOUS | Status: DC | PRN

## 2023-05-22 MED ORDER — METHYLPREDNISOLONE SODIUM SUCC 40 MG IJ SOLR: 40.0000 mg | Freq: Two times a day (BID) | INTRAMUSCULAR | Status: DC

## 2023-05-22 MED ORDER — BISOPROLOL FUMARATE 5 MG PO TABS
5.0000 mg | ORAL_TABLET | Freq: Two times a day (BID) | ORAL | Status: DC
  Administered 2023-05-22: 5 mg via ORAL
  Filled 2023-05-22 (×3): qty 1

## 2023-05-22 MED ORDER — SODIUM CHLORIDE 0.9% FLUSH
3.0000 mL | Freq: Two times a day (BID) | INTRAVENOUS | Status: DC
  Administered 2023-05-22 – 2023-05-26 (×8): 3 mL via INTRAVENOUS

## 2023-05-22 MED ORDER — POTASSIUM CHLORIDE CRYS ER 20 MEQ PO TBCR
40.0000 meq | EXTENDED_RELEASE_TABLET | Freq: Once | ORAL | Status: AC
Start: 2023-05-22 — End: 2023-05-22
  Administered 2023-05-22: 40 meq via ORAL
  Filled 2023-05-22: qty 2

## 2023-05-22 MED ORDER — METHYLPREDNISOLONE SODIUM SUCC 125 MG IJ SOLR
80.0000 mg | INTRAMUSCULAR | Status: DC
  Administered 2023-05-22 – 2023-05-23 (×2): 80 mg via INTRAVENOUS
  Filled 2023-05-22 (×2): qty 2

## 2023-05-22 MED ORDER — BISOPROLOL FUMARATE 5 MG PO TABS
2.5000 mg | ORAL_TABLET | Freq: Every day | ORAL | Status: DC
  Administered 2023-05-23: 2.5 mg via ORAL
  Filled 2023-05-22 (×2): qty 0.5

## 2023-05-22 MED ORDER — ASPIRIN 81 MG PO CHEW
324.0000 mg | CHEWABLE_TABLET | Freq: Once | ORAL | Status: AC
  Administered 2023-05-22: 324 mg via ORAL
  Filled 2023-05-22: qty 4

## 2023-05-22 MED ORDER — SODIUM CHLORIDE 0.9 % IV BOLUS
1000.0000 mL | Freq: Once | INTRAVENOUS | Status: AC
  Administered 2023-05-22: 1000 mL via INTRAVENOUS

## 2023-05-22 MED ORDER — IPRATROPIUM-ALBUTEROL 0.5-2.5 (3) MG/3ML IN SOLN
3.0000 mL | Freq: Once | RESPIRATORY_TRACT | Status: AC
  Administered 2023-05-22: 3 mL via RESPIRATORY_TRACT
  Filled 2023-05-22: qty 3

## 2023-05-22 MED ORDER — FUROSEMIDE 10 MG/ML IJ SOLN
60.0000 mg | Freq: Once | INTRAMUSCULAR | Status: AC
  Administered 2023-05-22: 60 mg via INTRAVENOUS
  Filled 2023-05-22: qty 8

## 2023-05-22 MED ORDER — IPRATROPIUM BROMIDE 0.02 % IN SOLN
0.5000 mg | Freq: Four times a day (QID) | RESPIRATORY_TRACT | Status: DC
  Administered 2023-05-22 – 2023-05-23 (×6): 0.5 mg via RESPIRATORY_TRACT
  Filled 2023-05-22 (×6): qty 2.5

## 2023-05-22 MED ORDER — EDOXABAN TOSYLATE 30 MG PO TABS
30.0000 mg | ORAL_TABLET | Freq: Every day | ORAL | Status: DC
Start: 2023-05-23 — End: 2023-05-26
  Administered 2023-05-23 – 2023-05-26 (×4): 30 mg via ORAL
  Filled 2023-05-22 (×4): qty 1

## 2023-05-22 NOTE — ED Notes (Signed)
Pt resting at this time. Pt does have increased WOB by looking at pt. Pt placed on cardiac monitor, pt placed on purewick due to DOE just with rolling. Warm blanket provided. Daughter at bedside.

## 2023-05-22 NOTE — ED Triage Notes (Signed)
Presents via EMS with SOB  States she has been increasing SOB   recently started on prednisone last week   On arrival resp very labored  20 g left ac 20 in right wrist Solumederol 125 Duo nebs time3  2 gm of mag Cardizem drip at 5 mg

## 2023-05-22 NOTE — ED Notes (Addendum)
Daughter stopped by RN station to ensure phone number in chart as she is leaving for the night and pharmacy hadn't been around to verify pt's medications.

## 2023-05-22 NOTE — H&P (Signed)
History and Physical    Angelica Ferguson ZOX:[id number] DOB: 1932-11-28 DOA: 05/22/2023  PCP: Jackolyn Confer, MD (Confirm with patient/family/NH records and if not entered, this has to be entered at [facility] point of entry) Patient coming from: Home  I have personally briefly reviewed patient's old medical records in [facility]  Chief Complaint: Cough, wheezing, SOB  HPI: Angelica Ferguson is a 89 y.o. female with medical history significant of PAF, COPD, CAD status post stenting, IIDM, brought in by family members for evaluation of worsening of cough wheezing shortness of breath.  Patient started develop dry cough wheezing shortness of breath about 1 week ago.  Went to see PCP when she was diagnosed with COPD exacerbation and started on doxycycline) to 6 days ago.  Along with as needed inhalers and Tessalon.  Despite all above medications patient continued to have cough wheezing shortness of breath, denied any chest pain no fever or chills.  Last 2 days she also started to see " fluid buildup" in bilateral ankles.  Daughter at bedside reported that last 2 days, a wearable telemonitoring device on patient's arm continue to show " A-fib" which is new.  As compared to baseline patient has rate controlled A-fib usually heart rate in the 70-80 range.  ED Course: Afebrile, tachycardia heart rate 130-150s EKG showed A-fib blood pressure 148/69 O2 saturation 98% on room air.  Chest x-ray showed pulmonary congestion.  CTA showed negative for PE but bilateral multifocal infiltrates considered to be CHF versus multifocal pneumonia.  Blood work showed glucose 69, 1.1, BUN 45, sodium 118, chloride 85.  Patient was given IV Cardizem and started on a drip, and was given IV Lasix 60 mg x 1 in the ED.  Review of Systems: As per HPI otherwise 14 point review of systems negative.    Past Medical History:  Diagnosis Date   Acute blood loss anemia 12/04/2020   Acute renal failure (HCC) 12/04/2020   Allergic  rhinitis 02/20/2023   Atrial fibrillation (HCC)    CAD (coronary artery disease)    Cerebrovascular accident (HCC) 04/26/2015   Chronic kidney disease, stage 2 (mild) 03/30/2023   Closed fracture of right hip requiring operative repair, sequela 11/23/2020   COPD (chronic obstructive pulmonary disease) (HCC)    Coronary stent patent 06/19/2020   Diabetes mellitus without complication (HCC)    Former light tobacco smoker 06/22/2021   GERD (gastroesophageal reflux disease)    H/O TB (tuberculosis)    approx 1969.  Received treatment.   Hip fracture (HCC) 11/16/2020   History of CVA in adulthood 2016   TIA - no deficits   History of heart artery stent    approx 2012   Hyperlipidemia    Hypertension    Malnutrition of moderate degree 11/18/2020   Normocytic anemia 11/03/2022   Pressure injury of skin 11/24/2020   Wears dentures    full upper and lower   Wears hearing aid in both ears     Past Surgical History:  Procedure Laterality Date   CATARACT EXTRACTION Left    approx 2019   CATARACT EXTRACTION W/PHACO Left 04/20/2023   Procedure: CATARACT EXTRACTION PHACO AND INTRAOCULAR LENS PLACEMENT (IOC) LEFT DIABETIC 23.62 01:55.0;  Surgeon: Estanislado Pandy, MD;  Location[facility];  Service: Ophthalmology;  Laterality: Left;   CORONARY/GRAFT ACUTE MI REVASCULARIZATION     approx 2012   HIP ARTHROPLASTY Right 11/19/2020   Procedure: ARTHROPLASTY BIPOLAR HIP (HEMIARTHROPLASTY);  Surgeon: Juanell Fairly, MD;  Location: [facility]  ORS;  Service: Orthopedics;  Laterality: Right;   KNEE SURGERY Left      reports that she quit smoking about 54 years ago. Her smoking use included cigarettes. She started smoking about 69 years ago. She has a 7.5 pack-year smoking history. She has never used smokeless tobacco. She reports that she does not currently use alcohol. She reports that she does not use drugs.  Allergies  Allergen Reactions   Sulfa Antibiotics     Blood pressure  dropped    Family History  Problem Relation Age of Onset   Heart disease Sister    Cancer Sister      Prior to Admission medications   Medication Sig Start Date End Date Taking? Authorizing Provider  atorvastatin (LIPITOR) 40 MG tablet Take 1 tablet by mouth daily. 01/02/23  Yes [provider]  benzonatate (TESSALON) 200 MG capsule Take 200 mg by mouth 3 (three) times daily as needed. 05/11/23  Yes [provider]  albuterol (VENTOLIN HFA) 108 (90 Base) MCG/ACT inhaler Inhale 1 puff into the lungs every 6 (six) hours as needed for wheezing or shortness of breath.    [provider]  atorvastatin (LIPITOR) 80 MG tablet Take 1 tablet (80 mg total) by mouth every evening. Patient not taking: Reported on 05/22/2023 12/02/20   Love, Evlyn Kanner, PA-C  benzonatate (TESSALON) 100 MG capsule Take 200 mg by mouth every 8 (eight) hours as needed for cough.    [provider]  bisoprolol (ZEBETA) 5 MG tablet Take 1 tablet (5 mg total) by mouth 2 (two) times daily. 12/02/20   Love, Evlyn Kanner, PA-C  diltiazem (CARDIZEM) 60 MG tablet Take 1 tablet (60 mg total) by mouth 2 (two) times daily. 12/02/20   Love, Evlyn Kanner, PA-C  edoxaban (SAVAYSA) 30 MG TABS tablet Take 1 tablet (30 mg total) by mouth daily. 12/02/20   Love, Evlyn Kanner, PA-C  Fluticasone-Umeclidin-Vilant (TRELEGY ELLIPTA) 100-62.5-25 MCG/ACT AEPB Inhale 1 puff into the lungs daily. 05/11/23 05/10/24  Jackolyn Confer, MD  metFORMIN (GLUCOPHAGE) 500 MG tablet Take 500 mg by mouth every evening.    [provider]    Physical Exam: Vitals:   05/22/23 0909 05/22/23 0912 05/22/23 1209  BP:  (!) 148/69 (!) 143/84  Pulse:  (!) 124 (!) 138  Resp:  (!) 26 (!) 22  Temp:  98 F (36.7 C) 98 F (36.7 C)  TempSrc:  Oral Oral  SpO2:  98% 98%  Weight: 52 kg    Height: 5\' 5"  (1.651 m)      Constitutional: NAD, calm, comfortable Vitals:   05/22/23 0909 05/22/23 0912 05/22/23 1209  BP:  (!) 148/69 (!) 143/84   Pulse:  (!) 124 (!) 138  Resp:  (!) 26 (!) 22  Temp:  98 F (36.7 C) 98 F (36.7 C)  TempSrc:  Oral Oral  SpO2:  98% 98%  Weight: 52 kg    Height: 5\' 5"  (1.651 m)     Eyes: PERRL, lids and conjunctivae normal ENMT: Mucous membranes are moist. Posterior pharynx clear of any exudate or lesions.Normal dentition.  Neck: normal, supple, no masses, no thyromegaly Respiratory: Diminished breathing sound bilaterally, diffused wheezing, scattered crackles on bilateral lower fields, increasing breathing effort, positive signs of accessory muscle use.  Cardiovascular: Regular rate and rhythm, no murmurs / rubs / gallops. No extremity edema. 2+ pedal pulses. No carotid bruits.  Abdomen: no tenderness, no masses palpated. No hepatosplenomegaly. Bowel sounds positive.  Musculoskeletal: no clubbing /  cyanosis. No joint deformity upper and lower extremities. Good ROM, no contractures. Normal muscle tone.  Skin: no rashes, lesions, ulcers. No induration Neurologic: CN 2-12 grossly intact. Sensation intact, DTR normal. Strength 5/5 in all 4.  Psychiatric: Normal judgment and insight. Alert and oriented x 3. Normal mood.    Labs on Admission: I have personally reviewed following labs and imaging studies  CBC: Recent Labs  Lab 05/22/23 1004  WBC 23.0*  NEUTROABS 20.4*  HGB 9.8*  HCT 34.0*  MCV 78.0*  PLT 518*   Basic Metabolic Panel: Recent Labs  Lab 05/22/23 0900  NA 118*  K 3.7  CL 85*  CO2 22  GLUCOSE 669*  BUN 45*  CREATININE 1.11*  CALCIUM 8.2*   GFR: Estimated Creatinine Clearance: 28.8 mL/min (A) (by C-G formula based on SCr of 1.11 mg/dL (H)). Liver Function Tests: No results for input(s): "AST", "ALT", "ALKPHOS", "BILITOT", "PROT", "ALBUMIN" in the last 168 hours. No results for input(s): "LIPASE", "AMYLASE" in the last 168 hours. No results for input(s): "AMMONIA" in the last 168 hours. Coagulation Profile: No results for input(s): "INR", "PROTIME" in the last 168  hours. Cardiac Enzymes: No results for input(s): "CKTOTAL", "CKMB", "CKMBINDEX", "TROPONINI" in the last 168 hours. BNP (last 3 results) No results for input(s): "PROBNP" in the last 8760 hours. HbA1C: No results for input(s): "HGBA1C" in the last 72 hours. CBG: No results for input(s): "GLUCAP" in the last 168 hours. Lipid Profile: No results for input(s): "CHOL", "HDL", "LDLCALC", "TRIG", "CHOLHDL", "LDLDIRECT" in the last 72 hours. Thyroid Function Tests: Recent Labs    05/22/23 1115  TSH 0.887   Anemia Panel: No results for input(s): "VITAMINB12", "FOLATE", "FERRITIN", "TIBC", "IRON", "RETICCTPCT" in the last 72 hours. Urine analysis:    Component Value Date/Time   COLORURINE AMBER (A) 11/21/2020 1011   APPEARANCEUR CLOUDY (A) 11/21/2020 1011   LABSPEC 1.031 (H) 11/21/2020 1011   PHURINE 5.0 11/21/2020 1011   GLUCOSEU NEGATIVE 11/21/2020 1011   HGBUR NEGATIVE 11/21/2020 1011   BILIRUBINUR NEGATIVE 11/21/2020 1011   KETONESUR 5 (A) 11/21/2020 1011   PROTEINUR 30 (A) 11/21/2020 1011   NITRITE NEGATIVE 11/21/2020 1011   LEUKOCYTESUR MODERATE (A) 11/21/2020 1011    Radiological Exams on Admission: CT Angio Chest PE W and/or Wo Contrast Result Date: 05/22/2023 CLINICAL DATA:  Shortness of breath EXAM: CT ANGIOGRAPHY CHEST WITH CONTRAST TECHNIQUE: Multidetector CT imaging of the chest was performed using the standard protocol during bolus administration of intravenous contrast. Multiplanar CT image reconstructions and MIPs were obtained to evaluate the vascular anatomy. RADIATION DOSE REDUCTION: This exam was performed according to the departmental dose-optimization program which includes automated exposure control, adjustment of the mA and/or kV according to patient size and/or use of iterative reconstruction technique. CONTRAST:  75mL OMNIPAQUE IOHEXOL 350 MG/ML SOLN COMPARISON:  Same-day x-ray FINDINGS: Cardiovascular: Satisfactory opacification of the pulmonary arteries to the  segmental level. No evidence of pulmonary embolism. Thoracic aorta is nonaneurysmal. Atherosclerotic calcifications of the aorta and coronary arteries. Normal heart size. No pericardial effusion. Mediastinum/Nodes: Mildly enlarged mediastinal lymph nodes including 11 mm lower right paratracheal node and 13 mm subcarinal node (series 4, images 64 and 72). Mildly enlarged right hilar lymph node measuring 11 mm (series 4, image 70). No axillary or left hilar lymphadenopathy. Tiny hiatal hernia. Esophagus otherwise unremarkable. Lungs/Pleura: Small layering bilateral pleural effusions. Prominent biapical pleuroparenchymal scarring. Ground-glass opacity bilaterally most pronounced within the perihilar regions and upper lobes. Diffuse bronchial wall thickening. Multiple scattered pulmonary  nodules including 8 mm nodule within the perihilar aspect of the left lower lobe (series 6, image 90). 5 mm right lower lobe nodule (series 6, image 99). Multiple calcified granulomas in the left upper lobe. No pneumothorax. Upper Abdomen: No acute abnormality. Musculoskeletal: Subacute or chronic appearing mild inferior endplate compression deformity of L1. Osseous structures are otherwise intact. No chest wall abnormality. Review of the MIP images confirms the above findings. IMPRESSION: 1. No evidence of pulmonary embolism. 2. Small layering bilateral pleural effusions. 3. Ground-glass opacity bilaterally most pronounced within the perihilar regions and upper lobes, which may represent pulmonary edema or atypical infection. 4. Multiple scattered pulmonary nodules measuring up to 8 mm in size. Non-contrast chest CT at 3-6 months is recommended. If the nodules are stable at time of repeat CT, then future CT at 18-24 months (from today's scan) is considered optional for low-risk patients, but is recommended for high-risk patients. This recommendation follows the consensus statement: Guidelines for Management of Incidental Pulmonary  Nodules Detected on CT Images: From the Fleischner Society 2017; Radiology 2017; 284:228-243. 5. Mildly enlarged mediastinal and right hilar lymph nodes, which may be reactive. 6. Subacute or chronic appearing mild inferior endplate compression deformity of L1. Correlate with point tenderness. 7. Aortic and coronary artery atherosclerosis (ICD10-I70.0). Electronically Signed   By: Duanne Guess D.O.   On: 05/22/2023 12:49   DG Chest Portable 1 View Result Date: 05/22/2023 CLINICAL DATA:  Shortness of breath EXAM: PORTABLE CHEST 1 VIEW COMPARISON:  11/21/2020 FINDINGS: Normal heart size. Aortic atherosclerosis. Mildly hyperinflated lungs. Persistently coarsened interstitial markings, most pronounced within the upper lobes. New small right pleural effusion. No pneumothorax. IMPRESSION: 1. New small right pleural effusion. 2. Persistently coarsened interstitial markings, most pronounced within the upper lobes. Electronically Signed   By: Duanne Guess D.O.   On: 05/22/2023 09:47    EKG: Independently reviewed.  A-fib with RVR, no acute ST changes.  Assessment/Plan Principal Problem:   CHF (congestive heart failure) (HCC) Active Problems:   Atrial fibrillation (HCC)   COPD (chronic obstructive pulmonary disease) (HCC)   Acute on chronic diastolic CHF (congestive heart failure) (HCC)  (please populate well all problems here in Problem List. (For example, if patient is on BP meds at home and you resume or decide to hold them, it is a problem that needs to be her. Same for CAD, COPD, HLD and so on)  Acute hypoxic respiratory failure -Multifactorial, appears to have concurrent acute CHF, probably diastolic, decompensation secondary to poorly controlled A-fib as well as COPD exacerbation -Ordered BiPAP to relieve breathing effort  Acute CHF decompensation, probably diastolic A-fib with RVR -Secondary to A-fib with RVR -Given there is significant symptoms signs of CHF decompensation, will change  rate control medication from Cardizem to digoxin loading 0.125 mg every 6 hours x 4 and as needed Lopressor. Check Mg and Phos level -Continue bisoprolol -Received Lasix x 1, recheck x-ray tomorrow to decide further diuresis plan. -Echocardiogram -Continue Savaysa  Acute COPD exacerbation -Solu-Medrol -BiPAP -Atrovent and as needed Xopenex -Incentive spirometry -Patient already received 6 days course of doxycycline, will hold off further antibiotic treatment -Check atypical study including Legionella and mycoplasma  Elevated troponins -Denied any chest pain, no acute ST changes on EKG -Clinically suspect elevation of troponin secondary to demanding ischemia from A-fib and CHF decompensation, trending of troponin is flat. -Echocardiogram  IIDM with hyperglycemia -Secondary to steroid -Start Lantus 15 units -SSI   DVT prophylaxis: Savaysa Code Status: Full code Family Communication:  Daughter at bedside Disposition Plan: Patient sick with uncontrolled A-fib CHF decompensated, requiring IV medications, expect more than 2 midnight hospital stay Consults called: None  Admission status: PCU admit   Emeline General MD [facility] Pager [phone]  05/22/2023, 2:08 PM

## 2023-05-22 NOTE — ED Notes (Signed)
Pt becomes more SOB with exertion   Pt was incon  Cleaned up and new brief placed

## 2023-05-22 NOTE — ED Provider Notes (Signed)
[facility] Provider Note    Event Date/Time   First MD Initiated Contact with Patient 05/22/23 [phone]     (approximate)   History   Shortness of Breath   HPI  Angelica Ferguson is a 89 y.o. female  who presents to the emergency department today via EMS because of concern for shortness of breath. The patient states that she has been getting progressively worse over the past couple of weeks. Has a history of COPD and has been using her inhalers at home. EMS gave duoneb treatments, solumedrol and started magnesium. The patient states she does feel better after the treatments by EMS.  In addition EMS noted patient to be in afib with RVR and started diltiazem.       Physical Exam   Triage Vital Signs: ED Triage Vitals  Encounter Vitals Group     BP 05/22/23 0912 (!) 148/69     Systolic BP Percentile --      Diastolic BP Percentile --      Pulse Rate 05/22/23 0912 (!) 124     Resp 05/22/23 0912 (!) 26     Temp 05/22/23 0912 98 F (36.7 C)     Temp Source 05/22/23 0912 Oral     SpO2 05/22/23 0912 98 %     Weight 05/22/23 0909 114 lb 10.2 oz (52 kg)     Height 05/22/23 0909 5\' 5"  (1.651 m)     Head Circumference --      Peak Flow --      Pain Score 05/22/23 0909 0     Pain Loc --      Pain Education --      Exclude from Growth Chart --     Most recent vital signs: Vitals:   05/22/23 0912  BP: (!) 148/69  Pulse: (!) 124  Resp: (!) 26  Temp: 98 F (36.7 C)  SpO2: 98%   General: Awake, alert, oriented. CV:  Good peripheral perfusion. Tachycardia, irregular rhythm.  Resp:  Normal effort. Crackles at bilateral bases. Abd:  No distention.  Other:  No lower extremity edema.   ED Results / Procedures / Treatments   Labs (all labs ordered are listed, but only abnormal results are displayed) Labs Reviewed  RESP PANEL BY RT-PCR (RSV, FLU A&B, COVID)  [id number]  CBC WITH DIFFERENTIAL/PLATELET  BASIC METABOLIC PANEL  BRAIN NATRIURETIC PEPTIDE   TROPONIN I (HIGH SENSITIVITY)     EKG  I, Phineas Semen, attending physician, personally viewed and interpreted this EKG  EKG Time: 0855 Rate: 119 Rhythm: atrial fibrillation with RVR Axis: normal Intervals: qtc 413 QRS: narrow ST changes: no st elevation Impression: abnormal ekg   RADIOLOGY I independently interpreted and visualized the CXR. My interpretation: small right pleural effusion Radiology interpretation: IMPRESSION:  1. New small right pleural effusion.  2. Persistently coarsened interstitial markings, most pronounced  within the upper lobes.     PROCEDURES:  Critical Care performed: Yes  CRITICAL CARE Performed by: Phineas Semen   Total critical care time: 35 minutes  Critical care time was exclusive of separately billable procedures and treating other patients.  Critical care was necessary to treat or prevent imminent or life-threatening deterioration.  Critical care was time spent personally by me on the following activities: development of treatment plan with patient and/or surrogate as well as nursing, discussions with consultants, evaluation of patient's response to treatment, examination of patient, obtaining history from patient or surrogate, ordering and performing treatments and interventions,  ordering and review of laboratory studies, ordering and review of radiographic studies, pulse oximetry and re-evaluation of patient's condition.   Procedures    MEDICATIONS ORDERED IN ED: Medications  ipratropium-albuterol (DUONEB) 0.5-2.5 (3) MG/3ML nebulizer solution 3 mL (3 mLs Nebulization Given 05/22/23 0927)  diltiazem (CARDIZEM) injection 10 mg (10 mg Intravenous Given 05/22/23 0927)     IMPRESSION / MDM / ASSESSMENT AND PLAN / ED COURSE  I reviewed the triage vital signs and the nursing notes.                              Differential diagnosis includes, but is not limited to, URI, COPD, ACS, anemia  Patient's presentation is most  consistent with acute presentation with potential threat to life or bodily function.   The patient is on the cardiac monitor to evaluate for evidence of arrhythmia and/or significant heart rate changes.  Patient presented to the emergency department today because of concern for progressive shortness of breath. On exam patient does have crackles to bilateral lower lungs. Will check blood work, CXR.  EKG does show atrial fibrillation with RVR.  Will give diltiazem.  Blood work was notable for elevated troponin however this was stable on recheck.  BNP is significantly elevated.  I do have concern for heart failure and I do think this could explain the troponin elevation.  Additionally patient's white blood cell count and glucose count are elevated.  Per history she has been on steroids for possible COPD but I would have concerns for possible infection as well.  CT was obtained of the chest which was concerning for possible infection versus edema.  Patient's heart rate continued to be elevated after diltiazem bolus.  Will write for infusion.  Patient was given IV antibiotics and IV Lasix.  Discussed with Dr. Chipper Herb with the hospitalist service who will evaluate for admission.        FINAL CLINICAL IMPRESSION(S) / ED DIAGNOSES   Final diagnoses:  Atrial fibrillation with RVR (HCC)  Acute pulmonary edema (HCC)  Elevated troponin      Note:  This document was prepared using Dragon voice recognition software and may include unintentional dictation errors.    Phineas Semen, MD 05/22/23 [phone]

## 2023-05-23 ENCOUNTER — Encounter: Payer: Self-pay | Admitting: Internal Medicine

## 2023-05-23 ENCOUNTER — Inpatient Hospital Stay: Payer: 59

## 2023-05-23 ENCOUNTER — Inpatient Hospital Stay (HOSPITAL_COMMUNITY)
Admit: 2023-05-23 | Discharge: 2023-05-23 | Disposition: A | Discharge status: Home / Self Care | Payer: 59 | Attending: Internal Medicine | Admitting: Internal Medicine

## 2023-05-23 DIAGNOSIS — I5033 Acute on chronic diastolic (congestive) heart failure: Secondary | ICD-10-CM

## 2023-05-23 DIAGNOSIS — I509 Heart failure, unspecified: Secondary | ICD-10-CM | POA: Diagnosis not present

## 2023-05-23 LAB — BASIC METABOLIC PANEL
Anion gap: 7 (ref 5–15)
BUN: 50 mg/dL — ABNORMAL HIGH (ref 8–23)
CO2: 27 mmol/L (ref 22–32)
Calcium: 8 mg/dL — ABNORMAL LOW (ref 8.9–10.3)
Chloride: 104 mmol/L (ref 98–111)
Creatinine, Ser: 1.05 mg/dL — ABNORMAL HIGH (ref 0.44–1.00)
GFR, Estimated: 51 mL/min — ABNORMAL LOW (ref >60)
Glucose, Bld: 149 mg/dL — ABNORMAL HIGH (ref 70–99)
Potassium: 5 mmol/L (ref 3.5–5.1)
Sodium: 138 mmol/L (ref 135–145)

## 2023-05-23 LAB — GLUCOSE, CAPILLARY: Glucose-Capillary: 160 mg/dL — ABNORMAL HIGH (ref 70–99)

## 2023-05-23 LAB — ECHOCARDIOGRAM COMPLETE
Area-P 1/2: 5.5 cm2
Height: 65 in
MV VTI: 1.33 cm2
S' Lateral: 3.5 cm
Weight: 1834.23 [oz_av]

## 2023-05-23 LAB — HEMOGLOBIN A1C
Hgb A1c MFr Bld: 6.5 % — ABNORMAL HIGH (ref 4.8–5.6)
Mean Plasma Glucose: 139.85 mg/dL

## 2023-05-23 LAB — URINALYSIS, ROUTINE W REFLEX MICROSCOPIC
Bacteria, UA: NONE SEEN
Bilirubin Urine: NEGATIVE
Glucose, UA: NEGATIVE mg/dL
Ketones, ur: NEGATIVE mg/dL
Leukocytes,Ua: NEGATIVE
Nitrite: NEGATIVE
Protein, ur: NEGATIVE mg/dL
Specific Gravity, Urine: 1.013 (ref 1.005–1.030)
pH: 5 (ref 5.0–8.0)

## 2023-05-23 LAB — CBG MONITORING, ED
Glucose-Capillary: 198 mg/dL — ABNORMAL HIGH (ref 70–99)
Glucose-Capillary: 132 mg/dL — ABNORMAL HIGH (ref 70–99)
Glucose-Capillary: 93 mg/dL (ref 70–99)

## 2023-05-23 MED ORDER — BISOPROLOL FUMARATE 5 MG PO TABS
10.0000 mg | ORAL_TABLET | Freq: Every day | ORAL | Status: DC
  Administered 2023-05-23 – 2023-05-25 (×3): 10 mg via ORAL
  Filled 2023-05-23 (×3): qty 2

## 2023-05-23 MED ORDER — FUROSEMIDE 10 MG/ML IJ SOLN
40.0000 mg | Freq: Once | INTRAMUSCULAR | Status: AC
  Administered 2023-05-23: 40 mg via INTRAVENOUS
  Filled 2023-05-23: qty 4

## 2023-05-23 MED ORDER — BENZONATATE 100 MG PO CAPS
200.0000 mg | ORAL_CAPSULE | Freq: Three times a day (TID) | ORAL | Status: DC
  Administered 2023-05-23 – 2023-05-26 (×10): 200 mg via ORAL
  Filled 2023-05-23 (×10): qty 2

## 2023-05-23 NOTE — Progress Notes (Signed)
PROGRESS NOTE    Angelica Ferguson   ZOX:[id number] DOB: [birth year]  DOA: 05/22/2023 Date of Service: 05/23/23 which is hospital day 1  PCP: Jackolyn Confer, MD    Hospital course / significant events:   HPI: Angelica Ferguson is a very pleasant 89 y.o. female with medical history significant of PAF, COPD, CAD status post stenting, DM2, brought in by family members for worsening cough, wheezing, shortness of breath, started 1 week prior, PCP tx for COPD exac, given doxy rx, inhalers, tessalon. Noted edema bilateral ankles x2 days. Daughter also reported that last 2 days, a wearable telemonitoring device on patient's arm show " A-fib."  As compared to baseline patient has rate controlled A-fib usually heart rate in the 70-80 range.  01/28: Chest x-ray showed pulmonary congestion. CTA showed negative for PE but bilateral multifocal infiltrates considered to be CHF versus multifocal pneumonia. BNP 1760. Afib RVR HR 130-150s, IV Cardizem and started on a drip, and was given IV Lasix 60 mg x 1 in the ED. Admitted to hospitalist, d/c cardizem, ordered IV digoxin, ordered prn metoprolol  01/29: repeat CXR increased pulmonary edema, Lasix IV repeated. Increased bisoprolol for rate control    Consultants:  none  Procedures/Surgeries: none      ASSESSMENT & PLAN:   Acute hypoxic respiratory failure Multifactorial, appears to have concurrent acute CHF, probably diastolic, decompensation secondary to poorly controlled A-fib as well as COPD exacerbation. Pneumonia less likely, Procalcitonin low Was on BiPAP --> weaned to [place] Supplemental O2 Treat underlying causes as below   Acute CHF decompensation, probably diastolic CAD Diuresis, titration based on symptoms/signs, I&O, renal function  Strict I&O (does not look like documentation is complete in the ED) Monitor BMP Echo done, pending read statin not on ASA d/t anticoag not on ACE/ARB consider starting this   A-fib with RVR -RVR  resolved/rate improved but remains tachycardic Given CHF decompensation, d/c Cardizem Continue bisoprolol --> increased dose  as needed Lopressor IV  Mg and Phos level - no deficiency Echo done, pending read  Anticoag Edoxoban  Acute COPD exacerbation on chronic COPD Have reasonably excluded pneumonia  Solu-Medrol Atrovent  as needed Xopenex Supplemental O2 Incentive spirometry Patient already received 6 days course of doxycycline, procal is low, will hold off further antibiotic treatment Check atypical study including Legionella and mycoplasma   Elevated troponins Denied any chest pain, no acute ST changes on EKG Clinically suspect elevation of troponin secondary to demand ischemia from A-fib and CHF decompensation, acute reps fail trending of troponin is flat Repeat troponin/EKG as needed  Echo    DM2 with hyperglycemia Secondary to steroid Start Lantus 15 units SSI    Borderline underweight based on BMI: Body mass index is 19.08 kg/m.  Underweight - under 18  overweight - 25 to 29 obese - 30 or more Class 1 obesity: BMI of 30.0 to 34 Class 2 obesity: BMI of 35.0 to 39 Class 3 obesity: BMI of 40.0 to 49 Super Morbid Obesity: BMI 50-59 Super-super Morbid Obesity: BMI 60+ Significantly low or high BMI is associated with higher medical risk.  Weight management advised as adjunct to other disease management and risk reduction treatments    DVT prophylaxis: Edoxoban IV fluids: no continuous IV fluids  Nutrition: cardiac/carb diet Central lines / invasive devices: none  Code Status: FULL CODE ACP documentation reviewed: none on file in [facility]  TOC needs: TBD Barriers to dispo / significant pending items: diuresing, HR still high, expect here another couple days  Subjective / Brief ROS:  Patient reports breathing much better today as compared to yesterday Denies CP/SOB at rest but really has not been out of bed Pain controlled.  Denies  new weakness.  Tolerating diet but low appetite.  Reports no concerns w/ urination/defecation.   Family Communication: Daughter is present at bedside on rounds, all questions answered    Objective Findings:  Vitals:   05/23/23 1200 05/23/23 1230 05/23/23 1235 05/23/23 1500  BP: 101/66 128/73  126/63  Pulse: (!) 112 (!) 106 (!) 106 (!) 106  Resp: 20 (!) 22  19  Temp: 98.1 F (36.7 C)     TempSrc: Oral     SpO2: 100% 100% 100% 100%  Weight:      Height:        Intake/Output Summary (Last 24 hours) at 05/23/2023 1652 Last data filed at 05/23/2023 0009 Gross per 24 hour  Intake --  Output 600 ml  Net -600 ml   Filed Weights   05/22/23 0909  Weight: 52 kg    Examination:  Physical Exam Constitutional:      General: She is not in acute distress.    Appearance: She is not ill-appearing.  Cardiovascular:     Rate and Rhythm: Tachycardia present. Rhythm irregular.  Pulmonary:     Breath sounds: Decreased breath sounds and wheezing (scattered) present.  Musculoskeletal:     Right lower leg: No edema.     Left lower leg: No edema.  Neurological:     General: No focal deficit present.     Mental Status: She is alert and oriented to person, place, and time.  Psychiatric:        Mood and Affect: Mood normal.        Behavior: Behavior normal.          Scheduled Medications:   atorvastatin  40 mg Oral Daily   benzonatate  200 mg Oral TID   bisoprolol  10 mg Oral Daily   edoxaban  30 mg Oral Daily   fluticasone furoate-vilanterol  1 puff Inhalation Daily   And   umeclidinium bromide  1 puff Inhalation Daily   insulin aspart  0-15 Units Subcutaneous TID WC   insulin aspart  0-5 Units Subcutaneous QHS   ipratropium  0.5 mg Nebulization Q6H   methylPREDNISolone (SOLU-MEDROL) injection  80 mg Intravenous Q24H   sodium chloride flush  3 mL Intravenous Q12H    Continuous Infusions:    PRN Medications:  acetaminophen, levalbuterol, metoprolol tartrate,  ondansetron (ZOFRAN) IV, sodium chloride flush  Antimicrobials from admission:  Anti-infectives (From admission, onward)    Start     Dose/Rate Route Frequency Ordered Stop   05/22/23 1300  azithromycin (ZITHROMAX) 500 mg in sodium chloride 0.9 % 250 mL IVPB        500 mg 250 mL/hr over 60 Minutes Intravenous  Once 05/22/23 1253 05/22/23 1620   05/22/23 1030  cefTRIAXone (ROCEPHIN) 1 g in sodium chloride 0.9 % 100 mL IVPB        1 g 200 mL/hr over 30 Minutes Intravenous  Once 05/22/23 1021 05/22/23 1208           Data Reviewed:  I have personally reviewed the following...  CBC: Recent Labs  Lab 05/22/23 1004  WBC 23.0*  NEUTROABS 20.4*  HGB 9.8*  HCT 34.0*  MCV 78.0*  PLT 518*   Basic Metabolic Panel: Recent Labs  Lab 05/22/23 0900 05/22/23 1330 05/22/23 2142 05/23/23 0615  NA 118*  --  135 138  K 3.7  --  4.7 5.0  CL 85*  --  98 104  CO2 22  --  21* 27  GLUCOSE 669*  --  194* 149*  BUN 45*  --  49* 50*  CREATININE 1.11*  --  1.17* 1.05*  CALCIUM 8.2*  --  8.7* 8.0*  MG  --  3.1*  --   --   PHOS  --  4.8*  --   --    GFR: Estimated Creatinine Clearance: 30.4 mL/min (A) (by C-G formula based on SCr of 1.05 mg/dL (H)). Liver Function Tests: No results for input(s): "AST", "ALT", "ALKPHOS", "BILITOT", "PROT", "ALBUMIN" in the last 168 hours. No results for input(s): "LIPASE", "AMYLASE" in the last 168 hours. No results for input(s): "AMMONIA" in the last 168 hours. Coagulation Profile: No results for input(s): "INR", "PROTIME" in the last 168 hours. Cardiac Enzymes: No results for input(s): "CKTOTAL", "CKMB", "CKMBINDEX", "TROPONINI" in the last 168 hours. BNP (last 3 results) No results for input(s): "PROBNP" in the last 8760 hours. HbA1C: Recent Labs    05/22/23 2142  HGBA1C 6.5*   CBG: Recent Labs  Lab 05/22/23 1715 05/23/23 1024 05/23/23 1226  GLUCAP 236* 198* 132*   Lipid Profile: No results for input(s): "CHOL", "HDL", "LDLCALC",  "TRIG", "CHOLHDL", "LDLDIRECT" in the last 72 hours. Thyroid Function Tests: Recent Labs    05/22/23 1115  TSH 0.887   Anemia Panel: Recent Labs    05/22/23 2142  FERRITIN 23  TIBC 480*  IRON 25*  RETICCTPCT 2.8   Most Recent Urinalysis On File:     Component Value Date/Time   COLORURINE STRAW (A) 05/22/2023 2142   APPEARANCEUR CLEAR (A) 05/22/2023 2142   LABSPEC 1.013 05/22/2023 2142   PHURINE 5.0 05/22/2023 2142   GLUCOSEU NEGATIVE 05/22/2023 2142   HGBUR MODERATE (A) 05/22/2023 2142   BILIRUBINUR NEGATIVE 05/22/2023 2142   KETONESUR NEGATIVE 05/22/2023 2142   PROTEINUR NEGATIVE 05/22/2023 2142   NITRITE NEGATIVE 05/22/2023 2142   LEUKOCYTESUR NEGATIVE 05/22/2023 2142   Sepsis Labs: @LABRCNTIP (procalcitonin:4,lacticidven:4) Microbiology: Recent Results (from the past 240 hours)  Resp panel by RT-PCR (RSV, Flu A&B, Covid) Anterior Nasal Swab     Status: None   Collection Time: 05/22/23 10:04 AM   Specimen: Anterior Nasal Swab  Result Value Ref Range Status   SARS Coronavirus 2 by RT PCR NEGATIVE NEGATIVE Final    Comment: (NOTE) SARS-CoV-2 target nucleic acids are NOT DETECTED.  The SARS-CoV-2 RNA is generally detectable in upper respiratory specimens during the acute phase of infection. The lowest concentration of SARS-CoV-2 viral copies this assay can detect is 138 copies/mL. A negative result does not preclude SARS-Cov-2 infection and should not be used as the sole basis for treatment or other patient management decisions. A negative result may occur with  improper specimen collection/handling, submission of specimen other than nasopharyngeal swab, presence of viral mutation(s) within the areas targeted by this assay, and inadequate number of viral copies(<138 copies/mL). A negative result must be combined with clinical observations, patient history, and epidemiological information. The expected result is Negative.  Fact Sheet for Patients:   BloggerCourse.com  Fact Sheet for Healthcare Providers:  SeriousBroker.it  This test is no t yet approved or cleared by the [place] FDA and  has been authorized for detection and/or diagnosis of SARS-CoV-2 by FDA under an Emergency Use Authorization (EUA). This EUA will remain  in effect (meaning this test can be used) for the duration of the  COVID-19 declaration under Section 564(b)(1) of the Act, 21 U.S.C.section 360bbb-3(b)(1), unless the authorization is terminated  or revoked sooner.       Influenza A by PCR NEGATIVE NEGATIVE Final   Influenza B by PCR NEGATIVE NEGATIVE Final    Comment: (NOTE) The Xpert Xpress SARS-CoV-2/FLU/RSV plus assay is intended as an aid in the diagnosis of influenza from Nasopharyngeal swab specimens and should not be used as a sole basis for treatment. Nasal washings and aspirates are unacceptable for Xpert Xpress SARS-CoV-2/FLU/RSV testing.  Fact Sheet for Patients: BloggerCourse.com  Fact Sheet for Healthcare Providers: SeriousBroker.it  This test is not yet approved or cleared by the [place] FDA and has been authorized for detection and/or diagnosis of SARS-CoV-2 by FDA under an Emergency Use Authorization (EUA). This EUA will remain in effect (meaning this test can be used) for the duration of the COVID-19 declaration under Section 564(b)(1) of the Act, 21 U.S.C. section 360bbb-3(b)(1), unless the authorization is terminated or revoked.     Resp Syncytial Virus by PCR NEGATIVE NEGATIVE Final    Comment: (NOTE) Fact Sheet for Patients: BloggerCourse.com  Fact Sheet for Healthcare Providers: SeriousBroker.it  This test is not yet approved or cleared by the [place] FDA and has been authorized for detection and/or diagnosis of SARS-CoV-2 by FDA under an Emergency Use  Authorization (EUA). This EUA will remain in effect (meaning this test can be used) for the duration of the COVID-19 declaration under Section 564(b)(1) of the Act, 21 U.S.C. section 360bbb-3(b)(1), unless the authorization is terminated or revoked.  Performed at [facility], [address] [place], [place] [address]   Blood culture (routine x 2)     Status: None (Preliminary result)   Collection Time: 05/22/23 11:15 AM   Specimen: BLOOD  Result Value Ref Range Status   Specimen Description BLOOD BLOOD RIGHT FOREARM  Final   Special Requests   Final    BOTTLES DRAWN AEROBIC AND ANAEROBIC Blood Culture results may not be optimal due to an inadequate volume of blood received in culture bottles   Culture   Final    NO GROWTH < 24 HOURS Performed at [facility], [address], [place], [place] [address]    Report Status PENDING  Incomplete  Blood culture (routine x 2)     Status: None (Preliminary result)   Collection Time: 05/22/23 11:26 AM   Specimen: BLOOD  Result Value Ref Range Status   Specimen Description BLOOD BLOOD LEFT WRIST  Final   Special Requests   Final    BOTTLES DRAWN AEROBIC AND ANAEROBIC Blood Culture results may not be optimal due to an inadequate volume of blood received in culture bottles   Culture   Final    NO GROWTH < 24 HOURS Performed at [facility], [address], [place], [place] [address]    Report Status PENDING  Incomplete      Radiology Studies last 3 days: DG Chest 1 View Result Date: 05/23/2023 CLINICAL DATA:  89 year old female with history of congestive heart failure. Shortness of breath. EXAM: CHEST  1 VIEW COMPARISON:  Chest x-ray 05/22/2023. FINDINGS: Lung volumes are normal. Widespread areas of interstitial prominence and peribronchial cuffing, similar to prior studies, likely reflective of chronic bronchitis. Opacity at the right base which may reflect atelectasis and/or consolidation, with superimposed  small right pleural effusion. No definite left pleural effusion. No pneumothorax. No evidence of pulmonary edema. Small calcified granulomas are noted in the left upper  lobe near the apex. Heart size is normal. The patient is rotated to the right on today's exam, resulting in distortion of the mediastinal contours and reduced diagnostic sensitivity and specificity for mediastinal pathology. IMPRESSION: 1. Atelectasis and/or consolidation in the right lung base with small right pleural effusion. 2. The appearance of the lungs suggests probable chronic bronchitis. 3. Aortic atherosclerosis. Electronically Signed   By: Trudie Reed M.D.   On: 05/23/2023 07:17   CT Angio Chest PE W and/or Wo Contrast Result Date: 05/22/2023 CLINICAL DATA:  Shortness of breath EXAM: CT ANGIOGRAPHY CHEST WITH CONTRAST TECHNIQUE: Multidetector CT imaging of the chest was performed using the standard protocol during bolus administration of intravenous contrast. Multiplanar CT image reconstructions and MIPs were obtained to evaluate the vascular anatomy. RADIATION DOSE REDUCTION: This exam was performed according to the departmental dose-optimization program which includes automated exposure control, adjustment of the mA and/or kV according to patient size and/or use of iterative reconstruction technique. CONTRAST:  75mL OMNIPAQUE IOHEXOL 350 MG/ML SOLN COMPARISON:  Same-day x-ray FINDINGS: Cardiovascular: Satisfactory opacification of the pulmonary arteries to the segmental level. No evidence of pulmonary embolism. Thoracic aorta is nonaneurysmal. Atherosclerotic calcifications of the aorta and coronary arteries. Normal heart size. No pericardial effusion. Mediastinum/Nodes: Mildly enlarged mediastinal lymph nodes including 11 mm lower right paratracheal node and 13 mm subcarinal node (series 4, images 64 and 72). Mildly enlarged right hilar lymph node measuring 11 mm (series 4, image 70). No axillary or left hilar lymphadenopathy.  Tiny hiatal hernia. Esophagus otherwise unremarkable. Lungs/Pleura: Small layering bilateral pleural effusions. Prominent biapical pleuroparenchymal scarring. Ground-glass opacity bilaterally most pronounced within the perihilar regions and upper lobes. Diffuse bronchial wall thickening. Multiple scattered pulmonary nodules including 8 mm nodule within the perihilar aspect of the left lower lobe (series 6, image 90). 5 mm right lower lobe nodule (series 6, image 99). Multiple calcified granulomas in the left upper lobe. No pneumothorax. Upper Abdomen: No acute abnormality. Musculoskeletal: Subacute or chronic appearing mild inferior endplate compression deformity of L1. Osseous structures are otherwise intact. No chest wall abnormality. Review of the MIP images confirms the above findings. IMPRESSION: 1. No evidence of pulmonary embolism. 2. Small layering bilateral pleural effusions. 3. Ground-glass opacity bilaterally most pronounced within the perihilar regions and upper lobes, which may represent pulmonary edema or atypical infection. 4. Multiple scattered pulmonary nodules measuring up to 8 mm in size. Non-contrast chest CT at 3-6 months is recommended. If the nodules are stable at time of repeat CT, then future CT at 18-24 months (from today's scan) is considered optional for low-risk patients, but is recommended for high-risk patients. This recommendation follows the consensus statement: Guidelines for Management of Incidental Pulmonary Nodules Detected on CT Images: From the Fleischner Society 2017; Radiology 2017; 284:228-243. 5. Mildly enlarged mediastinal and right hilar lymph nodes, which may be reactive. 6. Subacute or chronic appearing mild inferior endplate compression deformity of L1. Correlate with point tenderness. 7. Aortic and coronary artery atherosclerosis (ICD10-I70.0). Electronically Signed   By: Duanne Guess D.O.   On: 05/22/2023 12:49   DG Chest Portable 1 View Result Date:  05/22/2023 CLINICAL DATA:  Shortness of breath EXAM: PORTABLE CHEST 1 VIEW COMPARISON:  11/21/2020 FINDINGS: Normal heart size. Aortic atherosclerosis. Mildly hyperinflated lungs. Persistently coarsened interstitial markings, most pronounced within the upper lobes. New small right pleural effusion. No pneumothorax. IMPRESSION: 1. New small right pleural effusion. 2. Persistently coarsened interstitial markings, most pronounced within the upper lobes. Electronically Signed   By:  Nicholas  Plundo D.O.   On: 05/22/2023 09:47       Sunnie Nielsen, DO [facility] 05/23/2023, 4:52 PM    Dictation software may have been used to generate the above note. Typos may occur and escape review in typed/dictated notes. Please contact Dr Lyn Hollingshead directly for clarity if needed.  Staff may message me via secure chat in Epic  but this may not receive an immediate response,  please page me for urgent matters!  If 7PM-7AM, please contact night coverage [email or web address]

## 2023-05-23 NOTE — Progress Notes (Signed)
PT Cancellation Note  Patient Details Name: Angelica Ferguson MRN: [id number] DOB: 01-18-1933   Cancelled Treatment:    Reason Eval/Treat Not Completed: Medical issues which prohibited therapy (Order received, chart reviewed- vitals outside of safe range for PT evalaution at this time. HR 119. Will continue to follow, inititate PT services once appropriate.)   Nealy Hickmon C 05/23/2023, 8:58 AM

## 2023-05-23 NOTE — ED Notes (Signed)
No change in pts breathing. Dyspnea with minor exertion - rolling x2 to change brief.

## 2023-05-23 NOTE — Hospital Course (Addendum)
Hospital course / significant events:   HPI: Angelica Ferguson is a very pleasant 89 y.o. female with medical history significant of PAF, COPD, CAD status post stenting, DM2, brought in by family members for worsening cough, wheezing, shortness of breath, started 1 week prior, PCP tx for COPD exac, given doxy rx, inhalers, tessalon. Noted edema bilateral ankles x2 days. Daughter also reported that last 2 days, a wearable telemonitoring device on patient's arm show " A-fib."  As compared to baseline patient has rate controlled A-fib usually heart rate in the 70-80 range.  01/28: Chest x-ray showed pulmonary congestion. CTA showed negative for PE but bilateral multifocal infiltrates considered to be CHF versus multifocal pneumonia. BNP 1760. Afib RVR HR 130-150s, IV Cardizem and started on a drip, and was given IV Lasix 60 mg x 1 in the ED. Admitted to hospitalist, d/c cardizem, ordered IV digoxin, ordered prn metoprolol  1/29.  Patient feeling little bit better.  Echocardiogram showed an EF of 25 to 30%.  Patient on Lasix diuresis. 1/30.  Continue IV Lasix diuresis.  Increase bisoprolol to 10 mg twice daily 1/31.  Patient feeling better and wanting to go home.

## 2023-05-23 NOTE — Progress Notes (Signed)
*  PRELIMINARY RESULTS* Echocardiogram 2D Echocardiogram has been performed.  Carolyne Fiscal 05/23/2023, 2:59 PM

## 2023-05-24 ENCOUNTER — Telehealth (HOSPITAL_COMMUNITY): Payer: Self-pay | Admitting: Pharmacy Technician

## 2023-05-24 ENCOUNTER — Other Ambulatory Visit (HOSPITAL_COMMUNITY): Payer: Self-pay

## 2023-05-24 DIAGNOSIS — J441 Chronic obstructive pulmonary disease with (acute) exacerbation: Secondary | ICD-10-CM | POA: Diagnosis not present

## 2023-05-24 DIAGNOSIS — I420 Dilated cardiomyopathy: Secondary | ICD-10-CM

## 2023-05-24 DIAGNOSIS — J9601 Acute respiratory failure with hypoxia: Secondary | ICD-10-CM | POA: Insufficient documentation

## 2023-05-24 DIAGNOSIS — I509 Heart failure, unspecified: Secondary | ICD-10-CM

## 2023-05-24 DIAGNOSIS — J810 Acute pulmonary edema: Secondary | ICD-10-CM | POA: Diagnosis not present

## 2023-05-24 DIAGNOSIS — J449 Chronic obstructive pulmonary disease, unspecified: Secondary | ICD-10-CM

## 2023-05-24 DIAGNOSIS — J9621 Acute and chronic respiratory failure with hypoxia: Secondary | ICD-10-CM

## 2023-05-24 DIAGNOSIS — E119 Type 2 diabetes mellitus without complications: Secondary | ICD-10-CM

## 2023-05-24 DIAGNOSIS — I5A Non-ischemic myocardial injury (non-traumatic): Secondary | ICD-10-CM

## 2023-05-24 DIAGNOSIS — R7989 Other specified abnormal findings of blood chemistry: Secondary | ICD-10-CM

## 2023-05-24 DIAGNOSIS — D508 Other iron deficiency anemias: Secondary | ICD-10-CM

## 2023-05-24 DIAGNOSIS — I5023 Acute on chronic systolic (congestive) heart failure: Secondary | ICD-10-CM | POA: Diagnosis not present

## 2023-05-24 DIAGNOSIS — I4891 Unspecified atrial fibrillation: Principal | ICD-10-CM

## 2023-05-24 DIAGNOSIS — I10 Essential (primary) hypertension: Secondary | ICD-10-CM

## 2023-05-24 DIAGNOSIS — E1165 Type 2 diabetes mellitus with hyperglycemia: Secondary | ICD-10-CM

## 2023-05-24 DIAGNOSIS — D509 Iron deficiency anemia, unspecified: Secondary | ICD-10-CM

## 2023-05-24 DIAGNOSIS — Z794 Long term (current) use of insulin: Secondary | ICD-10-CM

## 2023-05-24 HISTORY — DX: Acute and chronic respiratory failure with hypoxia: J96.21

## 2023-05-24 HISTORY — DX: Non-ischemic myocardial injury (non-traumatic): I5A

## 2023-05-24 HISTORY — DX: Acute pulmonary edema: J81.0

## 2023-05-24 LAB — BASIC METABOLIC PANEL
Anion gap: 11 (ref 5–15)
BUN: 59 mg/dL — ABNORMAL HIGH (ref 8–23)
CO2: 31 mmol/L (ref 22–32)
Calcium: 8.8 mg/dL — ABNORMAL LOW (ref 8.9–10.3)
Chloride: 97 mmol/L — ABNORMAL LOW (ref 98–111)
Creatinine, Ser: 0.96 mg/dL (ref 0.44–1.00)
GFR, Estimated: 57 mL/min — ABNORMAL LOW (ref >60)
Glucose, Bld: 163 mg/dL — ABNORMAL HIGH (ref 70–99)
Potassium: 4.8 mmol/L (ref 3.5–5.1)
Sodium: 139 mmol/L (ref 135–145)

## 2023-05-24 LAB — CBC
HCT: 31.8 % — ABNORMAL LOW (ref 36.0–46.0)
Hemoglobin: 9.6 g/dL — ABNORMAL LOW (ref 12.0–15.0)
MCH: 22.6 pg — ABNORMAL LOW (ref 26.0–34.0)
MCHC: 30.2 g/dL (ref 30.0–36.0)
MCV: 74.8 fL — ABNORMAL LOW (ref 80.0–100.0)
Platelets: 399 10*3/uL (ref 150–400)
RBC: 4.25 MIL/uL (ref 3.87–5.11)
RDW: 18.2 % — ABNORMAL HIGH (ref 11.5–15.5)
WBC: 20 10*3/uL — ABNORMAL HIGH (ref 4.0–10.5)
nRBC: 0.1 % (ref 0.0–0.2)

## 2023-05-24 LAB — GLUCOSE, CAPILLARY
Glucose-Capillary: 162 mg/dL — ABNORMAL HIGH (ref 70–99)
Glucose-Capillary: 151 mg/dL — ABNORMAL HIGH (ref 70–99)
Glucose-Capillary: 212 mg/dL — ABNORMAL HIGH (ref 70–99)
Glucose-Capillary: 120 mg/dL — ABNORMAL HIGH (ref 70–99)

## 2023-05-24 LAB — LEGIONELLA PNEUMOPHILA SEROGP 1 UR AG: L. pneumophila Serogp 1 Ur Ag: NEGATIVE

## 2023-05-24 MED ORDER — BISOPROLOL FUMARATE 5 MG PO TABS
5.0000 mg | ORAL_TABLET | Freq: Once | ORAL | Status: AC
  Administered 2023-05-24: 5 mg via ORAL
  Filled 2023-05-24: qty 1

## 2023-05-24 MED ORDER — LOSARTAN POTASSIUM 25 MG PO TABS
25.0000 mg | ORAL_TABLET | Freq: Every day | ORAL | Status: DC
  Administered 2023-05-24 – 2023-05-25 (×2): 25 mg via ORAL
  Filled 2023-05-24 (×2): qty 1

## 2023-05-24 MED ORDER — METHYLPREDNISOLONE SODIUM SUCC 40 MG IJ SOLR
40.0000 mg | INTRAMUSCULAR | Status: DC
  Administered 2023-05-24 – 2023-05-25 (×2): 40 mg via INTRAVENOUS
  Filled 2023-05-24 (×2): qty 1

## 2023-05-24 MED ORDER — DIGOXIN 125 MCG PO TABS
0.1250 mg | ORAL_TABLET | Freq: Every day | ORAL | Status: DC
  Administered 2023-05-24 – 2023-05-26 (×3): 0.125 mg via ORAL
  Filled 2023-05-24 (×3): qty 1

## 2023-05-24 MED ORDER — FUROSEMIDE 10 MG/ML IJ SOLN
40.0000 mg | Freq: Once | INTRAMUSCULAR | Status: AC
  Administered 2023-05-24: 40 mg via INTRAVENOUS
  Filled 2023-05-24: qty 4

## 2023-05-24 MED ORDER — GUAIFENESIN-DM 100-10 MG/5ML PO SYRP
15.0000 mL | ORAL_SOLUTION | ORAL | Status: DC | PRN
  Administered 2023-05-24: 15 mL via ORAL
  Filled 2023-05-24: qty 20

## 2023-05-24 NOTE — Assessment & Plan Note (Addendum)
Can go back on Trelegy Ellipta as outpatient.  Patient was on Trelegy inhaler substitutes while here and IV Solu-Medrol.  Switch over to prednisone for 1 more day

## 2023-05-24 NOTE — Plan of Care (Signed)

## 2023-05-24 NOTE — Evaluation (Signed)
Occupational Therapy Evaluation Patient Details Name: Angelica Ferguson MRN: [id number] DOB: 03-Jun-1932 Today's Date: 05/24/2023   History of Present Illness presented to ER secondary to progressive cough, wheezing, SOB; admitted for management of acute hypoxic respiratory failure due to CHF exacerbation   Clinical Impression   Chart reviewed, pt greeted in room with daughter present. Pt is alert and oriented x4, HOH. PTA pt lives with daughter who assists with ADL as needed/IADLs. Pt performs dressing, grooming, feeding, toileting with MOD I, assist for bathing (pt likes to take bath and has AD to assist with that). Pt amb with no AD household/limited community distances. Pt presents with deficits in activity tolerance, balance, endurance affecting safe and optimal ADL completion. Pt and daughter provided hand out re: energy conservation techniques for improved ADL performance. All questions answered. OT will follow acutely to facilitate optimal ADL performance, return to PLOF.       If plan is discharge home, recommend the following: A little help with bathing/dressing/bathroom;Assistance with cooking/housework;Assist for transportation;Help with stairs or ramp for entrance    Functional Status Assessment  Patient has had a recent decline in their functional status and demonstrates the ability to make significant improvements in function in a reasonable and predictable amount of time.  Equipment Recommendations  None recommended by OT;Other (comment) (pt has recommended equipment)    Recommendations for Other Services       Precautions / Restrictions Precautions Precautions: Fall Restrictions Weight Bearing Restrictions Per Provider Order: No      Mobility Bed Mobility               General bed mobility comments: NT in recliner pre/post session    Transfers Overall transfer level: Needs assistance Equipment used: Rolling walker (2 wheels) Transfers: Sit to/from  Stand Sit to Stand: Contact guard assist                  Balance Overall balance assessment: Needs assistance Sitting-balance support: No upper extremity supported, Feet supported Sitting balance-Leahy Scale: Good     Standing balance support: Bilateral upper extremity supported Standing balance-Leahy Scale: Fair                             ADL either performed or assessed with clinical judgement   ADL Overall ADL's : Needs assistance/impaired Eating/Feeding: Sitting;Set up   Grooming: Wash/dry hands;Standing;Contact guard assist Grooming Details (indicate cue type and reason): with RW at sink level         Upper Body Dressing : Supervision/safety;Sitting   Lower Body Dressing: Minimal assistance;Sitting/lateral leans Lower Body Dressing Details (indicate cue type and reason): simulated, socks, discussed ecs Toilet Transfer: Supervision/safety;Rolling walker (2 wheels);BSC/3in1 Toilet Transfer Details (indicate cue type and reason): intermittent vcs Toileting- Clothing Manipulation and Hygiene: Supervision/safety;Sitting/lateral lean Toileting - Clothing Manipulation Details (indicate cue type and reason): continent urine on bsc over toilet     Functional mobility during ADLs: Contact guard assist;Rolling walker (2 wheels) (approx 15' in room with two attempts)       Vision Patient Visual Report: No change from baseline       Perception         Praxis         Pertinent Vitals/Pain Pain Assessment Pain Assessment: No/denies pain     Extremity/Trunk Assessment Upper Extremity Assessment Upper Extremity Assessment: Overall WFL for tasks assessed   Lower Extremity Assessment Lower Extremity Assessment: Overall WFL for tasks assessed  Communication Communication Communication: Hearing impairment Cueing Techniques: Verbal cues   Cognition Arousal: Alert Behavior During Therapy: WFL for tasks assessed/performed Overall Cognitive  Status: Within Functional Limits for tasks assessed                                       General Comments  vss throughout with spo2 >90% on 2L via [place] throughout    Exercises Other Exercises Other Exercises: edu pt and daugther re: role of OT, role of rehab, energy conservation technqiues (via hand out and demo), discharge recommendations   Shoulder Instructions      Home Living Family/patient expects to be discharged to:: Private residence Living Arrangements: Children Available Help at Discharge: Family;Available 24 hours/day Type of Home: House Home Access: Stairs to enter [employer] of Steps: 3 Entrance Stairs-Rails: Right;Left;Can reach both Home Layout: Two level;Bed/bath upstairs (has stair lift)         Bathroom Toilet: Handicapped height     Home Equipment: Rollator (4 wheels);Wheelchair - manual;BSC/3in1;Adaptive equipment Adaptive Equipment: Reacher Additional Comments: Shower chair raises/lowers patient into tub to allow for baths      Prior Functioning/Environment Prior Level of Function : Independent/Modified Independent             Mobility Comments: amb household distances/ limited community without AD ADLs Comments: supervision for bathing as needed, MOD I for dressing, grooming feeding, toileting; assist for IADLs from daugther        OT Problem List: Decreased activity tolerance;Impaired balance (sitting and/or standing);Cardiopulmonary status limiting activity;Decreased knowledge of use of DME or AE      OT Treatment/Interventions: Self-care/ADL training;DME and/or AE instruction;Therapeutic activities;Balance training;Therapeutic exercise;Patient/family education    OT Goals(Current goals can be found in the care plan section) Acute Rehab OT Goals Patient Stated Goal: go home OT Goal Formulation: With patient/family Time For Goal Achievement: 06/07/23 Potential to Achieve Goals: Good ADL Goals Pt Will Perform  Grooming: with modified independence;sitting Pt Will Perform Lower Body Dressing: with modified independence;sitting/lateral leans;sit to/from stand Pt Will Transfer to Toilet: with modified independence;ambulating Pt Will Perform Toileting - Clothing Manipulation and hygiene: with modified independence;sit to/from stand;sitting/lateral leans  OT Frequency: Min 1X/week    Co-evaluation              AM-PAC OT "6 Clicks" Daily Activity     Outcome Measure Help from another person eating meals?: None Help from another person taking care of personal grooming?: None Help from another person toileting, which includes using toliet, bedpan, or urinal?: None Help from another person bathing (including washing, rinsing, drying)?: A Little Help from another person to put on and taking off regular upper body clothing?: None Help from another person to put on and taking off regular lower body clothing?: A Little 6 Click Score: 22   End of Session Equipment Utilized During Treatment: Rolling walker (2 wheels);Oxygen Nurse Communication: Mobility status  Activity Tolerance: Patient tolerated treatment well Patient left: in chair;with call bell/phone within reach;with chair alarm set;with family/visitor present  OT Visit Diagnosis: Other abnormalities of gait and mobility (R26.89)                Time: 3086-5784 OT Time Calculation (min): 28 min Charges:  OT General Charges $OT Visit: 1 Visit OT Evaluation $OT Eval Low Complexity: 1 Low  Oleta Mouse, OTD OTR/L  05/24/23, 1:46 PM

## 2023-05-24 NOTE — Assessment & Plan Note (Addendum)
Pulse ox of 77% yesterday on room air.  Patient did better with ambulating on room air today.  Did have an episode that dipped briefly down in the 70s.  Home oxygen prescribed 2 L.

## 2023-05-24 NOTE — Evaluation (Signed)
Physical Therapy Evaluation Patient Details Name: Angelica Ferguson MRN: [id number] DOB: 1933-04-16 Today's Date: 05/24/2023  History of Present Illness  presented to ER secondary to progressive cough, wheezing, SOB; admitted for management of acute hypoxic respiratory failure due to CHF exacerbation  Clinical Impression  Patient seated in recliner upon arrival to session; supportive daughter seated at bedside.  Patient alert and oriented, follows commands; very pleasant, cooperative and motivated to participate/progress.  Denies pain.  Bilat UE/LE strength and ROM grossly symmetrical and WFL; no focal weakness appreciated.  Able to complete sit/stand, standin balance, basic transfers and gait (120') with RW, cga/min assist.  Demonstrates reciprocal stepping pattern with fair step height/length; slow, but steady, cadence. Good walker position/management. HR stable in low-100s with gait efforts, sats >93% on 2L with activity. BORG 8-9/10 after above distance (requiring seated rest for recovery)  Would benefit from skilled PT to address above deficits and promote optimal return to PLOF.; recommend post-acute PT follow up as indicated by interdisciplinary care team.            If plan is discharge home, recommend the following: A little help with bathing/dressing/bathroom;A little help with walking and/or transfers   Can travel by private vehicle        Equipment Recommendations Rolling walker (2 wheels)  Recommendations for Other Services       Functional Status Assessment Patient has had a recent decline in their functional status and demonstrates the ability to make significant improvements in function in a reasonable and predictable amount of time.     Precautions / Restrictions Precautions Precautions: Fall Restrictions Weight Bearing Restrictions Per Provider Order: No      Mobility  Bed Mobility               General bed mobility comments: seated in recliner  beginning/end of treatment session    Transfers Overall transfer level: Needs assistance Equipment used: Rolling walker (2 wheels) Transfers: Sit to/from Stand Sit to Stand: Contact guard assist, Min assist           General transfer comment: cuing for hand placement    Ambulation/Gait Ambulation/Gait assistance: Contact guard assist, Min assist Gait Distance (Feet): 120 Feet Assistive device: Rolling walker (2 wheels)         General Gait Details: reciprocal stepping pattern with fair step height/length; slow, but steady, cadence.  Good walker position/management.  HR stable in low-100s with gait efforts, sats >93% on 2L with activity. BORG 8-9/10 after above distance (requiring seated rest for recovery)  Stairs            Wheelchair Mobility     Tilt Bed    Modified Rankin (Stroke Patients Only)       Balance Overall balance assessment: Needs assistance Sitting-balance support: No upper extremity supported, Feet supported Sitting balance-Leahy Scale: Good     Standing balance support: Bilateral upper extremity supported Standing balance-Leahy Scale: Fair                               Pertinent Vitals/Pain Pain Assessment Pain Assessment: No/denies pain    Home Living Family/patient expects to be discharged to:: Private residence Living Arrangements: Children Available Help at Discharge: Family;Available 24 hours/day Type of Home: House Home Access: Stairs to enter Entrance Stairs-Rails: Right;Left;Can reach both Entrance Stairs-Number of Steps: 3   Home Layout: Two level;Bed/bath upstairs (has access to stair lift to get to/from upper level of home)  Home Equipment: Rollator (4 wheels);Wheelchair - manual Additional Comments: Shower chair raises/lowers patient into tub to allow for baths    Prior Function Prior Level of Function : Independent/Modified Independent             Mobility Comments: Mod indep for household  distances, limited community mobilization (to/from restaurants) without assist device; intermittently holds daughters arm for stabilization as needed.  No home O2.  Denies recent fall history.       Extremity/Trunk Assessment   Upper Extremity Assessment Upper Extremity Assessment: Overall WFL for tasks assessed    Lower Extremity Assessment Lower Extremity Assessment: Overall WFL for tasks assessed (grossly at least 4/5 throughout; no focal weakness apprecaited)       Communication   Communication Communication: Hearing impairment  Cognition Arousal: Alert Behavior During Therapy: WFL for tasks assessed/performed Overall Cognitive Status: Within Functional Limits for tasks assessed                                          General Comments      Exercises     Assessment/Plan    PT Assessment Patient needs continued PT services  PT Problem List Decreased range of motion;Decreased activity tolerance;Decreased balance;Decreased mobility;Decreased cognition;Decreased knowledge of use of DME;Decreased safety awareness;Decreased knowledge of precautions;Cardiopulmonary status limiting activity       PT Treatment Interventions DME instruction;Gait training;Stair training;Functional mobility training;Therapeutic activities;Therapeutic exercise;Balance training;Cognitive remediation;Patient/family education    PT Goals (Current goals can be found in the Care Plan section)  Acute Rehab PT Goals Patient Stated Goal: to return home PT Goal Formulation: With patient/family Time For Goal Achievement: 06/07/23 Potential to Achieve Goals: Good    Frequency Min 1X/week     Co-evaluation               AM-PAC PT "6 Clicks" Mobility  Outcome Measure Help needed turning from your back to your side while in a flat bed without using bedrails?: None Help needed moving from lying on your back to sitting on the side of a flat bed without using bedrails?: None Help  needed moving to and from a bed to a chair (including a wheelchair)?: A Little Help needed standing up from a chair using your arms (e.g., wheelchair or bedside chair)?: A Little Help needed to walk in hospital room?: A Little Help needed climbing 3-5 steps with a railing? : A Little 6 Click Score: 20    End of Session Equipment Utilized During Treatment: Oxygen Activity Tolerance: Patient tolerated treatment well Patient left: in chair;with call bell/phone within reach;with chair alarm set;with family/visitor present Nurse Communication: Mobility status PT Visit Diagnosis: Muscle weakness (generalized) (M62.81);Difficulty in walking, not elsewhere classified (R26.2)    Time: 9528-4132 PT Time Calculation (min) (ACUTE ONLY): 27 min   Charges:   PT Evaluation $PT Eval Moderate Complexity: 1 Mod   PT General Charges $$ ACUTE PT VISIT: 1 Visit         Brandalyn Harting H. Manson Ferguson, PT, DPT, NCS 05/24/23, 10:37 AM [phone]

## 2023-05-24 NOTE — Hospital Course (Signed)
89 y/o female, with history of COPD, CAD, and Atrial fibrillation, admit multifoc pna Donna in 04/2020 2. Atrial fibrillation with RVR (I48.91) Possibly due to underlying pneumonia. troponin negative x3. Echocardiogram shows a normal EF with grade 3 diastolic dysfunction. I have increased the patient oral diltiazem to 60 mg 4 times daily from 30, Cardiology on board, they have increased her dose of bisoprolol, to take 7.5 mg BID. Continue home anticoagulation. Maintain electrolytes optimized. Maintain on telemetry. 3. Community acquired pneumonia (J18.9) Seen on CTA of the chest. Patient is to remain on IV antibiotics on day 5/5, Continue pulmonary toilet with incentive spirometry and as needed and scheduled DuoNebs Blood cultures negative to date. 4. Chest pain (R07.9) Likely 2/2 afib, pneumonia. Troponin negative x3. and remain on tele. D dimer elevated, with CTA neg for PE. Chest pain has resolved. 5. Coronary artery disease (I25.10) Hx of CAD s/p 1 stent in 2012; cont home rx. Current EKG, trop negative for ACS picture. Cardiology recommended The addition of aspirin however deferred this to primary cardiologist in [place]

## 2023-05-24 NOTE — Progress Notes (Signed)
Mobility Specialist - Progress Note    05/24/23 1500  Mobility  Activity Refused mobility   Pt sleeping on arrival, awakened to voice and politely declined mobility at this time d/t being fatigued. Pt has worked with both PT and OT earlier this date. Will attempt another date/time as appropriate.   Filiberto Pinks Mobility Specialist 05/24/23, 3:18 PM

## 2023-05-24 NOTE — Assessment & Plan Note (Addendum)
Patient on digoxin and bisoprolol twice daily.  Anticoagulated with edoxaban.

## 2023-05-24 NOTE — Assessment & Plan Note (Addendum)
Hemoglobin A1c 6.5.  Will hold Glucophage for now since sugars not that elevated even with steroids.

## 2023-05-24 NOTE — Telephone Encounter (Signed)
Patient Product/process development scientist completed.    The patient is insured through [employer]. Patient has [employer], may use a copay card, and/or apply for patient assistance if available.    Ran test claim for Eliquis 5 mg and the current 30 day co-pay is $40.00.  Ran test claim for Xarelto 20 mg and the current 30 day co-pay is $40.00.  Ran test claim for Jardiance 10 mg and the current 30 day co-pay is $40.00.  Ran test claim for Savaysa 30 mg and the current 30 day co-pay is $418.57  Ran test claim for Farxiga 10 mg and Product Not Covered  Ran test claim for Kerendia 10 mg and Requires Prior Authorization   This test claim was processed through [employer]- copay amounts may vary at other pharmacies due to [employer], or as the patient moves through the different stages of their insurance plan.     Roland Earl, CPHT Pharmacy Technician III Certified Patient Advocate [facility] Patient Advocate Team Direct Number: [phone]  Fax: [phone]

## 2023-05-24 NOTE — Assessment & Plan Note (Addendum)
Continue increased dose of bisoprolol 10 mg twice daily bisoprolol.  Continue low-dose Cozaar.  EF of 25 to 30%.  Patient given IV Lasix today and be switched over to oral Lasix for tomorrow.  As per cardiology he can consider Aldactone and/or Farxiga as outpatient.  Since she is frail and do not want to add too many medications at once.

## 2023-05-24 NOTE — Assessment & Plan Note (Addendum)
Continue bisoprolol and Cozaar.  Plan to add Aldactone as outpatient if blood pressure allows

## 2023-05-24 NOTE — Assessment & Plan Note (Addendum)
Hemoglobin 10.6 with a ferritin of 23.

## 2023-05-24 NOTE — Consult Note (Signed)
Cardiology Consultation   Patient ID: Angelica Ferguson MRN: [id number]; DOB: 1932/10/29  Admit date: 05/22/2023 Date of Consult: 05/24/2023  PCP:  Angelica Confer, MD   [facility] Providers Cardiologist:  None        Patient Profile:   Angelica Ferguson is a 89 y.o. female with a hx of paroxysmal atrial fibrillation, COPD, CAD s/p stenting 2012, and T2DM who is being seen 05/24/2023 for the evaluation of CHF and atrial fibrillation at the request of Dr. Renae Ferguson.  History of Present Illness:   Angelica Ferguson is from [place] where she previously received medical care. Chart review is limited for this reason. Upon review, she was hospitalized at [facility] 04/2020 for multifocal pneumonia and chest pain. Hospitalization was complicated by atrial fibrillation with RVR. Echo showed normal EF with G3DD. Troponin negative x3. Her home medications were adjusted with increased doses of diltiazem and bisprolol. She was continued on PTA anticoagulation. Addition of ASA was recommended although decision was deferred to her primary cardiologist in [place]. Angelica Ferguson tells me that after this hospitalization she fell and broke her hip and the decision was made for patient to move to [facility] with Ferguson permanently. She has not established care with a cardiologist here.   Patient's Ferguson is that the bedside and assists with history. She tells me that for the past 1-2 weeks patient has felt unwell with shortness of breath and cough. She also noted decreased appetite. Her Ferguson also takes vitals at home and noted her HR has been more elevated than normal. She was given steroid and antibiotic by her primary care provider without improvement which prompted her to report to the ED. She denies chest pain, palpitations, nausea, and diaphoresis.   In the ED, BP 148/69, HR 124, RR 26, SpO2 98% on 3 L supplemental O2, and T 98 F. BMP with sodium 118 and glucose 669. CBC with WBC 23 and hgb 9.8.  Lactic 2.1. BNP elevated at 1759. Troponin stable 143>143. EKG shows atrial fibrillation with RVR. CXR with new small right pleural effusion. CT chest with findings concerning for infection vs edema. EMS gave duoneb treatments, solumedrol, started magnesium and diltiazem. In the ED was started on IV antibiotics and IV Lasix.   Past Medical History:  Diagnosis Date   Acute blood loss anemia 12/04/2020   Acute renal failure (HCC) 12/04/2020   Allergic rhinitis 02/20/2023   Atrial fibrillation (HCC)    CAD (coronary artery disease)    Cerebrovascular accident (HCC) 04/26/2015   Chronic kidney disease, stage 2 (mild) 03/30/2023   Closed fracture of right hip requiring operative repair, sequela 11/23/2020   COPD (chronic obstructive pulmonary disease) (HCC)    Coronary stent patent 06/19/2020   Diabetes mellitus without complication (HCC)    Former light tobacco smoker 06/22/2021   GERD (gastroesophageal reflux disease)    H/O TB (tuberculosis)    approx 1969.  Received treatment.   Hip fracture (HCC) 11/16/2020   History of CVA in adulthood 2016   TIA - no deficits   History of heart artery stent    approx 2012   Hyperlipidemia    Hypertension    Malnutrition of moderate degree 11/18/2020   Normocytic anemia 11/03/2022   Pressure injury of skin 11/24/2020   Wears dentures    full upper and lower   Wears hearing aid in both ears     Past Surgical History:  Procedure Laterality Date   CATARACT EXTRACTION Left    approx  2019   CATARACT EXTRACTION W/PHACO Left 04/20/2023   Procedure: CATARACT EXTRACTION PHACO AND INTRAOCULAR LENS PLACEMENT (IOC) LEFT DIABETIC 23.62 01:55.0;  Surgeon: Estanislado Pandy, MD;  Location: [facility];  Service: Ophthalmology;  Laterality: Left;   CORONARY/GRAFT ACUTE MI REVASCULARIZATION     approx 2012   HIP ARTHROPLASTY Right 11/19/2020   Procedure: ARTHROPLASTY BIPOLAR HIP (HEMIARTHROPLASTY);  Surgeon: Juanell Fairly, MD;  Location:  [facility];  Service: Orthopedics;  Laterality: Right;   KNEE SURGERY Left      Home Medications:  Prior to Admission medications   Medication Sig Start Date End Date Taking? Authorizing Provider  albuterol (VENTOLIN HFA) 108 (90 Base) MCG/ACT inhaler Inhale 1 puff into the lungs every 6 (six) hours as needed for wheezing or shortness of breath.   Yes [provider]  atorvastatin (LIPITOR) 40 MG tablet Take 1 tablet by mouth daily. 01/02/23  Yes [provider]  benzonatate (TESSALON) 200 MG capsule Take 200 mg by mouth 3 (three) times daily as needed. 05/11/23  Yes [provider]  bisoprolol (ZEBETA) 5 MG tablet Take 1 tablet (5 mg total) by mouth 2 (two) times daily. Patient taking differently: Take 2.5 mg by mouth daily. 12/02/20  Yes Love, Evlyn Kanner, PA-C  diltiazem (CARDIZEM) 60 MG tablet Take 1 tablet (60 mg total) by mouth 2 (two) times daily. 12/02/20  Yes Love, Evlyn Kanner, PA-C  edoxaban (SAVAYSA) 30 MG TABS tablet Take 1 tablet (30 mg total) by mouth daily. 12/02/20  Yes Love, Evlyn Kanner, PA-C  Fluticasone-Umeclidin-Vilant (TRELEGY ELLIPTA) 100-62.5-25 MCG/ACT AEPB Inhale 1 puff into the lungs daily. 05/11/23 05/10/24 Yes Angelica Confer, MD  metFORMIN (GLUCOPHAGE) 500 MG tablet Take 500 mg by mouth daily.   Yes [provider]  atorvastatin (LIPITOR) 80 MG tablet Take 1 tablet (80 mg total) by mouth every evening. Patient not taking: Reported on 05/22/2023 12/02/20   Love, Evlyn Kanner, PA-C  benzonatate (TESSALON) 100 MG capsule Take 200 mg by mouth every 8 (eight) hours as needed for cough. Patient not taking: Reported on 05/22/2023    [provider]    Inpatient Medications: Scheduled Meds:  atorvastatin  40 mg Oral Daily   benzonatate  200 mg Oral TID   bisoprolol  10 mg Oral Daily   edoxaban  30 mg Oral Daily   fluticasone furoate-vilanterol  1 puff Inhalation Daily   And   umeclidinium bromide  1 puff Inhalation Daily   insulin aspart  0-15  Units Subcutaneous TID WC   insulin aspart  0-5 Units Subcutaneous QHS   methylPREDNISolone (SOLU-MEDROL) injection  80 mg Intravenous Q24H   sodium chloride flush  3 mL Intravenous Q12H   Continuous Infusions:  PRN Meds: acetaminophen, guaiFENesin-dextromethorphan, levalbuterol, metoprolol tartrate, ondansetron (ZOFRAN) IV, sodium chloride flush  Allergies:    Allergies  Allergen Reactions   Sulfa Antibiotics     Blood pressure dropped    Social History:   Social History   Socioeconomic History   Marital status: Widowed    Spouse name: Not on file   Number of children: Not on file   Years of education: Not on file   Highest education level: Not on file  Occupational History   Not on file  Tobacco Use   Smoking status: Former    Current packs/day: 0.00    Average packs/day: 0.5 packs/day for 15.0 years (7.5 ttl pk-yrs)    Types: Cigarettes    Start date: 63  Quit date: 77    Years since quitting: 54.1   Smokeless tobacco: Never  Vaping Use   Vaping status: Never Used  Substance and Sexual Activity   Alcohol use: Not Currently    Comment: Rare   Drug use: Never   Sexual activity: Not Currently  Other Topics Concern   Not on file  Social History Narrative   Not on file   Social Drivers of Health   Financial Resource Strain: Not on file  Food Insecurity: No Food Insecurity (05/23/2023)   Hunger Vital Sign    Worried About Running Out of Food in the Last Year: Never true    Ran Out of Food in the Last Year: Never true  Transportation Needs: No Transportation Needs (05/23/2023)   PRAPARE - Administrator, Civil Service (Medical): No    Lack of Transportation (Non-Medical): No  Physical Activity: Not on file  Stress: Not on file  Social Connections: Moderately Isolated (05/23/2023)   Social Connection and Isolation Panel [NHANES]    Frequency of Communication with Friends and Family: Twice a week    Frequency of Social Gatherings with Friends  and Family: Twice a week    Attends Religious Services: 1 to 4 times per year    Active Member of [employer] or Organizations: No    Attends Banker Meetings: Never    Marital Status: Widowed  Intimate Partner Violence: Not At Risk (05/23/2023)   Humiliation, Afraid, Rape, and Kick questionnaire    Fear of Current or Ex-Partner: No    Emotionally Abused: No    Physically Abused: No    Sexually Abused: No    Family History:    Family History  Problem Relation Age of Onset   Heart disease Sister    Cancer Sister      ROS:  Please see the history of present illness.   Physical Exam/Data:   Vitals:   05/23/23 2329 05/24/23 0422 05/24/23 0500 05/24/23 0725  BP: 125/70 131/85  121/73  Pulse: (!) 109 88  80  Resp: 18 18    Temp: 98.1 F (36.7 C) 98.6 F (37 C)  (!) 97.4 F (36.3 C)  TempSrc: Oral     SpO2: 97% 99%  96%  Weight:   53 kg   Height:       No intake or output data in the 24 hours ending 05/24/23 0854    05/24/2023    5:00 AM 05/22/2023    9:09 AM 05/05/2023   12:41 PM  Last 3 Weights  Weight (lbs) 116 lb 13.5 oz 114 lb 10.2 oz 114 lb  Weight (kg) 53 kg 52 kg 51.71 kg     Body mass index is 19.44 kg/m.  General:  Well nourished, well developed, in no acute distress HEENT: normal Neck: no JVD Cardiac:  normal S1, S2; IRIR, tachycardic; no murmur  Lungs:  clear to auscultation bilaterally, no wheezing, rhonchi or rales  Abd: soft, nontender, no hepatomegaly  Ext: no edema Skin: warm and dry  Psych:  Normal affect   EKG:  The EKG was personally reviewed and demonstrates:  atrial fibrillation, rate 119 bpm Telemetry:  Telemetry was personally reviewed and demonstrates:  atrial fibrillation with occasional PVCs, rate 100-125 bpm  Relevant CV Studies:  05/23/2023 TTE  1. Left ventricular ejection fraction, by estimation, is 25 to 30%. The  left ventricle has severely decreased function. Left ventricular  endocardial border not optimally defined to  evaluate regional  wall motion.  Left ventricular diastolic function could   not be evaluated.   2. Right ventricular systolic function is normal. The right ventricular  size is normal. There is normal pulmonary artery systolic pressure.   3. Left atrial size was mildly dilated.   4. The mitral valve is degenerative. Mild to moderate mitral valve  regurgitation.   5. The aortic valve is tricuspid. There is mild calcification of the  aortic valve. There is mild thickening of the aortic valve. Aortic valve  regurgitation not well assessed. Aortic valve gradient not well assessed.  04/2020 TTE [facility], official results not available) Normal EF with grade 3 diastolic dysfunction and pulmonary hypertension  Laboratory Data:  High Sensitivity Troponin:   Recent Labs  Lab 05/22/23 0900 05/22/23 1115  TROPONINIHS 143* 143*     Chemistry Recent Labs  Lab 05/22/23 1330 05/22/23 2142 05/23/23 0615 05/24/23 0356  NA  --  135 138 139  K  --  4.7 5.0 4.8  CL  --  98 104 97*  CO2  --  21* 27 31  GLUCOSE  --  194* 149* 163*  BUN  --  49* 50* 59*  CREATININE  --  1.17* 1.05* 0.96  CALCIUM  --  8.7* 8.0* 8.8*  MG 3.1*  --   --   --   GFRNONAA  --  45* 51* 57*  ANIONGAP  --  16* 7 11    No results for input(s): "PROT", "ALBUMIN", "AST", "ALT", "ALKPHOS", "BILITOT" in the last 168 hours. Lipids No results for input(s): "CHOL", "TRIG", "HDL", "LABVLDL", "LDLCALC", "CHOLHDL" in the last 168 hours.  Hematology Recent Labs  Lab 05/22/23 1004 05/22/23 2142 05/24/23 0356  WBC 23.0*  --  20.0*  RBC 4.36 4.59 4.25  HGB 9.8*  --  9.6*  HCT 34.0*  --  31.8*  MCV 78.0*  --  74.8*  MCH 22.5*  --  22.6*  MCHC 28.8*  --  30.2  RDW 18.4*  --  18.2*  PLT 518*  --  399   Thyroid  Recent Labs  Lab 05/22/23 1115  TSH 0.887    BNP Recent Labs  Lab 05/22/23 1004  BNP 1,759.2*    DDimer No results for input(s): "DDIMER" in the last 168 hours.   Radiology/Studies:   ECHOCARDIOGRAM COMPLETE Result Date: 05/23/2023 IMPRESSIONS  1. Left ventricular ejection fraction, by estimation, is 25 to 30%. The left ventricle has severely decreased function. Left ventricular endocardial border not optimally defined to evaluate regional wall motion. Left ventricular diastolic function could  not be evaluated.  2. Right ventricular systolic function is normal. The right ventricular size is normal. There is normal pulmonary artery systolic pressure.  3. Left atrial size was mildly dilated.  4. The mitral valve is degenerative. Mild to moderate mitral valve regurgitation.  5. The aortic valve is tricuspid. There is mild calcification of the aortic valve. There is mild thickening of the aortic valve. Aortic valve regurgitation not well assessed. Aortic valve gradient not well assessed. Electronically signed by Yvonne Kendall MD Signature Date/Time: 05/23/2023/6:11:31 PM    Final    DG Chest 1 View Result Date: 05/23/2023 IMPRESSION: 1. Atelectasis and/or consolidation in the right lung base with small right pleural effusion. 2. The appearance of the lungs suggests probable chronic bronchitis. 3. Aortic atherosclerosis. Electronically Signed   By: Trudie Reed M.D.   On: 05/23/2023 07:17   CT Angio Chest PE W and/or Wo Contrast Result Date:  05/22/2023 IMPRESSION: 1. No evidence of pulmonary embolism. 2. Small layering bilateral pleural effusions. 3. Ground-glass opacity bilaterally most pronounced within the perihilar regions and upper lobes, which may represent pulmonary edema or atypical infection. 4. Multiple scattered pulmonary nodules measuring up to 8 mm in size. Non-contrast chest CT at 3-6 months is recommended. If the nodules are stable at time of repeat CT, then future CT at 18-24 months (from today's scan) is considered optional for low-risk patients, but is recommended for high-risk patients. This recommendation follows the consensus statement: Guidelines for Management of  Incidental Pulmonary Nodules Detected on CT Images: From the Fleischner Society 2017; Radiology 2017; 284:228-243. 5. Mildly enlarged mediastinal and right hilar lymph nodes, which may be reactive. 6. Subacute or chronic appearing mild inferior endplate compression deformity of L1. Correlate with point tenderness. 7. Aortic and coronary artery atherosclerosis (ICD10-I70.0). Electronically Signed   By: Duanne Guess D.O.   On: 05/22/2023 12:49   DG Chest Portable 1 View Result Date: 05/22/2023 CLINICAL DATA:  Shortness of breath EXAM: PORTABLE CHEST 1 VIEW COMPARISON:  11/21/2020 FINDINGS: Normal heart size. Aortic atherosclerosis. Mildly hyperinflated lungs. Persistently coarsened interstitial markings, most pronounced within the upper lobes. New small right pleural effusion. No pneumothorax. IMPRESSION: 1. New small right pleural effusion. 2. Persistently coarsened interstitial markings, most pronounced within the upper lobes. Electronically Signed   By: Duanne Guess D.O.   On: 05/22/2023 09:47     Assessment and Plan:   Acute hypoxic respiratory failure Acute COPD exacerbation on chronic COPD - Patient presented with worsening shortness of breath for the past 1-2 weeks - Previously on BiPAP, now weaned to nasal cannula - Management per IM  Acute CHF decompensation - Echo 04/2020 with normal LV function and G3DD - Echo shows LVEF 25-30% - Received IV Lasix 60 mg followed by 40 mg with no output recorded - Appears euvolemic on exam - Cr 0.96, improved some with diuresis - Will continue IV Lasix 40 mg and reassess for need daily - Continue bisoprolol 10 mg daily - Plan to escalate GDMT as able  Atrial fibrillation with RVR - Longstanding history of permanent atrial fibrillation per patient and Ferguson, typically rate controlled - CHA2DS2VASc 7 - Upon arrival patient was found to be in atrial fibrillation with RVR, rates up to 150s - Given diltiazem bolus and drip. Given acute CHF  decompensation, diltiazem was discontinued and started on IV digoxin which was later discontinued for unclear reason - Remains in atrial fibrillation with rate 100-120 bpm - Will start Digoxin 0.125 daily for further rate control - Continue bisoprolol 10 mg daily, will add an additional 5 mg dose this evening. Can consider 10 mg BID based on rate/BP response to digoxin. - Continue PTA edoxaban 30 mg daily  CAD Elevated troponin - Troponin peaked at 143 - No ischemic changes on EKG - Suspect demand ischemia in the setting of acute hypoxic respiratory failure and atrial fibrillation with RVR - Discussed options for ischemic evaluation with Ferguson and patient. At this time she is not interested in cardiac catheterization or further ischemic evaluation.  T2DM - Management per IM  For questions or updates, please contact [facility] Please consult [email or web address] for contact info under    Signed, Orion Crook, PA-C  05/24/2023 8:54 AM

## 2023-05-24 NOTE — Progress Notes (Signed)
Heart Failure Stewardship Pharmacy Note  PCP: Angelica Confer, MD PCP-Cardiologist: None  HPI: Angelica Ferguson is a 89 y.o. female with PAF, COPD, CAD status post stenting in 2012, type 2 diabetes who presented with worsening cough and shortness of breath over ~1-2 weeks. Recently seen by PCP prior to presentation who prescribed steroids and doxycycline for a COPD exacerbation. 2 days prior to presentation, Angelica Ferguson noticed ankle edema bilaterally. HS-troponin was 143. BNP on admission was 1759.2. Lactic acid on admission was 2.1 trending down to 1.6. Iron labs showed low TSAT and Ferritin, meeting criteria for IV iron. Chest XR on admission showed small right pleural effusion. CT concerning for infection vs edema. Echocardiogram this admission showed LVEF of 25-30% with mild-moderate MR.  Pertinent Lab Values: Creatinine, Ser  Date Value Ref Range Status  05/24/2023 0.96 0.44 - 1.00 mg/dL Final   BUN  Date Value Ref Range Status  05/24/2023 59 (H) 8 - 23 mg/dL Final   Potassium  Date Value Ref Range Status  05/24/2023 4.8 3.5 - 5.1 mmol/L Final   Sodium  Date Value Ref Range Status  05/24/2023 139 135 - 145 mmol/L Final   B Natriuretic Peptide  Date Value Ref Range Status  05/22/2023 1,759.2 (H) 0.0 - 100.0 pg/mL Final    Comment:    Performed at [facility], [address] [place], [place] [address]   Magnesium  Date Value Ref Range Status  05/22/2023 3.1 (H) 1.7 - 2.4 mg/dL Final    Comment:    Performed at [facility], [address] [place], [place] [address]   Hgb A1c MFr Bld  Date Value Ref Range Status  05/22/2023 6.5 (H) 4.8 - 5.6 % Final    Comment:    (NOTE) Pre diabetes:          5.7%-6.4%  Diabetes:              >6.4%  Glycemic control for   <7.0% adults with diabetes    TSH  Date Value Ref Range Status  05/22/2023 0.887 0.350 - 4.500 uIU/mL Final    Comment:    Performed by a 3rd Generation assay with a functional  sensitivity of <=0.01 uIU/mL. Performed at [facility], [address] [place], [place] [address]     Vital Signs: Temp:  [97.4 F (36.3 C)-98.6 F (37 C)] 98 F (36.7 C) (01/29 1115) Pulse Rate:  [80-109] 90 (01/29 1115) Cardiac Rhythm: Atrial fibrillation (01/29 0700) Resp:  [15-22] 15 (01/29 1115) BP: (96-131)/(59-85) 128/59 (01/29 1115) SpO2:  [84 %-100 %] 99 % (01/29 1115) FiO2 (%):  [32 %] 32 % (01/28 1951) Weight:  [53 kg (116 lb 13.5 oz)] 53 kg (116 lb 13.5 oz) (01/29 0500) No intake or output data in the 24 hours ending 05/24/23 1407  Current Heart Failure Medications:  Loop diuretic: furosemide 40 mg IV once Beta-Blocker: bisoprolol 10 mg daily ACEI/ARB/ARNI: none MRA: none SGLT2i: none Other: digoxin 0.125 mg daily  Prior to admission Heart Failure Medications:  Loop diuretic: none Beta-Blocker: bisoprolol 5 mg twice daily ACEI/ARB/ARNI: none MRA: none SGLT2i: none Other: diltiazem 60 mg twice daily.  Assessment: 1. 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 NYHA class III symptoms.  -Symptoms: Reports shortness of breath and LEE are improved. Has persistent orthopnea at baseline. Appetite is fair, she just requires that her food be very hot. -Volume: Appears nearly euvolemic. Received 60  mg IV, then 40 mg IV yesterday, and will receive 40 mg IV today. Suspect volume will be under better control when rates are at goal. -Hemodynamics: BP is normal. HR is persistent in the low 100s.  -BB: Bisoprolol increased to 10 mg daily. -ACEI/ARB/ARNI: May benefit from starting low dose losartan depending on how aggressive the patient would like to be in care. -MRA: Given potassium has been close to 5, would avoid adding at this time. Renal function is stable.  -SGLT2i: No history of UTI noted. Would benefit HF, DM, and CKD. May be worth adding if patient would like to pursue aggressive care. -Would acquire  digoxin level 05/27/23. Will need to monitor closely given accumulation in the elderly.  -Will monitor rate control with atrial fibrillation.  Plan: 1) Recommendations: -Consider checking digoxin level on 05/27/23.   2) Patient assistance: -Chauncey Mann requires Prior Authorization, [name] not on formulary, Jardiance copay is $40.00, Eliquis copay is $40.00, Xarelto copay is $40.00, Savaysa copay is $418.57   3) Education: -To be completed prior to discharge. Will wait until after cardiology has discussed echo results before providing heart failure education.  Medication Assistance / Insurance Benefits Check: Does the patient have prescription insurance?    Type of insurance plan:  Does the patient qualify for medication assistance through manufacturers or grants? Pending   Outpatient Pharmacy: Prior to admission outpatient [facility]     Please do not hesitate to reach out with questions or concerns,  Enos Fling, PharmD, CPP, BCPS Heart Failure Pharmacist  Phone - [phone] 05/24/2023 2:08 PM

## 2023-05-24 NOTE — Assessment & Plan Note (Signed)
Demand ischemia from acute respiratory failure and heart failure exacerbation.

## 2023-05-24 NOTE — Progress Notes (Signed)
Progress Note   Patient: Angelica Ferguson LOV:[id number] DOB: [birth year] DOA: 05/22/2023     2 DOS: the patient was seen and examined on 05/24/2023   Brief hospital course: Hospital course / significant events:   HPI: Angelica Ferguson is a very pleasant 89 y.o. female with medical history significant of PAF, COPD, CAD status post stenting, DM2, brought in by family members for worsening cough, wheezing, shortness of breath, started 1 week prior, PCP tx for COPD exac, given doxy rx, inhalers, tessalon. Noted edema bilateral ankles x2 days. Daughter also reported that last 2 days, a wearable telemonitoring device on patient's arm show " A-fib."  As compared to baseline patient has rate controlled A-fib usually heart rate in the 70-80 range.  01/28: Chest x-ray showed pulmonary congestion. CTA showed negative for PE but bilateral multifocal infiltrates considered to be CHF versus multifocal pneumonia. BNP 1760. Afib RVR HR 130-150s, IV Cardizem and started on a drip, and was given IV Lasix 60 mg x 1 in the ED. Admitted to hospitalist, d/c cardizem, ordered IV digoxin, ordered prn metoprolol  1/29.  Patient feeling little bit better.  Echocardiogram showed an EF of 25 to 30%.     Assessment and Plan: Acute on chronic systolic CHF (congestive heart failure) (HCC) Patient on bisoprolol to control heart rate.  Will add low-dose Cozaar.  Patient received a dose of IV Lasix today.  Will consult cardiology with EF of 25 to 30%.  Acute hypoxic respiratory failure (HCC) Pulse ox of 84% today on room air.  Continue to try to taper off oxygen.  COPD with acute exacerbation (HCC) Continue Breo and Incruse inhalers and to taper her Solu-Medrol to 40 mg daily.  Atrial fibrillation with RVR (HCC) Patient on digoxin and bisoprolol.  Anticoagulated with edoxaban.  Essential hypertension Continue bisoprolol.  Add low-dose Cozaar.  Plan to add Aldactone tomorrow if creatinine okay.  Iron deficiency  anemia Hemoglobin 9.6 with a ferritin of 23.  Uncontrolled type 2 diabetes mellitus with hyperglycemia, with long-term current use of insulin (HCC) Hemoglobin A1c 6.5.  Will taper steroids.  Continue sliding scale.  Myocardial injury Demand ischemia from acute respiratory failure and heart failure exacerbation.      Subjective: Patient feels a little bit better today.  Still with some shortness of breath.  Echocardiogram shows an EF of 25 to 30%.  Received a dose of IV Lasix today.  Physical Exam: Vitals:   05/24/23 0900 05/24/23 0905 05/24/23 1029 05/24/23 1115  BP:    (!) 128/59  Pulse:   (!) 106 90  Resp:    15  Temp:    98 F (36.7 C)  TempSrc:      SpO2: (!) 84% 94% 94% 99%  Weight:      Height:       Physical Exam HENT:     Head: Normocephalic.     Mouth/Throat:     Pharynx: No oropharyngeal exudate.  Eyes:     General: Lids are normal.     Conjunctiva/sclera: Conjunctivae normal.  Cardiovascular:     Rate and Rhythm: Tachycardia present. Rhythm irregularly irregular.     Heart sounds: Normal heart sounds, S1 normal and S2 normal.  Pulmonary:     Breath sounds: Examination of the right-lower field reveals decreased breath sounds and rales. Examination of the left-lower field reveals decreased breath sounds and rales. Decreased breath sounds and rales present. No wheezing or rhonchi.  Abdominal:     Palpations: Abdomen is soft.  Tenderness: There is no abdominal tenderness.  Musculoskeletal:     Right lower leg: No swelling.     Left lower leg: No swelling.  Skin:    General: Skin is warm.     Findings: No rash.  Neurological:     Mental Status: She is alert and oriented to person, place, and time.     Data Reviewed: EF of 25 to 30%, creatinine 0.96, potassium 4.8, white blood cell count 20, hemoglobin 9.6, platelet count 399  Family Communication: Updated daughter on the phone  Disposition: Status is: Inpatient Remains inpatient appropriate  because: Trying to see if we can get off oxygen.  Pulse ox 84% on room air today.  EF 25 to 30%.  Received a dose of IV Lasix today.  Planned Discharge Destination: Home    Time spent: 28 minutes  Author: Alford Highland, MD 05/24/2023 2:37 PM  For on call review [email or web address].

## 2023-05-25 ENCOUNTER — Encounter: Payer: Self-pay | Admitting: Osteopathic Medicine

## 2023-05-25 DIAGNOSIS — J810 Acute pulmonary edema: Secondary | ICD-10-CM | POA: Diagnosis not present

## 2023-05-25 DIAGNOSIS — R7989 Other specified abnormal findings of blood chemistry: Secondary | ICD-10-CM | POA: Diagnosis not present

## 2023-05-25 DIAGNOSIS — I420 Dilated cardiomyopathy: Secondary | ICD-10-CM | POA: Diagnosis not present

## 2023-05-25 DIAGNOSIS — I4891 Unspecified atrial fibrillation: Secondary | ICD-10-CM | POA: Diagnosis not present

## 2023-05-25 LAB — BASIC METABOLIC PANEL
Anion gap: 11 (ref 5–15)
BUN: 59 mg/dL — ABNORMAL HIGH (ref 8–23)
CO2: 33 mmol/L — ABNORMAL HIGH (ref 22–32)
Calcium: 8.6 mg/dL — ABNORMAL LOW (ref 8.9–10.3)
Chloride: 93 mmol/L — ABNORMAL LOW (ref 98–111)
Creatinine, Ser: 0.81 mg/dL (ref 0.44–1.00)
GFR, Estimated: >60 mL/min (ref >60)
Glucose, Bld: 142 mg/dL — ABNORMAL HIGH (ref 70–99)
Potassium: 3.9 mmol/L (ref 3.5–5.1)
Sodium: 137 mmol/L (ref 135–145)

## 2023-05-25 LAB — GLUCOSE, CAPILLARY
Glucose-Capillary: 125 mg/dL — ABNORMAL HIGH (ref 70–99)
Glucose-Capillary: 135 mg/dL — ABNORMAL HIGH (ref 70–99)
Glucose-Capillary: 203 mg/dL — ABNORMAL HIGH (ref 70–99)
Glucose-Capillary: 179 mg/dL — ABNORMAL HIGH (ref 70–99)

## 2023-05-25 LAB — MYCOPLASMA PNEUMONIAE ANTIBODY, IGM: Mycoplasma pneumo IgM: <770 U/mL (ref 0–769)

## 2023-05-25 MED ORDER — FUROSEMIDE 10 MG/ML IJ SOLN
40.0000 mg | Freq: Every day | INTRAMUSCULAR | Status: DC
  Administered 2023-05-25 – 2023-05-26 (×2): 40 mg via INTRAVENOUS
  Filled 2023-05-25 (×2): qty 4

## 2023-05-25 MED ORDER — SALINE SPRAY 0.65 % NA SOLN
1.0000 | NASAL | Status: DC | PRN
  Filled 2023-05-25: qty 44

## 2023-05-25 MED ORDER — BISOPROLOL FUMARATE 5 MG PO TABS
10.0000 mg | ORAL_TABLET | Freq: Two times a day (BID) | ORAL | Status: DC
  Administered 2023-05-25 – 2023-05-26 (×2): 10 mg via ORAL
  Filled 2023-05-25 (×3): qty 2

## 2023-05-25 NOTE — Progress Notes (Signed)
Mobility Specialist - Progress Note   Pre-mobility:SpO2 95 on 3L, 90 on RA During mobility:SpO2 77 on RA; put back on 3L 89% Post-mobility: SPO2 on 3L 95     05/25/23 1025  Mobility  Activity Ambulated with assistance in hallway  Level of Assistance Standby assist, set-up cues, supervision of patient - no hands on  Assistive Device Front wheel walker  Distance Ambulated (ft) 160 ft  Range of Motion/Exercises Active  Activity Response Tolerated well  Mobility Referral Yes  Mobility visit 1 Mobility  Mobility Specialist Start Time (ACUTE ONLY) 1000  Mobility Specialist Stop Time (ACUTE ONLY) 1021  Mobility Specialist Time Calculation (min) (ACUTE ONLY) 21 min   Pt resting in bed on 3L upon entry. Pt STS and ambulates to hallway on RA with RW SBA  for 30 ft Pt immediately dropped to 77%; Pt put back on 3L to recover SpO2 89. Pt remained asymptotic the entire time. Pt returned to bed and left with needs in reach, bed alarm activated on 3L. 95% SpO2.   Johnathan Hausen Mobility Specialist 05/25/23, 10:44 AM

## 2023-05-25 NOTE — Progress Notes (Signed)
Heart Failure Nurse Navigator Progress Note  PCP: Jackolyn Confer, MD PCP-Cardiologist: Antonieta Iba, MD Admission Diagnosis:  Atrial fibrillation with RVR (HCC) Acute pulmonary edema (HCC) Elevated Troponin Admitted from: Home via EMS  Presentation:   [facility] presented with shortness of breath, dry cough and wheezing. Patient also noticed fluid buildup in bilateral ankles.  Hx of COPD and has been using inhalers at home. Patient also noted  to be in Afib with RVR by EMS. BNP 1,759.2. Troponin was 143.  ECHO/ LVEF: 25-30%  Clinical Course:  Past Medical History:  Diagnosis Date   Acute blood loss anemia 12/04/2020   Acute renal failure (HCC) 12/04/2020   Allergic rhinitis 02/20/2023   Atrial fibrillation (HCC)    CAD (coronary artery disease)    Cerebrovascular accident (HCC) 04/26/2015   Chronic kidney disease, stage 2 (mild) 03/30/2023   Closed fracture of right hip requiring operative repair, sequela 11/23/2020   COPD (chronic obstructive pulmonary disease) (HCC)    Coronary stent patent 06/19/2020   Diabetes mellitus without complication (HCC)    Former light tobacco smoker 06/22/2021   GERD (gastroesophageal reflux disease)    H/O TB (tuberculosis)    approx 1969.  Received treatment.   Hip fracture (HCC) 11/16/2020   History of CVA in adulthood 2016   TIA - no deficits   History of heart artery stent    approx 2012   Hyperlipidemia    Hypertension    Malnutrition of moderate degree 11/18/2020   Normocytic anemia 11/03/2022   Pressure injury of skin 11/24/2020   Wears dentures    full upper and lower   Wears hearing aid in both ears      Social History   Socioeconomic History   Marital status: Widowed    Spouse name: Not on file   Number of children: Not on file   Years of education: Not on file   Highest education level: Not on file  Occupational History   Not on file  Tobacco Use   Smoking status: Former    Current packs/day: 0.00     Average packs/day: 0.5 packs/day for 15.0 years (7.5 ttl pk-yrs)    Types: Cigarettes    Start date: 65    Quit date: 68    Years since quitting: 54.1   Smokeless tobacco: Never  Vaping Use   Vaping status: Never Used  Substance and Sexual Activity   Alcohol use: Not Currently    Comment: Rare   Drug use: Never   Sexual activity: Not Currently  Other Topics Concern   Not on file  Social History Narrative   Not on file   Social Drivers of Health   Financial Resource Strain: Not on file  Food Insecurity: No Food Insecurity (05/23/2023)   Hunger Vital Sign    Worried About Running Out of Food in the Last Year: Never true    Ran Out of Food in the Last Year: Never true  Transportation Needs: No Transportation Needs (05/23/2023)   PRAPARE - Administrator, Civil Service (Medical): No    Lack of Transportation (Non-Medical): No  Physical Activity: Not on file  Stress: Not on file  Social Connections: Moderately Isolated (05/23/2023)   Social Connection and Isolation Panel [NHANES]    Frequency of Communication with Friends and Family: Twice a week    Frequency of Social Gatherings with Friends and Family: Twice a week    Attends Religious Services: 1 to 4 times per  year    Active Member of Clubs or Organizations: No    Attends Banker Meetings: Never    Marital Status: Widowed   Education Assessment and Provision:  Detailed education and instructions provided on heart failure disease management including the following:  Signs and symptoms of Heart Failure When to call the physician Importance of daily weights Low sodium diet Fluid restriction Medication management Anticipated future follow-up appointments  Patient education given on each of the above topics.  Patient acknowledges understanding via teach back method and acceptance of all instructions.  Education Materials:  "Living Better With Heart Failure" Booklet, HF zone tool, & Daily Weight  Tracker Tool.  Patient has scale at home: Yes Patient has pill box at home: Yes    High Risk Criteria for Readmission and/or Poor Patient Outcomes: Heart failure hospital admissions (last 6 months): 0  No Show rate: 0 Difficult social situation: None Demonstrates medication adherence: Yes Primary Language: English Literacy level: Reading, writing and comprehension  Barriers of Care:   Diet & Fluid Restrictions Daily weights Medication compliance  Considerations/Referrals:   Referral made to Heart Failure Pharmacist Stewardship: Yes Referral made to Heart Failure CSW/NCM TOC: No Referral made to Heart & Vascular TOC clinic: 06/02/23 @ 10:30  Items for Follow-up on DC/TOC: Diet & Fluid Restrictions- Per daughter patient does not go over 64 oz of total fluid a day. She does not have a high sodium diet but does like [employer] potato chips a few times a day but daughter still thinks she is under the 1500 mg of salt a day even with chips. Daily weights Medication Compliance-pt is great at taking her meds Family had requested home oxygen for outings for discharge Continued HF education  Roxy Horseman, Charity fundraiser, BSN [facility] Heart Failure Navigator Secure Chat Only

## 2023-05-25 NOTE — Progress Notes (Signed)
Rounding Note    Patient Name: Angelica Ferguson Date of Encounter: 05/25/2023  [facility] Cardiologist: None   Subjective   Patient is feeling better today. Working to wean oxygen today. She remains in atrial fibrillation with rates improving today 90-100 bpm.  Inpatient Medications    Scheduled Meds:  atorvastatin  40 mg Oral Daily   benzonatate  200 mg Oral TID   bisoprolol  10 mg Oral BID   digoxin  0.125 mg Oral Daily   edoxaban  30 mg Oral Daily   fluticasone furoate-vilanterol  1 puff Inhalation Daily   And   umeclidinium bromide  1 puff Inhalation Daily   furosemide  40 mg Intravenous Daily   insulin aspart  0-15 Units Subcutaneous TID WC   insulin aspart  0-5 Units Subcutaneous QHS   losartan  25 mg Oral QHS   methylPREDNISolone (SOLU-MEDROL) injection  40 mg Intravenous Q24H   sodium chloride flush  3 mL Intravenous Q12H   Continuous Infusions:  PRN Meds: acetaminophen, guaiFENesin-dextromethorphan, levalbuterol, metoprolol tartrate, ondansetron (ZOFRAN) IV, sodium chloride flush   Vital Signs    Vitals:   05/25/23 0028 05/25/23 0353 05/25/23 0530 05/25/23 0806  BP: 118/71 121/63  135/80  Pulse: 96 87  79  Resp: 20 18  18   Temp: 97.6 F (36.4 C) (!) 97.4 F (36.3 C)  98 F (36.7 C)  TempSrc: Oral Oral    SpO2: 100% 98%  98%  Weight:   50.3 kg   Height:        Intake/Output Summary (Last 24 hours) at 05/25/2023 0931 Last data filed at 05/25/2023 0530 Gross per 24 hour  Intake --  Output 1050 ml  Net -1050 ml      05/25/2023    5:30 AM 05/24/2023    5:00 AM 05/22/2023    9:09 AM  Last 3 Weights  Weight (lbs) 110 lb 14.3 oz 116 lb 13.5 oz 114 lb 10.2 oz  Weight (kg) 50.3 kg 53 kg 52 kg      Telemetry    Atrial fibrillation rate 90-100 bpm - Personally Reviewed  Physical Exam   GEN: No acute distress.   Neck: No JVD Cardiac: IRIR, no murmurs, rubs, or gallops.  Respiratory: Decreased breath sounds and crackles noted  bilaterally GI: Soft, nontender, non-distended  MS: No edema; No deformity. Neuro:  Nonfocal  Psych: Normal affect   Labs    High Sensitivity Troponin:   Recent Labs  Lab 05/22/23 0900 05/22/23 1115  TROPONINIHS 143* 143*     Chemistry Recent Labs  Lab 05/22/23 1330 05/22/23 2142 05/23/23 0615 05/24/23 0356 05/25/23 0527  NA  --    < > 138 139 137  K  --    < > 5.0 4.8 3.9  CL  --    < > 104 97* 93*  CO2  --    < > 27 31 33*  GLUCOSE  --    < > 149* 163* 142*  BUN  --    < > 50* 59* 59*  CREATININE  --    < > 1.05* 0.96 0.81  CALCIUM  --    < > 8.0* 8.8* 8.6*  MG 3.1*  --   --   --   --   GFRNONAA  --    < > 51* 57* >60  ANIONGAP  --    < > 7 11 11    < > = values in this interval not displayed.  Lipids No results for input(s): "CHOL", "TRIG", "HDL", "LABVLDL", "LDLCALC", "CHOLHDL" in the last 168 hours.  Hematology Recent Labs  Lab 05/22/23 1004 05/22/23 2142 05/24/23 0356  WBC 23.0*  --  20.0*  RBC 4.36 4.59 4.25  HGB 9.8*  --  9.6*  HCT 34.0*  --  31.8*  MCV 78.0*  --  74.8*  MCH 22.5*  --  22.6*  MCHC 28.8*  --  30.2  RDW 18.4*  --  18.2*  PLT 518*  --  399   Thyroid  Recent Labs  Lab 05/22/23 1115  TSH 0.887    BNP Recent Labs  Lab 05/22/23 1004  BNP 1,759.2*    DDimer No results for input(s): "DDIMER" in the last 168 hours.   Radiology    ECHOCARDIOGRAM COMPLETE Result Date: 05/23/2023 IMPRESSIONS  1. Left ventricular ejection fraction, by estimation, is 25 to 30%. The left ventricle has severely decreased function. Left ventricular endocardial border not optimally defined to evaluate regional wall motion. Left ventricular diastolic function could  not be evaluated.  2. Right ventricular systolic function is normal. The right ventricular size is normal. There is normal pulmonary artery systolic pressure.  3. Left atrial size was mildly dilated.  4. The mitral valve is degenerative. Mild to moderate mitral valve regurgitation.  5. The aortic  valve is tricuspid. There is mild calcification of the aortic valve. There is mild thickening of the aortic valve. Aortic valve regurgitation not well assessed. Aortic valve gradient not well assessed. Electronically signed by Yvonne Kendall MD Signature Date/Time: 05/23/2023/6:11:31 PM    Final     Cardiac Studies   05/23/2023 TTE  1. Left ventricular ejection fraction, by estimation, is 25 to 30%. The  left ventricle has severely decreased function. Left ventricular  endocardial border not optimally defined to evaluate regional wall motion.  Left ventricular diastolic function could   not be evaluated.   2. Right ventricular systolic function is normal. The right ventricular  size is normal. There is normal pulmonary artery systolic pressure.   3. Left atrial size was mildly dilated.   4. The mitral valve is degenerative. Mild to moderate mitral valve  regurgitation.   5. The aortic valve is tricuspid. There is mild calcification of the  aortic valve. There is mild thickening of the aortic valve. Aortic valve  regurgitation not well assessed. Aortic valve gradient not well assessed.   04/2020 TTE [facility], official results not available) Normal EF with grade 3 diastolic dysfunction and pulmonary hypertension  Patient Profile     Annalycia Done is a 89 y.o. female with a hx of paroxysmal atrial fibrillation, COPD, CAD s/p stenting 2012, and T2DM who is being seen for the continued evaluation of CHF and atrial fibrillation   Assessment & Plan    Acute CHF decompensation - Echo 04/2020 with normal LV function and G3DD - Echo this admission shows LVEF 25-30% - Received IV Lasix 60 mg followed by 40 mg x2 days with net output -1.55 L - Appears euvolemic on exam - Cr continues to improve with diuresis - Will continue IV Lasix 40 mg and reassess daily  - Continue bisoprolol 10 mg daily and losartan 25 mg daily - Consider addition of spironolactone at discharge if BP allows for  optimal GDMT  Atrial fibrillation with RVR - Longstanding history of permanent atrial fibrillation per patient and daughter, typically rate controlled - CHA2DS2VASc 7 - Upon arrival patient was found to be in atrial fibrillation with RVR,  rates up to 150s - Given diltiazem bolus and drip. Given acute CHF decompensation, diltiazem later d/c and started on IV digoxin which was later d/c for unclear reason - Remains in atrial fibrillation with rate improved 90-100 bpm - Continue digoxin 0.125 daily and PTA edoxaban 30 mg daily - Will increase bisoprolol to 10 mg BID for further rate control  Acute hypoxic respiratory failure  Acute COPD exacerbation on chronic COPD - Patient presented 1/27 with worsening shortness of breath for the past 1-2 weeks - Previously on BiPAP, now weaned to nasal cannula - Management per IM  CAD Elevated troponin - Troponin peaked at 143 - No ischemic changes on EKG - Suspect demand ischemia in the setting of acute hypoxic respiratory failure and atrial fibrillation with RVR - Discussed options for ischemic evaluation with daughter and patient. At this time she is not interested in cardiac catheterization or further ischemic evaluation.  T2DM - Management per IM  For questions or updates, please contact [facility] Please consult [email or web address] for contact info under        Signed, Orion Crook, PA-C  05/25/2023, 9:31 AM

## 2023-05-25 NOTE — Progress Notes (Signed)
Heart Failure Stewardship Pharmacy Note  PCP: Angelica Confer, MD PCP-Cardiologist: None  HPI: Angelica Ferguson is a 89 y.o. female with PAF, COPD, CAD status post stenting in 2012, type 2 diabetes who presented with worsening cough and shortness of breath over ~1-2 weeks. Recently seen by PCP prior to presentation who prescribed steroids and doxycycline for a COPD exacerbation. 2 days prior to presentation, Angelica Ferguson noticed ankle edema bilaterally. HS-troponin was 143. BNP on admission was 1759.2. Lactic acid on admission was 2.1 trending down to 1.6. Iron labs showed low TSAT and Ferritin, meeting criteria for IV iron. Chest XR on admission showed small right pleural effusion. CT concerning for infection vs edema. Echocardiogram this admission showed LVEF of 25-30% with mild-moderate MR.  Pertinent Lab Values: Creatinine, Ser  Date Value Ref Range Status  05/25/2023 0.81 0.44 - 1.00 mg/dL Final   BUN  Date Value Ref Range Status  05/25/2023 59 (H) 8 - 23 mg/dL Final   Potassium  Date Value Ref Range Status  05/25/2023 3.9 3.5 - 5.1 mmol/L Final   Sodium  Date Value Ref Range Status  05/25/2023 137 135 - 145 mmol/L Final   B Natriuretic Peptide  Date Value Ref Range Status  05/22/2023 1,759.2 (H) 0.0 - 100.0 pg/mL Final    Comment:    Performed at [facility], [address] [place], [place] [address]   Magnesium  Date Value Ref Range Status  05/22/2023 3.1 (H) 1.7 - 2.4 mg/dL Final    Comment:    Performed at [facility], [address] [place], [place] [address]   Hgb A1c MFr Bld  Date Value Ref Range Status  05/22/2023 6.5 (H) 4.8 - 5.6 % Final    Comment:    (NOTE) Pre diabetes:          5.7%-6.4%  Diabetes:              >6.4%  Glycemic control for   <7.0% adults with diabetes    TSH  Date Value Ref Range Status  05/22/2023 0.887 0.350 - 4.500 uIU/mL Final    Comment:    Performed by a 3rd Generation assay with a functional  sensitivity of <=0.01 uIU/mL. Performed at [facility], [address] [place], [place] [address]     Vital Signs: Temp:  [97.4 F (36.3 C)-98.3 F (36.8 C)] 98 F (36.7 C) (01/30 0806) Pulse Rate:  [79-106] 79 (01/30 0806) Cardiac Rhythm: Atrial fibrillation (01/30 0700) Resp:  [15-20] 18 (01/30 0806) BP: (118-135)/(59-80) 135/80 (01/30 0806) SpO2:  [84 %-100 %] 98 % (01/30 0806) Weight:  [50.3 kg (110 lb 14.3 oz)] 50.3 kg (110 lb 14.3 oz) (01/30 0530)  Intake/Output Summary (Last 24 hours) at 05/25/2023 0852 Last data filed at 05/25/2023 0530 Gross per 24 hour  Intake --  Output 1050 ml  Net -1050 ml    Current Heart Failure Medications:  Loop diuretic: furosemide 40 mg IV once Beta-Blocker: bisoprolol 10 mg twice daily ACEI/ARB/ARNI: losartan 25 mg at bedtime MRA: none SGLT2i: none Other: digoxin 0.125 mg daily  Prior to admission Heart Failure Medications:  Loop diuretic: none Beta-Blocker: bisoprolol 5 mg twice daily ACEI/ARB/ARNI: none MRA: none SGLT2i: none Other: diltiazem 60 mg twice daily.  Assessment: 1. 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 NYHA class III symptoms.  -Symptoms: Reports shortness of breath and is improved. Has persistent orthopnea at baseline. LEE is back  to baseline per patient report. Appetite is fair, she just requires that her food be very hot. -Volume: Appears nearly euvolemic. No LEE. On furosemide 40 mg IV daily. Consider transition to oral diuretics tomorrow.   -Hemodynamics: BP is higher today. HR is improved to 80-100s.  -BB: Bisoprolol increased to 10 mg twice daily. -ACEI/ARB/ARNI: Continue losartan 25 mg daily.  -MRA: Potassium has improved and renal function is stable. Can consider adding spironolactone 12.5 mg daily. -SGLT2i: No history of UTI, however patient does occasionally get "itchiness". This could be yeast infection. Would benefit HF, DM, and CKD,  however would not add at this time. -Would acquire digoxin level 05/27/23. Will need to monitor closely given accumulation in the elderly.  -Will monitor rate control with atrial fibrillation.  Plan: 1) Recommendations: -Consider checking digoxin level on 05/27/23.   2) Patient assistance: -Angelica Ferguson requires Prior Authorization, Angelica Ferguson not on formulary, Jardiance copay is $40.00, Eliquis copay is $40.00, Xarelto copay is $40.00, Savaysa copay is $418.57   3) Education: -To be completed prior to discharge. Will wait until after cardiology has discussed echo results before providing heart failure education.  Medication Assistance / Insurance Benefits Check: Does the patient have prescription insurance?    Type of insurance plan:  Does the patient qualify for medication assistance through manufacturers or grants? Pending   Outpatient Pharmacy: Prior to admission outpatient pharmacy: [facility]     Please do not hesitate to reach out with questions or concerns,  Angelica Ferguson, PharmD, CPP, BCPS Heart Failure Pharmacist  Phone - [phone] 05/25/2023 8:52 AM

## 2023-05-25 NOTE — Progress Notes (Signed)
Progress Note   Patient: Angelica Ferguson UJW:[id number] DOB: 06/08/1932 DOA: 05/22/2023     3 DOS: the patient was seen and examined on 05/25/2023   Brief hospital course: Hospital course / significant events:   HPI: Tiffanni Scarfo is a very pleasant 89 y.o. female with medical history significant of PAF, COPD, CAD status post stenting, DM2, brought in by family members for worsening cough, wheezing, shortness of breath, started 1 week prior, PCP tx for COPD exac, given doxy rx, inhalers, tessalon. Noted edema bilateral ankles x2 days. Daughter also reported that last 2 days, a wearable telemonitoring device on patient's arm show " A-fib."  As compared to baseline patient has rate controlled A-fib usually heart rate in the 70-80 range.  01/28: Chest x-ray showed pulmonary congestion. CTA showed negative for PE but bilateral multifocal infiltrates considered to be CHF versus multifocal pneumonia. BNP 1760. Afib RVR HR 130-150s, IV Cardizem and started on a drip, and was given IV Lasix 60 mg x 1 in the ED. Admitted to hospitalist, d/c cardizem, ordered IV digoxin, ordered prn metoprolol  1/29.  Patient feeling little bit better.  Echocardiogram showed an EF of 25 to 30%.     Assessment and Plan: Acute on chronic systolic CHF (congestive heart failure) [facility]) Cardiology to increase bisoprolol to control heart rate.  Continue low-dose Cozaar.  Continue IV Lasix today.  EF of 25 to 30%.  Acute hypoxic respiratory failure (HCC) Pulse ox of 77% today on room air.  Continue to try to taper off oxygen.  Most likely will end up needing oxygen at home.  COPD with acute exacerbation (HCC) Continue Breo and Incruse inhalers and continue Solu-Medrol to 40 mg daily.  Atrial fibrillation with RVR (HCC) Patient on digoxin and cardiology to increase bisoprolol.  Anticoagulated with edoxaban.  Essential hypertension Continue bisoprolol and Cozaar.  Plan to add Aldactone prior to discharge if blood pressure  allows.  Iron deficiency anemia Hemoglobin 9.6 with a ferritin of 23.  Uncontrolled type 2 diabetes mellitus with hyperglycemia, with long-term current use of insulin (HCC) Hemoglobin A1c 6.5.  Will taper steroids.  Continue sliding scale.  Myocardial injury Demand ischemia from acute respiratory failure and heart failure exacerbation.       Subjective: Patient feeling little bit better today.  Cardiology to increase bisoprolol to twice daily dosing for better control of heart rate.  Patient did drop her saturations with the mobility specialist down to 77%.  Physical Exam: Vitals:   05/25/23 0353 05/25/23 0530 05/25/23 0806 05/25/23 1119  BP: 121/63  135/80 105/63  Pulse: 87  79 80  Resp: 18  18 18   Temp: (!) 97.4 F (36.3 C)  98 F (36.7 C) 97.7 F (36.5 C)  TempSrc: Oral   Oral  SpO2: 98%  98% 97%  Weight:  50.3 kg    Height:       Physical Exam HENT:     Head: Normocephalic.     Mouth/Throat:     Pharynx: No oropharyngeal exudate.  Eyes:     General: Lids are normal.     Conjunctiva/sclera: Conjunctivae normal.  Cardiovascular:     Rate and Rhythm: Tachycardia present. Rhythm irregularly irregular.     Heart sounds: Normal heart sounds, S1 normal and S2 normal.  Pulmonary:     Breath sounds: Examination of the right-lower field reveals decreased breath sounds. Examination of the left-lower field reveals decreased breath sounds. Decreased breath sounds present. No wheezing, rhonchi or rales.  Abdominal:  Palpations: Abdomen is soft.     Tenderness: There is no abdominal tenderness.  Musculoskeletal:     Right lower leg: No swelling.     Left lower leg: No swelling.  Skin:    General: Skin is warm.     Findings: No rash.  Neurological:     Mental Status: She is alert and oriented to person, place, and time.     Data Reviewed: Creatinine 0.81, hemoglobin 9.6  Family Communication: Spoke with daughter at the bedside  Disposition: Status is:  Inpatient Remains inpatient appropriate because: Cardiology to increase bisoprolol to 10 mg twice daily for better control of heart rate.  Patient did drop her saturations down to 77% on room air.  Planned Discharge Destination: Home with Home Health    Time spent: 28 minutes  Author: Alford Highland, MD 05/25/2023 3:07 PM  For on call review [email or web address].

## 2023-05-26 DIAGNOSIS — I5021 Acute systolic (congestive) heart failure: Secondary | ICD-10-CM

## 2023-05-26 DIAGNOSIS — K219 Gastro-esophageal reflux disease without esophagitis: Secondary | ICD-10-CM | POA: Insufficient documentation

## 2023-05-26 DIAGNOSIS — J441 Chronic obstructive pulmonary disease with (acute) exacerbation: Secondary | ICD-10-CM | POA: Diagnosis not present

## 2023-05-26 DIAGNOSIS — I5023 Acute on chronic systolic (congestive) heart failure: Secondary | ICD-10-CM | POA: Diagnosis not present

## 2023-05-26 DIAGNOSIS — I4891 Unspecified atrial fibrillation: Secondary | ICD-10-CM | POA: Diagnosis not present

## 2023-05-26 DIAGNOSIS — J9621 Acute and chronic respiratory failure with hypoxia: Secondary | ICD-10-CM

## 2023-05-26 LAB — BASIC METABOLIC PANEL
Anion gap: 9 (ref 5–15)
BUN: 58 mg/dL — ABNORMAL HIGH (ref 8–23)
CO2: 35 mmol/L — ABNORMAL HIGH (ref 22–32)
Calcium: 8.4 mg/dL — ABNORMAL LOW (ref 8.9–10.3)
Chloride: 94 mmol/L — ABNORMAL LOW (ref 98–111)
Creatinine, Ser: 0.91 mg/dL (ref 0.44–1.00)
GFR, Estimated: >60 mL/min (ref >60)
Glucose, Bld: 160 mg/dL — ABNORMAL HIGH (ref 70–99)
Potassium: 4 mmol/L (ref 3.5–5.1)
Sodium: 138 mmol/L (ref 135–145)

## 2023-05-26 LAB — GLUCOSE, CAPILLARY
Glucose-Capillary: 137 mg/dL — ABNORMAL HIGH (ref 70–99)
Glucose-Capillary: 201 mg/dL — ABNORMAL HIGH (ref 70–99)
Glucose-Capillary: 155 mg/dL — ABNORMAL HIGH (ref 70–99)

## 2023-05-26 LAB — CBC
HCT: 35.1 % — ABNORMAL LOW (ref 36.0–46.0)
Hemoglobin: 10.6 g/dL — ABNORMAL LOW (ref 12.0–15.0)
MCH: 22.3 pg — ABNORMAL LOW (ref 26.0–34.0)
MCHC: 30.2 g/dL (ref 30.0–36.0)
MCV: 73.7 fL — ABNORMAL LOW (ref 80.0–100.0)
Platelets: 391 10*3/uL (ref 150–400)
RBC: 4.76 MIL/uL (ref 3.87–5.11)
RDW: 17.9 % — ABNORMAL HIGH (ref 11.5–15.5)
WBC: 20.6 10*3/uL — ABNORMAL HIGH (ref 4.0–10.5)
nRBC: 0 % (ref 0.0–0.2)

## 2023-05-26 MED ORDER — PREDNISONE 10 MG PO TABS: ORAL_TABLET | ORAL | 0 refills | Status: DC

## 2023-05-26 MED ORDER — DIGOXIN 125 MCG PO TABS: 0.1250 mg | ORAL_TABLET | Freq: Every day | ORAL | 0 refills | Status: DC

## 2023-05-26 MED ORDER — BISOPROLOL FUMARATE 10 MG PO TABS: 10.0000 mg | ORAL_TABLET | Freq: Two times a day (BID) | ORAL | 0 refills | Status: DC

## 2023-05-26 MED ORDER — FUROSEMIDE 20 MG PO TABS: 20.0000 mg | ORAL_TABLET | Freq: Every day | ORAL | Status: DC

## 2023-05-26 MED ORDER — FUROSEMIDE 20 MG PO TABS: 20.0000 mg | ORAL_TABLET | Freq: Every day | ORAL | 0 refills | Status: DC

## 2023-05-26 MED ORDER — LOSARTAN POTASSIUM 25 MG PO TABS: 25.0000 mg | ORAL_TABLET | Freq: Every day | ORAL | 0 refills | Status: DC

## 2023-05-26 MED ORDER — PANTOPRAZOLE SODIUM 40 MG PO TBEC: 40.0000 mg | DELAYED_RELEASE_TABLET | Freq: Every day | ORAL | 0 refills | Status: DC

## 2023-05-26 NOTE — Plan of Care (Signed)

## 2023-05-26 NOTE — Assessment & Plan Note (Signed)
Start Protonix 

## 2023-05-26 NOTE — Discharge Summary (Signed)
Physician Discharge Summary   Patient: Angelica Ferguson MRN: [id number] DOB: 05/08/1932  Admit date:     05/22/2023  Discharge date: 05/26/23  Discharge Physician: Alford Highland   PCP: Jackolyn Confer, MD   Recommendations at discharge:   Follow-up PCP 5 days Follow-up CHF clinic  Discharge Diagnoses: Active Problems:   Acute on chronic systolic CHF (congestive heart failure) (HCC)   Acute on chronic hypoxic respiratory failure (HCC)   COPD with acute exacerbation (HCC)   Atrial fibrillation with RVR (HCC)   Essential hypertension   Iron deficiency anemia   Myocardial injury   Uncontrolled type 2 diabetes mellitus with hyperglycemia, with long-term current use of insulin (HCC)   Acute pulmonary edema (HCC)   Dilated cardiomyopathy (HCC)   GERD (gastroesophageal reflux disease)    Hospital Course: Hospital course / significant events:   HPI: Angelica Ferguson is a very pleasant 89 y.o. female with medical history significant of PAF, COPD, CAD status post stenting, DM2, brought in by family members for worsening cough, wheezing, shortness of breath, started 1 week prior, PCP tx for COPD exac, given doxy rx, inhalers, tessalon. Noted edema bilateral ankles x2 days. Daughter also reported that last 2 days, a wearable telemonitoring device on patient's arm show " A-fib."  As compared to baseline patient has rate controlled A-fib usually heart rate in the 70-80 range.  01/28: Chest x-ray showed pulmonary congestion. CTA showed negative for PE but bilateral multifocal infiltrates considered to be CHF versus multifocal pneumonia. BNP 1760. Afib RVR HR 130-150s, IV Cardizem and started on a drip, and was given IV Lasix 60 mg x 1 in the ED. Admitted to hospitalist, d/c cardizem, ordered IV digoxin, ordered prn metoprolol  1/29.  Patient feeling little bit better.  Echocardiogram showed an EF of 25 to 30%.  Patient on Lasix diuresis. 1/30.  Continue IV Lasix diuresis.  Increase bisoprolol to  10 mg twice daily 1/31.  Patient feeling better and wanting to go home.     Assessment and Plan: Acute on chronic systolic CHF (congestive heart failure) (HCC) Continue increased dose of bisoprolol 10 mg twice daily bisoprolol.  Continue low-dose Cozaar.  EF of 25 to 30%.  Patient given IV Lasix today and be switched over to oral Lasix for tomorrow.  As per cardiology he can consider Aldactone and/or Farxiga as outpatient.  Since she is frail and do not want to add too many medications at once.  Acute on chronic hypoxic respiratory failure (HCC) Pulse ox of 77% yesterday on room air.  Patient did better with ambulating on room air today.  Did have an episode that dipped briefly down in the 70s.  Home oxygen prescribed 2 L.  COPD with acute exacerbation (HCC) Can go back on Trelegy Ellipta as outpatient.  Patient was on Trelegy inhaler substitutes while here and IV Solu-Medrol.  Switch over to prednisone for 1 more day  Atrial fibrillation with RVR (HCC) Patient on digoxin and bisoprolol twice daily.  Anticoagulated with edoxaban.  Essential hypertension Continue bisoprolol and Cozaar.  Plan to add Aldactone as outpatient if blood pressure allows  Iron deficiency anemia Hemoglobin 10.6 with a ferritin of 23.  GERD (gastroesophageal reflux disease) Start Protonix  Uncontrolled type 2 diabetes mellitus with hyperglycemia, with long-term current use of insulin (HCC) Hemoglobin A1c 6.5.  Will hold Glucophage for now since sugars not that elevated even with steroids.  Myocardial injury Demand ischemia from acute respiratory failure and heart failure exacerbation.  Consultants: Cardiology Procedures performed: None Disposition: Home health Diet recommendation:  Cardiac and Carb modified diet DISCHARGE MEDICATION: Allergies as of 05/26/2023       Reactions   Sulfa Antibiotics    Blood pressure dropped        Medication List     STOP taking these medications     diltiazem 60 MG tablet Commonly known as: CARDIZEM   metFORMIN 500 MG tablet Commonly known as: GLUCOPHAGE       TAKE these medications    albuterol 108 (90 Base) MCG/ACT inhaler Commonly known as: VENTOLIN HFA Inhale 1 puff into the lungs every 6 (six) hours as needed for wheezing or shortness of breath.   atorvastatin 40 MG tablet Commonly known as: LIPITOR Take 1 tablet by mouth daily. What changed: Another medication with the same name was removed. Continue taking this medication, and follow the directions you see here.   benzonatate 200 MG capsule Commonly known as: TESSALON Take 200 mg by mouth 3 (three) times daily as needed. What changed: Another medication with the same name was removed. Continue taking this medication, and follow the directions you see here.   bisoprolol 10 MG tablet Commonly known as: ZEBETA Take 1 tablet (10 mg total) by mouth 2 (two) times daily. What changed:  medication strength how much to take   digoxin 0.125 MG tablet Commonly known as: LANOXIN Take 1 tablet (0.125 mg total) by mouth daily. Start taking on: May 27, 2023   furosemide 20 MG tablet Commonly known as: LASIX Take 1 tablet (20 mg total) by mouth daily. Start taking on: May 27, 2023   losartan 25 MG tablet Commonly known as: COZAAR Take 1 tablet (25 mg total) by mouth at bedtime.   pantoprazole 40 MG tablet Commonly known as: Protonix Take 1 tablet (40 mg total) by mouth daily.   predniSONE 10 MG tablet Commonly known as: DELTASONE 4 tabs po for one more day Start taking on: May 27, 2023   Savaysa 30 MG Tabs tablet Generic drug: edoxaban Take 1 tablet (30 mg total) by mouth daily.   Trelegy Ellipta 100-62.5-25 MCG/ACT Aepb Generic drug: Fluticasone-Umeclidin-Vilant Inhale 1 puff into the lungs daily.               Durable Medical Equipment  (From admission, onward)           Start     Ordered   05/26/23 1306  For home use only  DME oxygen  Once       Question Answer Comment  Length of Need Lifetime   Mode or (Route) Nasal cannula   Liters per Minute 2   Frequency Continuous (stationary and portable oxygen unit needed)   Oxygen conserving device Yes   Oxygen delivery system Gas      05/26/23 1305   05/26/23 1002  For home use only DME Walker rolling  Once       Question Answer Comment  Walker: With 5 Inch Wheels   Patient needs a walker to treat with the following condition Generalized weakness      05/26/23 1001            Follow-up Information     [facility]. Go on 06/02/2023.   Specialty: Cardiology Why: Hospital Follow-Up 06/02/23 @ 10:30 Please bring all medications to follow-up appointment [facility], [address]. Second Floor Free Valet Parking at the Advertising account planner information: [address] [facility]  [place] [address] [phone]        Jackolyn Confer, MD Follow up in 5 day(s).   Specialty: Family Medicine Contact information: [address] [place] [address] [phone]                Discharge Exam: Filed Weights   05/24/23 0500 05/25/23 0530 05/26/23 0456  Weight: 53 kg 50.3 kg 50.9 kg   Physical Exam HENT:     Head: Normocephalic.     Mouth/Throat:     Pharynx: No oropharyngeal exudate.  Eyes:     General: Lids are normal.     Conjunctiva/sclera: Conjunctivae normal.  Cardiovascular:     Rate and Rhythm: Normal rate. Rhythm irregularly irregular.     Heart sounds: Normal heart sounds, S1 normal and S2 normal.  Pulmonary:     Breath sounds: Examination of the right-lower field reveals decreased breath sounds. Examination of the left-lower field reveals decreased breath sounds. Decreased breath sounds present. No wheezing, rhonchi or rales.  Abdominal:     Palpations: Abdomen is soft.     Tenderness: There is no abdominal tenderness.  Musculoskeletal:     Right lower leg: No swelling.     Left  lower leg: No swelling.  Skin:    General: Skin is warm.     Findings: No rash.  Neurological:     Mental Status: She is alert and oriented to person, place, and time.      Condition at discharge: stable  The results of significant diagnostics from this hospitalization (including imaging, microbiology, ancillary and laboratory) are listed below for reference.   Imaging Studies: ECHOCARDIOGRAM COMPLETE Result Date: 05/23/2023    ECHOCARDIOGRAM REPORT   Patient Name:   WANNA GULLY Date of Exam: 05/23/2023 Medical Rec #:  [id number]       Height:       65.0 in Accession #:    [id number]      Weight:       114.6 lb Date of Birth:  14-Mar-1933      BSA:          1.561 m Patient Age:    89 years        BP:           128/73 mmHg Patient Gender: F               HR:           124 bpm. Exam Location:  [facility] Procedure: 2D Echo, Cardiac Doppler and Color Doppler Indications:     CHF  History:         Patient has no prior history of Echocardiogram examinations.                  CHF, COPD, Arrythmias:Atrial Fibrillation; Risk                  Factors:Hypertension.  Sonographer:     Mikki Harbor Referring Phys:  [id number] Emeline General Diagnosing Phys: Yvonne Kendall MD  Sonographer Comments: Technically difficult study due to poor echo windows and no apical window. Image acquisition challenging due to COPD and Image acquisition challenging due to respiratory motion. IMPRESSIONS  1. Left ventricular ejection fraction, by estimation, is 25 to 30%. The left ventricle has severely decreased function. Left ventricular endocardial border not optimally defined to evaluate regional wall motion. Left ventricular diastolic function could  not be evaluated.  2. Right ventricular systolic function is normal. The right ventricular size  is normal. There is normal pulmonary artery systolic pressure.  3. Left atrial size was mildly dilated.  4. The mitral valve is degenerative. Mild to moderate mitral valve regurgitation.  5.  The aortic valve is tricuspid. There is mild calcification of the aortic valve. There is mild thickening of the aortic valve. Aortic valve regurgitation not well assessed. Aortic valve gradient not well assessed. FINDINGS  Left Ventricle: Left ventricular ejection fraction, by estimation, is 25 to 30%. The left ventricle has severely decreased function. Left ventricular endocardial border not optimally defined to evaluate regional wall motion. The left ventricular internal cavity size was normal in size. There is no left ventricular hypertrophy. Left ventricular diastolic function could not be evaluated. Right Ventricle: The right ventricular size is normal. No increase in right ventricular wall thickness. Right ventricular systolic function is normal. There is normal pulmonary artery systolic pressure. The tricuspid regurgitant velocity is 2.06 m/s, and  with an assumed right atrial pressure of 8 mmHg, the estimated right ventricular systolic pressure is 25.0 mmHg. Left Atrium: Left atrial size was mildly dilated. Right Atrium: Right atrial size was normal in size. Pericardium: There is no evidence of pericardial effusion. Mitral Valve: The mitral valve is degenerative in appearance. There is mild thickening of the mitral valve leaflet(s). Mild to moderate mitral valve regurgitation. MV peak gradient, 6.1 mmHg. The mean mitral valve gradient is 2.0 mmHg. Tricuspid Valve: The tricuspid valve is normal in structure. Tricuspid valve regurgitation is trivial. Aortic Valve: The aortic valve is tricuspid. There is mild calcification of the aortic valve. There is mild thickening of the aortic valve. Aortic valve regurgitation not well assessed. Aortic valve gradient not well assessed. Pulmonic Valve: The pulmonic valve was not well visualized. Pulmonic valve regurgitation is not visualized. No evidence of pulmonic stenosis. Aorta: The aortic root is normal in size and structure. Pulmonary Artery: The pulmonary artery is  not well seen. IAS/Shunts: The interatrial septum was not well visualized.  LEFT VENTRICLE PLAX 2D LVIDd:         4.10 cm LVIDs:         3.50 cm LV PW:         0.90 cm LV IVS:        1.00 cm LVOT diam:     1.90 cm LV SV:         38 LV SV Index:   24 LVOT Area:     2.84 cm  LEFT ATRIUM         Index LA diam:    3.20 cm 2.05 cm/m  AORTIC VALVE             PULMONIC VALVE LVOT Vmax:   71.10 cm/s  PV Vmax:       0.60 m/s LVOT Vmean:  48.300 cm/s PV Peak grad:  1.4 mmHg LVOT VTI:    0.133 m  AORTA Ao Root diam: 2.60 cm MITRAL VALVE               TRICUSPID VALVE MV Area (PHT): 5.50 cm    TR Peak grad:   17.0 mmHg MV Area VTI:   1.33 cm    TR Vmax:        206.00 cm/s MV Peak grad:  6.1 mmHg MV Mean grad:  2.0 mmHg    SHUNTS MV Vmax:       1.23 m/s    Systemic VTI:  0.13 m MV Vmean:      61.0 cm/s   Systemic  Diam: 1.90 cm MV Decel Time: 138 msec MV E velocity: 68.60 cm/s Yvonne Kendall MD Electronically signed by Yvonne Kendall MD Signature Date/Time: 05/23/2023/6:11:31 PM    Final    DG Chest 1 View Result Date: 05/23/2023 CLINICAL DATA:  89 year old female with history of congestive heart failure. Shortness of breath. EXAM: CHEST  1 VIEW COMPARISON:  Chest x-ray 05/22/2023. FINDINGS: Lung volumes are normal. Widespread areas of interstitial prominence and peribronchial cuffing, similar to prior studies, likely reflective of chronic bronchitis. Opacity at the right base which may reflect atelectasis and/or consolidation, with superimposed small right pleural effusion. No definite left pleural effusion. No pneumothorax. No evidence of pulmonary edema. Small calcified granulomas are noted in the left upper lobe near the apex. Heart size is normal. The patient is rotated to the right on today's exam, resulting in distortion of the mediastinal contours and reduced diagnostic sensitivity and specificity for mediastinal pathology. IMPRESSION: 1. Atelectasis and/or consolidation in the right lung base with small right  pleural effusion. 2. The appearance of the lungs suggests probable chronic bronchitis. 3. Aortic atherosclerosis. Electronically Signed   By: Trudie Reed M.D.   On: 05/23/2023 07:17   CT Angio Chest PE W and/or Wo Contrast Result Date: 05/22/2023 CLINICAL DATA:  Shortness of breath EXAM: CT ANGIOGRAPHY CHEST WITH CONTRAST TECHNIQUE: Multidetector CT imaging of the chest was performed using the standard protocol during bolus administration of intravenous contrast. Multiplanar CT image reconstructions and MIPs were obtained to evaluate the vascular anatomy. RADIATION DOSE REDUCTION: This exam was performed according to the departmental dose-optimization program which includes automated exposure control, adjustment of the mA and/or kV according to patient size and/or use of iterative reconstruction technique. CONTRAST:  75mL OMNIPAQUE IOHEXOL 350 MG/ML SOLN COMPARISON:  Same-day x-ray FINDINGS: Cardiovascular: Satisfactory opacification of the pulmonary arteries to the segmental level. No evidence of pulmonary embolism. Thoracic aorta is nonaneurysmal. Atherosclerotic calcifications of the aorta and coronary arteries. Normal heart size. No pericardial effusion. Mediastinum/Nodes: Mildly enlarged mediastinal lymph nodes including 11 mm lower right paratracheal node and 13 mm subcarinal node (series 4, images 64 and 72). Mildly enlarged right hilar lymph node measuring 11 mm (series 4, image 70). No axillary or left hilar lymphadenopathy. Tiny hiatal hernia. Esophagus otherwise unremarkable. Lungs/Pleura: Small layering bilateral pleural effusions. Prominent biapical pleuroparenchymal scarring. Ground-glass opacity bilaterally most pronounced within the perihilar regions and upper lobes. Diffuse bronchial wall thickening. Multiple scattered pulmonary nodules including 8 mm nodule within the perihilar aspect of the left lower lobe (series 6, image 90). 5 mm right lower lobe nodule (series 6, image 99). Multiple  calcified granulomas in the left upper lobe. No pneumothorax. Upper Abdomen: No acute abnormality. Musculoskeletal: Subacute or chronic appearing mild inferior endplate compression deformity of L1. Osseous structures are otherwise intact. No chest wall abnormality. Review of the MIP images confirms the above findings. IMPRESSION: 1. No evidence of pulmonary embolism. 2. Small layering bilateral pleural effusions. 3. Ground-glass opacity bilaterally most pronounced within the perihilar regions and upper lobes, which may represent pulmonary edema or atypical infection. 4. Multiple scattered pulmonary nodules measuring up to 8 mm in size. Non-contrast chest CT at 3-6 months is recommended. If the nodules are stable at time of repeat CT, then future CT at 18-24 months (from today's scan) is considered optional for low-risk patients, but is recommended for high-risk patients. This recommendation follows the consensus statement: Guidelines for Management of Incidental Pulmonary Nodules Detected on CT Images: From the Fleischner Society 2017; Radiology 2017;  161:096-045. 5. Mildly enlarged mediastinal and right hilar lymph nodes, which may be reactive. 6. Subacute or chronic appearing mild inferior endplate compression deformity of L1. Correlate with point tenderness. 7. Aortic and coronary artery atherosclerosis (ICD10-I70.0). Electronically Signed   By: Duanne Guess D.O.   On: 05/22/2023 12:49   DG Chest Portable 1 View Result Date: 05/22/2023 CLINICAL DATA:  Shortness of breath EXAM: PORTABLE CHEST 1 VIEW COMPARISON:  11/21/2020 FINDINGS: Normal heart size. Aortic atherosclerosis. Mildly hyperinflated lungs. Persistently coarsened interstitial markings, most pronounced within the upper lobes. New small right pleural effusion. No pneumothorax. IMPRESSION: 1. New small right pleural effusion. 2. Persistently coarsened interstitial markings, most pronounced within the upper lobes. Electronically Signed   By:  Duanne Guess D.O.   On: 05/22/2023 09:47    Microbiology: Results for orders placed or performed during the hospital encounter of 05/22/23  Resp panel by RT-PCR (RSV, Flu A&B, Covid) Anterior Nasal Swab     Status: None   Collection Time: 05/22/23 10:04 AM   Specimen: Anterior Nasal Swab  Result Value Ref Range Status   SARS Coronavirus 2 by RT PCR NEGATIVE NEGATIVE Final    Comment: (NOTE) SARS-CoV-2 target nucleic acids are NOT DETECTED.  The SARS-CoV-2 RNA is generally detectable in upper respiratory specimens during the acute phase of infection. The lowest concentration of SARS-CoV-2 viral copies this assay can detect is 138 copies/mL. A negative result does not preclude SARS-Cov-2 infection and should not be used as the sole basis for treatment or other patient management decisions. A negative result may occur with  improper specimen collection/handling, submission of specimen other than nasopharyngeal swab, presence of viral mutation(s) within the areas targeted by this assay, and inadequate number of viral copies(<138 copies/mL). A negative result must be combined with clinical observations, patient history, and epidemiological information. The expected result is Negative.  Fact Sheet for Patients:  BloggerCourse.com  Fact Sheet for Healthcare Providers:  SeriousBroker.it  This test is no t yet approved or cleared by the [place] FDA and  has been authorized for detection and/or diagnosis of SARS-CoV-2 by FDA under an Emergency Use Authorization (EUA). This EUA will remain  in effect (meaning this test can be used) for the duration of the COVID-19 declaration under Section 564(b)(1) of the Act, 21 U.S.C.section 360bbb-3(b)(1), unless the authorization is terminated  or revoked sooner.       Influenza A by PCR NEGATIVE NEGATIVE Final   Influenza B by PCR NEGATIVE NEGATIVE Final    Comment: (NOTE) The Xpert  Xpress SARS-CoV-2/FLU/RSV plus assay is intended as an aid in the diagnosis of influenza from Nasopharyngeal swab specimens and should not be used as a sole basis for treatment. Nasal washings and aspirates are unacceptable for Xpert Xpress SARS-CoV-2/FLU/RSV testing.  Fact Sheet for Patients: BloggerCourse.com  Fact Sheet for Healthcare Providers: SeriousBroker.it  This test is not yet approved or cleared by the [place] FDA and has been authorized for detection and/or diagnosis of SARS-CoV-2 by FDA under an Emergency Use Authorization (EUA). This EUA will remain in effect (meaning this test can be used) for the duration of the COVID-19 declaration under Section 564(b)(1) of the Act, 21 U.S.C. section 360bbb-3(b)(1), unless the authorization is terminated or revoked.     Resp Syncytial Virus by PCR NEGATIVE NEGATIVE Final    Comment: (NOTE) Fact Sheet for Patients: BloggerCourse.com  Fact Sheet for Healthcare Providers: SeriousBroker.it  This test is not yet approved or cleared by the [place] FDA  and has been authorized for detection and/or diagnosis of SARS-CoV-2 by FDA under an Emergency Use Authorization (EUA). This EUA will remain in effect (meaning this test can be used) for the duration of the COVID-19 declaration under Section 564(b)(1) of the Act, 21 U.S.C. section 360bbb-3(b)(1), unless the authorization is terminated or revoked.  Performed at [facility], [address] [place], [place] [address]   Blood culture (routine x 2)     Status: None (Preliminary result)   Collection Time: 05/22/23 11:15 AM   Specimen: BLOOD  Result Value Ref Range Status   Specimen Description BLOOD BLOOD RIGHT FOREARM  Final   Special Requests   Final    BOTTLES DRAWN AEROBIC AND ANAEROBIC Blood Culture results may not be optimal due to an inadequate volume of  blood received in culture bottles   Culture   Final    NO GROWTH 4 DAYS Performed at [facility], [address] [place], [place] [address]    Report Status PENDING  Incomplete  Blood culture (routine x 2)     Status: None (Preliminary result)   Collection Time: 05/22/23 11:26 AM   Specimen: BLOOD  Result Value Ref Range Status   Specimen Description BLOOD BLOOD LEFT WRIST  Final   Special Requests   Final    BOTTLES DRAWN AEROBIC AND ANAEROBIC Blood Culture results may not be optimal due to an inadequate volume of blood received in culture bottles   Culture   Final    NO GROWTH 4 DAYS Performed at [facility], [address] [place], [place] [address]    Report Status PENDING  Incomplete    Labs: CBC: Recent Labs  Lab 05/22/23 1004 05/24/23 0356 05/26/23 0501  WBC 23.0* 20.0* 20.6*  NEUTROABS 20.4*  --   --   HGB 9.8* 9.6* 10.6*  HCT 34.0* 31.8* 35.1*  MCV 78.0* 74.8* 73.7*  PLT 518* 399 391   Basic Metabolic Panel: Recent Labs  Lab 05/22/23 1330 05/22/23 2142 05/23/23 0615 05/24/23 0356 05/25/23 0527 05/26/23 0501  NA  --  135 138 139 137 138  K  --  4.7 5.0 4.8 3.9 4.0  CL  --  98 104 97* 93* 94*  CO2  --  21* 27 31 33* 35*  GLUCOSE  --  194* 149* 163* 142* 160*  BUN  --  49* 50* 59* 59* 58*  CREATININE  --  1.17* 1.05* 0.96 0.81 0.91  CALCIUM  --  8.7* 8.0* 8.8* 8.6* 8.4*  MG 3.1*  --   --   --   --   --   PHOS 4.8*  --   --   --   --   --    Liver Function Tests: No results for input(s): "AST", "ALT", "ALKPHOS", "BILITOT", "PROT", "ALBUMIN" in the last 168 hours. CBG: Recent Labs  Lab 05/25/23 1548 05/25/23 2108 05/26/23 0758 05/26/23 1232 05/26/23 1623  GLUCAP 203* 179* 137* 201* 155*    Discharge time spent: greater than 30 minutes.  Signed: Alford Highland, MD [facility] 05/26/2023

## 2023-05-26 NOTE — TOC Progression Note (Addendum)
Transition of Care [facility]) - Progression Note    Patient Details  Name: Angelica Ferguson MRN: [id number] Date of Birth: [birth year]  Transition of Care [facility]) CM/SW Contact  Truddie Hidden, RN Phone Number: 05/26/2023, 9:17 AM  Clinical Narrative:    Spoke with patient's Daughter, [name] regarding discharge plan and therapy's recommendation for HHPT/OT. Family is agreeable to [facility] and does not have a preference of agency. Patient advised the accepting agency will contact her directly to scheduled SOC within 48 post discharge. [name] was informed patient's rolling walker will be delivered to her room as well as portable oxygen if she qualifies.[name] stated patient had oxygen previously via [employer]. She would prefer all DME via [employer].   Request for RW  sent to Jon from [employer].  Referral for [facility] sent and accepted by Velna Hatchet from [facility].   12:11pm RNCM spoke with Mitch from [employer] to request oxygen. Patient currently does not have qualifying saturations required for payor authorization. Mitch advised per cardiology patient will still likely need oxygen at home. Marthann Schiller is checking with his administration but states patient would like have to private pay. Attending and cardiologist notified.   1:07pm Request for oxygen processing.  Velna Hatchet from [place] notified of discharge .   TOC signing off.          Expected Discharge Plan and Services                                               Social Determinants of Health (SDOH) Interventions SDOH Screenings   Food Insecurity: No Food Insecurity (05/23/2023)  Housing: Low Risk  (05/25/2023)  Transportation Needs: No Transportation Needs (05/25/2023)  Utilities: Not At Risk (05/23/2023)  Depression (PHQ2-9): Low Risk  (05/05/2023)  Financial Resource Strain: Low Risk  (05/25/2023)  Social Connections: Moderately Isolated (05/23/2023)  Tobacco Use: Medium Risk (05/25/2023)    Readmission Risk Interventions     No data to display

## 2023-05-26 NOTE — Progress Notes (Signed)
Rounding Note    Patient Name: Angelica Ferguson Date of Encounter: 05/26/2023  [facility] Cardiologist: new Mariah Milling) Subjective   She feels better overall with less shortness of breath.  Lower extremity edema resolved.  She is close to baseline.  Inpatient Medications    Scheduled Meds:  atorvastatin  40 mg Oral Daily   benzonatate  200 mg Oral TID   bisoprolol  10 mg Oral BID   digoxin  0.125 mg Oral Daily   edoxaban  30 mg Oral Daily   fluticasone furoate-vilanterol  1 puff Inhalation Daily   And   umeclidinium bromide  1 puff Inhalation Daily   [START ON 05/27/2023] furosemide  20 mg Oral Daily   insulin aspart  0-15 Units Subcutaneous TID WC   insulin aspart  0-5 Units Subcutaneous QHS   losartan  25 mg Oral QHS   methylPREDNISolone (SOLU-MEDROL) injection  40 mg Intravenous Q24H   sodium chloride flush  3 mL Intravenous Q12H   Continuous Infusions:  PRN Meds: acetaminophen, guaiFENesin-dextromethorphan, levalbuterol, metoprolol tartrate, ondansetron (ZOFRAN) IV, sodium chloride, sodium chloride flush   Vital Signs    Vitals:   05/25/23 2342 05/26/23 0451 05/26/23 0456 05/26/23 0758  BP: 109/79 118/84  (!) 119/59  Pulse:  96  88  Resp: 19   16  Temp: (!) 97.5 F (36.4 C) (!) 97.3 F (36.3 C)  (!) 97.5 F (36.4 C)  TempSrc: Oral Oral    SpO2: 99% 100%  98%  Weight:   50.9 kg   Height:        Intake/Output Summary (Last 24 hours) at 05/26/2023 1158 Last data filed at 05/26/2023 1100 Gross per 24 hour  Intake 520 ml  Output 350 ml  Net 170 ml      05/26/2023    4:56 AM 05/25/2023    5:30 AM 05/24/2023    5:00 AM  Last 3 Weights  Weight (lbs) 112 lb 3.4 oz 110 lb 14.3 oz 116 lb 13.5 oz  Weight (kg) 50.9 kg 50.3 kg 53 kg      Telemetry    Atrial fibrillation rate 90-100 bpm - Personally Reviewed  Physical Exam   GEN: No acute distress.   Neck: No JVD Cardiac: IRIR, no murmurs, rubs, or gallops.  Respiratory: Decreased breath sounds  and crackles noted bilaterally GI: Soft, nontender, non-distended  MS: No edema; No deformity. Neuro:  Nonfocal  Psych: Normal affect   Labs    High Sensitivity Troponin:   Recent Labs  Lab 05/22/23 0900 05/22/23 1115  TROPONINIHS 143* 143*     Chemistry Recent Labs  Lab 05/22/23 1330 05/22/23 2142 05/24/23 0356 05/25/23 0527 05/26/23 0501  NA  --    < > 139 137 138  K  --    < > 4.8 3.9 4.0  CL  --    < > 97* 93* 94*  CO2  --    < > 31 33* 35*  GLUCOSE  --    < > 163* 142* 160*  BUN  --    < > 59* 59* 58*  CREATININE  --    < > 0.96 0.81 0.91  CALCIUM  --    < > 8.8* 8.6* 8.4*  MG 3.1*  --   --   --   --   GFRNONAA  --    < > 57* >60 >60  ANIONGAP  --    < > 11 11 9    < > =  values in this interval not displayed.    Lipids No results for input(s): "CHOL", "TRIG", "HDL", "LABVLDL", "LDLCALC", "CHOLHDL" in the last 168 hours.  Hematology Recent Labs  Lab 05/22/23 1004 05/22/23 2142 05/24/23 0356 05/26/23 0501  WBC 23.0*  --  20.0* 20.6*  RBC 4.36 4.59 4.25 4.76  HGB 9.8*  --  9.6* 10.6*  HCT 34.0*  --  31.8* 35.1*  MCV 78.0*  --  74.8* 73.7*  MCH 22.5*  --  22.6* 22.3*  MCHC 28.8*  --  30.2 30.2  RDW 18.4*  --  18.2* 17.9*  PLT 518*  --  399 391   Thyroid  Recent Labs  Lab 05/22/23 1115  TSH 0.887    BNP Recent Labs  Lab 05/22/23 1004  BNP 1,759.2*    DDimer No results for input(s): "DDIMER" in the last 168 hours.   Radiology    ECHOCARDIOGRAM COMPLETE Result Date: 05/23/2023 IMPRESSIONS  1. Left ventricular ejection fraction, by estimation, is 25 to 30%. The left ventricle has severely decreased function. Left ventricular endocardial border not optimally defined to evaluate regional wall motion. Left ventricular diastolic function could  not be evaluated.  2. Right ventricular systolic function is normal. The right ventricular size is normal. There is normal pulmonary artery systolic pressure.  3. Left atrial size was mildly dilated.  4. The  mitral valve is degenerative. Mild to moderate mitral valve regurgitation.  5. The aortic valve is tricuspid. There is mild calcification of the aortic valve. There is mild thickening of the aortic valve. Aortic valve regurgitation not well assessed. Aortic valve gradient not well assessed. Electronically signed by Yvonne Kendall MD Signature Date/Time: 05/23/2023/6:11:31 PM    Final     Cardiac Studies   05/23/2023 TTE  1. Left ventricular ejection fraction, by estimation, is 25 to 30%. The  left ventricle has severely decreased function. Left ventricular  endocardial border not optimally defined to evaluate regional wall motion.  Left ventricular diastolic function could   not be evaluated.   2. Right ventricular systolic function is normal. The right ventricular  size is normal. There is normal pulmonary artery systolic pressure.   3. Left atrial size was mildly dilated.   4. The mitral valve is degenerative. Mild to moderate mitral valve  regurgitation.   5. The aortic valve is tricuspid. There is mild calcification of the  aortic valve. There is mild thickening of the aortic valve. Aortic valve  regurgitation not well assessed. Aortic valve gradient not well assessed.   04/2020 TTE [facility], official results not available) Normal EF with grade 3 diastolic dysfunction and pulmonary hypertension  Patient Profile     Angelica Ferguson is a 89 y.o. female with a hx of paroxysmal atrial fibrillation, COPD, CAD s/p stenting 2012, and T2DM who is being seen for the continued evaluation of CHF and atrial fibrillation   Assessment & Plan    Acute CHF decompensation - Echo 04/2020 with normal LV function and G3DD - Echo this admission shows LVEF 25-30% -She improved with IV diuresis and she appears to be euvolemic.  I switch her to furosemide 20 mg by mouth once daily. - Continue bisoprolol 10 mg daily and losartan 25 mg daily -Will consider adding spironolactone in the outpatient  setting.  Atrial fibrillation with RVR - Longstanding history of permanent atrial fibrillation per patient and daughter, typically rate controlled - CHA2DS2VASc 7 -Diltiazem was discontinued due to cardiomyopathy.  She was instead started on small dose digoxin.  Bisoprolol can be continued.  Continue PTA edoxaban 30 mg daily -Will have to obtain digoxin level in few days.  Acute hypoxic respiratory failure  Acute COPD exacerbation on chronic COPD - Patient presented 1/27 with worsening shortness of breath for the past 1-2 weeks - Previously on BiPAP, now weaned to nasal cannula - Management per IM  CAD Elevated troponin - Troponin peaked at 143 - No ischemic changes on EKG - Suspect demand ischemia in the setting of acute hypoxic respiratory failure and atrial fibrillation with RVR -Considering her age and comorbidities, the patient and daughter elected for conservative therapy and no invasive procedures.  T2DM - Management per IM  For questions or updates, please contact [facility] Please consult [email or web address] for contact info under        Signed, Lorine Bears, MD  05/26/2023, 11:58 AM

## 2023-05-26 NOTE — Progress Notes (Signed)
Heart Failure Stewardship Pharmacy Note  PCP: Jackolyn Confer, MD PCP-Cardiologist: None  HPI: Angelica Ferguson is a 89 y.o. female with PAF, COPD, CAD status post stenting in 2012, type 2 diabetes who presented with worsening cough and shortness of breath over ~1-2 weeks. Recently seen by PCP prior to presentation who prescribed steroids and doxycycline for a COPD exacerbation. 2 days prior to presentation, [name] noticed ankle edema bilaterally. HS-troponin was 143. BNP on admission was 1759.2. Lactic acid on admission was 2.1 trending down to 1.6. Iron labs showed low TSAT and Ferritin, meeting criteria for IV iron. Chest XR on admission showed small right pleural effusion. CT concerning for infection vs edema. Echocardiogram this admission showed LVEF of 25-30% with mild-moderate MR.  Pertinent Lab Values: Creatinine, Ser  Date Value Ref Range Status  05/26/2023 0.91 0.44 - 1.00 mg/dL Final   BUN  Date Value Ref Range Status  05/26/2023 58 (H) 8 - 23 mg/dL Final   Potassium  Date Value Ref Range Status  05/26/2023 4.0 3.5 - 5.1 mmol/L Final   Sodium  Date Value Ref Range Status  05/26/2023 138 135 - 145 mmol/L Final   B Natriuretic Peptide  Date Value Ref Range Status  05/22/2023 1,759.2 (H) 0.0 - 100.0 pg/mL Final    Comment:    Performed at [facility], [address] [place], [place] [address]   Magnesium  Date Value Ref Range Status  05/22/2023 3.1 (H) 1.7 - 2.4 mg/dL Final    Comment:    Performed at [facility], [address] [place], [place] [address]   Hgb A1c MFr Bld  Date Value Ref Range Status  05/22/2023 6.5 (H) 4.8 - 5.6 % Final    Comment:    (NOTE) Pre diabetes:          5.7%-6.4%  Diabetes:              >6.4%  Glycemic control for   <7.0% adults with diabetes    TSH  Date Value Ref Range Status  05/22/2023 0.887 0.350 - 4.500 uIU/mL Final    Comment:    Performed by a 3rd Generation assay with a functional  sensitivity of <=0.01 uIU/mL. Performed at [facility], [address] [place], [place] [address]     Vital Signs: Temp:  [97.3 F (36.3 C)-98.3 F (36.8 C)] 97.3 F (36.3 C) (01/31 0451) Pulse Rate:  [79-96] 96 (01/31 0451) Cardiac Rhythm: Atrial fibrillation (01/30 1904) Resp:  [16-19] 19 (01/30 2342) BP: (105-135)/(42-84) 118/84 (01/31 0451) SpO2:  [96 %-100 %] 100 % (01/31 0451) Weight:  [50.9 kg (112 lb 3.4 oz)] 50.9 kg (112 lb 3.4 oz) (01/31 0456)  Intake/Output Summary (Last 24 hours) at 05/26/2023 0751 Last data filed at 05/26/2023 0452 Gross per 24 hour  Intake 400 ml  Output 350 ml  Net 50 ml    Current Heart Failure Medications:  Loop diuretic: furosemide 40 mg IV once Beta-Blocker: bisoprolol 10 mg twice daily ACEI/ARB/ARNI: losartan 25 mg at bedtime MRA: none SGLT2i: none Other: digoxin 0.125 mg daily  Prior to admission Heart Failure Medications:  Loop diuretic: none Beta-Blocker: bisoprolol 5 mg twice daily ACEI/ARB/ARNI: none MRA: none SGLT2i: none Other: diltiazem 60 mg twice daily.  Assessment: 1. 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 NYHA class III symptoms.  -Symptoms: Reports shortness of breath and is improved. Reports only issue now is nasal congestion.  LEE is resolved.  -Volume: Appears euvolemic. No LEE. On furosemide 40 mg IV daily. Consider transition to oral diuretics. Chloride and CO2 are up today.  -Hemodynamics: BP is stable today. HR is improved to 80-100.  -BB: Bisoprolol 10 mg twice daily. -ACEI/ARB/ARNI: Continue losartan 25 mg daily.  -MRA: Potassium has improved and renal function is stable. Can consider adding spironolactone 12.5 mg daily. -SGLT2i: No history of UTI, however patient does occasionally get "itchiness". This could be yeast infection. Would benefit HF, DM, and CKD, however would not add at this time. -Would acquire digoxin level 05/27/23. Will need  to monitor closely given accumulation in the elderly.  -Will monitor rate control with atrial fibrillation.  Plan: 1) Recommendations: -Consider checking digoxin level tomorrow -Consider adding spironolactone 12.5 mg daily -Consider transitioning to furosemide 20 mg daily  2) Patient assistance: -Chauncey Mann requires Prior Authorization, [name] not on formulary, Jardiance copay is $40.00, Eliquis copay is $40.00, Xarelto copay is $40.00, Savaysa copay is $418.57   3) Education: -To be completed prior to discharge. Will wait until after cardiology has discussed echo results before providing heart failure education.  Medication Assistance / Insurance Benefits Check: Does the patient have prescription insurance?    Type of insurance plan:  Does the patient qualify for medication assistance through manufacturers or grants? Pending   Outpatient Pharmacy: Prior to admission outpatient [facility]     Please do not hesitate to reach out with questions or concerns,  Enos Fling, PharmD, CPP, BCPS Heart Failure Pharmacist  Phone - [phone] 05/26/2023 7:51 AM

## 2023-05-27 LAB — CULTURE, BLOOD (ROUTINE X 2)
Culture: NO GROWTH
Culture: NO GROWTH

## 2023-05-31 ENCOUNTER — Telehealth: Payer: Self-pay | Admitting: Pediatrics

## 2023-05-31 ENCOUNTER — Ambulatory Visit: Payer: 59 | Admitting: Pediatrics

## 2023-05-31 ENCOUNTER — Encounter: Payer: Self-pay | Admitting: Pediatrics

## 2023-05-31 VITALS — BP 99/57 | HR 80 | Temp 98.9°F | Resp 15 | Wt 102.3 lb

## 2023-05-31 DIAGNOSIS — E119 Type 2 diabetes mellitus without complications: Secondary | ICD-10-CM | POA: Diagnosis not present

## 2023-05-31 DIAGNOSIS — J449 Chronic obstructive pulmonary disease, unspecified: Secondary | ICD-10-CM

## 2023-05-31 DIAGNOSIS — I4891 Unspecified atrial fibrillation: Secondary | ICD-10-CM | POA: Diagnosis not present

## 2023-05-31 DIAGNOSIS — I5022 Chronic systolic (congestive) heart failure: Secondary | ICD-10-CM | POA: Diagnosis not present

## 2023-05-31 DIAGNOSIS — Z681 Body mass index (BMI) 19 or less, adult: Secondary | ICD-10-CM | POA: Insufficient documentation

## 2023-05-31 DIAGNOSIS — I5023 Acute on chronic systolic (congestive) heart failure: Secondary | ICD-10-CM

## 2023-05-31 DIAGNOSIS — Z09 Encounter for follow-up examination after completed treatment for conditions other than malignant neoplasm: Secondary | ICD-10-CM

## 2023-05-31 DIAGNOSIS — G4700 Insomnia, unspecified: Secondary | ICD-10-CM

## 2023-05-31 MED ORDER — TRAZODONE HCL 50 MG PO TABS: 25.0000 mg | ORAL_TABLET | Freq: Every evening | ORAL | 3 refills | Status: DC | PRN

## 2023-05-31 NOTE — Patient Instructions (Addendum)
 For sleep: Can use 25-50mg  of trazadone as needed. Try half first and see how that goes.  Incentive spirometry  Please keep checking blood pressures at home, would recommend taking 1/2 of losartan   We will check in 4 weeks. Please message if any issues come up!

## 2023-05-31 NOTE — Progress Notes (Signed)
 Office Visit  BP (!) 99/57 (BP Location: Left Arm, Patient Position: Sitting, Cuff Size: Small)   Pulse 80   Temp 98.9 F (37.2 C) (Oral)   Resp 15   Wt 102 lb 4.8 oz (46.4 kg)   SpO2 (!) 89%   BMI 17.02 kg/m    Subjective:    Patient ID: Angelica Ferguson, female    DOB: 16-Mar-1933, 89 y.o.   MRN: [id number]  HPI: Angelica Ferguson is a 89 y.o. female  Chief Complaint  Patient presents with   Hospitalization Follow-up    Fasting glucose last few am's 126, 133, 100 and 102.    Discussed the use of AI scribe software for clinical note transcription with the patient, who gave verbal consent to proceed.  History of Present Illness   Angelica Ferguson is an 89 year old female with hypertension and diabetes who presents for medication management and follow-up after a recent hospital discharge. She is accompanied by her caregiver, who is also her daughter.  Her caregiver notes that her heart rate has been stable, ranging from 73 to 81 bpm, but her blood pressure has been on the lower end, with a recent reading of 109/58 mmHg. She is currently on bisoprolol  10 mg twice a day and losartan , which are given separately to manage her blood pressure.  In terms of diabetes management, she has been on metformin  for a long time, but it was not included in her recent hospital discharge medications. Her caregiver reports fasting blood glucose levels ranging from 100 to 133 mg/dL. She has never been on insulin  before.  She experiences anxiety and poor sleep, particularly at night, which has been an issue since her hospital stay. Her caregiver notes that she has a history of using medication for anxiety years ago. She also experiences migraines, which are exacerbated by lack of sleep.  She has a history of respiratory issues, including a cough that responds well to Tessalon . She uses Trelegy for her respiratory condition and has been managing well without a spacer. She also experiences some wheezing and  discomfort when taking deep breaths, which may be related to pleuritis.  Her appetite has been stable, and she maintains a diet that includes toast, banana, yogurt, potato salad, baked potato, shepherd's pie, and fish and chips. She has cut out crisps and potato chips from her diet. She also consumes Ensure milkshakes for additional protein intake, usually drinking half a shake at a time.      Summary of Hospitalization Dates of Hospitalization : 05/22/2023 until 05/26/2023 Chief reason for hospitalization : HF exacerbation  Secondary problems :  Acute on chronic systolic CHF (congestive heart failure) (HCC)   Acute on chronic hypoxic respiratory failure (HCC)   COPD with acute exacerbation (HCC)   Atrial fibrillation with RVR (HCC)   Essential hypertension   Iron  deficiency anemia   Myocardial injury   Uncontrolled type 2 diabetes mellitus with hyperglycemia, with long-term current use of insulin  (HCC)   Acute pulmonary edema (HCC)   Dilated cardiomyopathy (HCC)   GERD (gastroesophageal reflux disease)  Follow up recommendations :  Complete prednisone  for 1 more day Follow up at HF clinic Consider starting aldactone and/or farxiga Continue increased dose of bisoprolol  10 mg twice daily. Continue low-dose Cozaar .  For afib: Patient on digoxin  and bisoprolol  twice daily.  Anticoagulated with edoxaban    Brief summary :  89 y.o. female with medical history significant of PAF, COPD, CAD status post stenting, DM2, brought in by family  members for worsening cough, wheezing, shortness of breath, started 1 week prior, PCP tx for COPD exac, given doxy rx, inhalers, tessalon . Noted edema bilateral ankles x2 days. Daughter also reported that last 2 days, a wearable telemonitoring device on patient's arm show  A-fib.  As compared to baseline patient has rate controlled A-fib usually heart rate in the 70-80 range.  Admitted for HF exacerbation, COPD exacerbation and afib w RVR in setting of acute  illness.  For further details, please see D/C summary dated : 05/26/2023  Interval History Today, the patient reports she is overall doing better. She reports deconditioning since last visit as well as significant weight loss Ongoing cough but improved   Wt Readings from Last 3 Encounters:  05/31/23 102 lb 4.8 oz (46.4 kg)  05/26/23 112 lb 3.4 oz (50.9 kg)  05/05/23 114 lb (51.7 kg)    Relevant past medical, surgical, family and social history reviewed and updated as indicated. Interim medical history since our last visit reviewed. Allergies and medications reviewed and updated.  ROS per HPI unless specifically indicated above     Objective:    BP (!) 99/57 (BP Location: Left Arm, Patient Position: Sitting, Cuff Size: Small)   Pulse 80   Temp 98.9 F (37.2 C) (Oral)   Resp 15   Wt 102 lb 4.8 oz (46.4 kg)   SpO2 (!) 89%   BMI 17.02 kg/m   Wt Readings from Last 3 Encounters:  05/31/23 102 lb 4.8 oz (46.4 kg)  05/26/23 112 lb 3.4 oz (50.9 kg)  05/05/23 114 lb (51.7 kg)     Physical Exam Constitutional:      Appearance: Normal appearance.  HENT:     Head: Normocephalic and atraumatic.  Eyes:     Pupils: Pupils are equal, round, and reactive to light.  Cardiovascular:     Rate and Rhythm: Normal rate and regular rhythm.     Pulses: Normal pulses.     Heart sounds: Normal heart sounds.  Pulmonary:     Effort: Pulmonary effort is normal.     Breath sounds: Normal breath sounds.     Comments: Faint wheezing initially heard and cleared with deep inspiration Abdominal:     General: Abdomen is flat.     Palpations: Abdomen is soft.  Musculoskeletal:        General: Normal range of motion.     Cervical back: Normal range of motion.  Skin:    General: Skin is warm and dry.     Capillary Refill: Capillary refill takes less than 2 seconds.  Neurological:     General: No focal deficit present.     Mental Status: She is alert. Mental status is at baseline.   Psychiatric:        Mood and Affect: Mood normal.        Behavior: Behavior normal.        05/05/2023   12:39 PM  Depression screen PHQ 2/9  Decreased Interest 0  Down, Depressed, Hopeless 0  PHQ - 2 Score 0  Altered sleeping 1  Tired, decreased energy 0  Change in appetite 0  Feeling bad or failure about yourself  0  Trouble concentrating 0  Moving slowly or fidgety/restless 0  PHQ-9 Score 1  Difficult doing work/chores Not difficult at all       05/05/2023   12:40 PM  GAD 7 : Generalized Anxiety Score  Nervous, Anxious, on Edge 0  Control/stop worrying 0  Worry too much -  different things 0  Trouble relaxing 0  Restless 0  Easily annoyed or irritable 0  Afraid - awful might happen 0  Total GAD 7 Score 0  Anxiety Difficulty Not difficult at all       Assessment & Plan:  Assessment & Plan   Hospital discharge follow-up Hospitalization reviewed, medications reviewed. Plan and follow up for acute concerns below.  Chronic systolic congestive heart failure (HCC) Atrial fibrillation, unspecified type (HCC) Chronic obstructive pulmonary disease, unspecified COPD type (HCC) Assessment & Plan: Recent hospitalization for acute HF exarcerbation, afib w RVR and COPD exacerbation. Today with some residual wheezing and discomfort in the chest. Currently on Tessalon  for cough. Completed prednisone  and Lasix  20mg  daily after hospitalization. She is no longer taking lasix . Has upcoming appointment at the HF clinic. She sounds good and was able to clear faint wheezing with deep breathing. Her o2 was 89% today which I suspect is her baseline. Completed walk test for oxygen therapy at home. Blood pressure on the lower end while on Bisoprolol  10mg  twice daily and Losartan . I am concerned she is running hypotensive at home, recommend halfiing losartan  until HF clinic evaluation. Hold off on adding aldactone or farxiga. Continue trellegy and albuterol  as needed and other recs  below. -Consider reducing Losartan  dose if blood pressure continues to dip low. -Continue Tessalon  as needed for cough. -Encourage use of incentive spirometry to improve lung function. -Consider Tylenol  for chest discomfort, possibly related to pleuritis. -Consider Voltaren gel for topical pain relief. -     6 minute walk; Future -     Supplemental oxygen during test  Diabetes mellitus type 2, diet-controlled (HCC) Diet controlled. Can discontinue the metformin .   Insomnia, unspecified type Noted to have insomnia recently after hospitalization. Will trial trazodone . Ok to use half for any breakthrough anxiety as well. -     traZODone  HCl; Take 0.5-1 tablets (25-50 mg total) by mouth at bedtime as needed for sleep.  Dispense: 30 tablet; Refill: 3  BMI less than 19,adult Concerned about degree of weight loss and deconditioning since admission. Encouraged ongoing nutrition with protein shakes. Additionally, recommended mobility exercises. Liberalize diet. Follow up in 1 month.  Follow up plan: Return in about 4 weeks (around 06/28/2023) for HF, COPD.  Kayd Launer SHAUNNA NETT, MD  Approximately 45 minutes spent on patient encounter today including assessment, counseling, diagnosing, treatment plan development, and charting.

## 2023-05-31 NOTE — Assessment & Plan Note (Signed)
 Diet controlled. Can discontinue the metformin .

## 2023-05-31 NOTE — Telephone Encounter (Signed)
 Copied from CRM [id number]. Topic: General - Other >> May 31, 2023 10:57 AM Victoria B wrote: Reason for CRM: Damien form [facility], called n about moving pt strt of care for PT til tomorrow, because she didn't want to do today because of Dr appt she had

## 2023-05-31 NOTE — Telephone Encounter (Signed)
 Christine with [facility] (nurse) is calling back to see if it is ok for Physical Therapists to go to out to see the pt tomorrow. Please advise.  [phone]

## 2023-05-31 NOTE — Assessment & Plan Note (Addendum)
 Recent hospitalization for acute HF exarcerbation, afib w RVR and COPD exacerbation. Today with some residual wheezing and discomfort in the chest. Currently on Tessalon  for cough. Completed prednisone  and Lasix  20mg  daily after hospitalization. She is no longer taking lasix . Has upcoming appointment at the HF clinic. She sounds good and was able to clear faint wheezing with deep breathing. Her o2 was 89% today which I suspect is her baseline. Completed walk test for oxygen therapy at home. Blood pressure on the lower end while on Bisoprolol  10mg  twice daily and Losartan . I am concerned she is running hypotensive at home, recommend halfiing losartan  until HF clinic evaluation. Continue trellegy and albuterol  as needed and other recs below. -Consider reducing Losartan  dose if blood pressure continues to dip low. -Continue Tessalon  as needed for cough. -Encourage use of incentive spirometry to improve lung function. -Consider Tylenol  for chest discomfort, possibly related to pleuritis. -Consider Voltaren gel for topical pain relief.

## 2023-05-31 NOTE — Progress Notes (Signed)
 6 minute walk test requested by provider for patient to be able to qua;ify for a portable oxygen concentrator.   Starting time 11:10 am Starting pulse rate 80 bpm Starting O2% 88  Walk test started and patient walk able to complete 2.30 minutes with one short rest. During test she was able to maintain an O2 % at 95% or greater.

## 2023-06-01 ENCOUNTER — Telehealth: Payer: Self-pay | Admitting: Family

## 2023-06-01 NOTE — Telephone Encounter (Signed)
 Barba Levin PT calling from [facility] is calling for Verbal orders for PT.  Frequency  1 w 1, 2 w 4  Verbal ok on VM CB- [phone]

## 2023-06-01 NOTE — Telephone Encounter (Signed)
 Pt confirmed appt for 06/02/23

## 2023-06-02 ENCOUNTER — Encounter: Payer: Self-pay | Admitting: Family

## 2023-06-02 ENCOUNTER — Ambulatory Visit: Payer: 59 | Attending: Family | Admitting: Family

## 2023-06-02 ENCOUNTER — Telehealth: Payer: Self-pay | Admitting: Pediatrics

## 2023-06-02 VITALS — BP 104/42 | HR 54 | Wt 107.0 lb

## 2023-06-02 DIAGNOSIS — H538 Other visual disturbances: Secondary | ICD-10-CM | POA: Insufficient documentation

## 2023-06-02 DIAGNOSIS — I5022 Chronic systolic (congestive) heart failure: Secondary | ICD-10-CM | POA: Diagnosis not present

## 2023-06-02 DIAGNOSIS — I4891 Unspecified atrial fibrillation: Secondary | ICD-10-CM | POA: Diagnosis not present

## 2023-06-02 DIAGNOSIS — I130 Hypertensive heart and chronic kidney disease with heart failure and stage 1 through stage 4 chronic kidney disease, or unspecified chronic kidney disease: Secondary | ICD-10-CM | POA: Diagnosis not present

## 2023-06-02 DIAGNOSIS — N182 Chronic kidney disease, stage 2 (mild): Secondary | ICD-10-CM | POA: Diagnosis not present

## 2023-06-02 DIAGNOSIS — R0602 Shortness of breath: Secondary | ICD-10-CM | POA: Diagnosis present

## 2023-06-02 DIAGNOSIS — J449 Chronic obstructive pulmonary disease, unspecified: Secondary | ICD-10-CM | POA: Insufficient documentation

## 2023-06-02 DIAGNOSIS — E1122 Type 2 diabetes mellitus with diabetic chronic kidney disease: Secondary | ICD-10-CM | POA: Diagnosis not present

## 2023-06-02 DIAGNOSIS — I428 Other cardiomyopathies: Secondary | ICD-10-CM | POA: Diagnosis not present

## 2023-06-02 DIAGNOSIS — Z87891 Personal history of nicotine dependence: Secondary | ICD-10-CM | POA: Insufficient documentation

## 2023-06-02 DIAGNOSIS — Z7901 Long term (current) use of anticoagulants: Secondary | ICD-10-CM | POA: Insufficient documentation

## 2023-06-02 DIAGNOSIS — Z79899 Other long term (current) drug therapy: Secondary | ICD-10-CM | POA: Insufficient documentation

## 2023-06-02 MED ORDER — LOSARTAN POTASSIUM 25 MG PO TABS: 12.5000 mg | ORAL_TABLET | Freq: Every day | ORAL | 3 refills | Status: DC

## 2023-06-02 NOTE — Patient Instructions (Addendum)
 DECREASE Losartan  to 12.5 mg ( 1/2 Tab) daily.  Go DOWN to LOWER LEVEL (LL) to have your blood work completed inside of [employer] office.  We will only call you if the results are abnormal or if the provider would like to make medication changes.   You have been referred to General Cardiology. They will call you to arrange your appointment.  You have been referred to Pulmonology. They will call you to arrange your appointment.  Do the following things EVERYDAY: Weigh yourself in the morning before breakfast. Write it down and keep it in a log. Take your medicines as prescribed Eat low salt foods--Limit salt (sodium) to 2000 mg per day.  Stay as active as you can everyday Limit all fluids for the day to less than 2 liters  Your physician recommends that you schedule a follow-up appointment in: 1 month  If you have any questions or concerns before your next appointment please send us  a message through mychart or call our office at [phone]    TO LEAVE A MESSAGE FOR THE NURSE SELECT OPTION 2, PLEASE LEAVE A MESSAGE INCLUDING: YOUR NAME DATE OF BIRTH CALL BACK NUMBER REASON FOR CALL**this is important as we prioritize the call backs  YOU WILL RECEIVE A CALL BACK THE SAME DAY AS LONG AS YOU CALL BEFORE 4:00 PM

## 2023-06-02 NOTE — Telephone Encounter (Signed)
 Home Health Verbal Orders - Caller/Agency: Rosina with [facility] Callback Number: [phone] Service Requested: Occupational Therapy Frequency: Evaluation starting next week Any new concerns about the patient? No   Please assist patient further

## 2023-06-02 NOTE — Progress Notes (Signed)
 Advanced Heart Failure Clinic Note   Referring Physician: recent admission PCP: Herold Hadassah SQUIBB, MD (last seen 02/25) Cardiologist: [facility] during recent admission  Chief Complaint: shortness of breath  HPI:  Angelica Ferguson is a 89 y/o female with a history of COPD, atrial fibrillation, CAD s/p stenting, HTN, IDA, T2DM, GERD, HOH and chronic heart failure.   Admitted 05/22/23 due to cough, wheezing and shortness of breath for ~  1 week PTA. CXR showed pulmonary congestion. CTA negative for PE but bilateral multifocal infiltrates. Was in AF RVR.  IV cardizem  given and drip started. IV lasix  provided and then transitioned to oral diuretics. Cardiology consulted. Echocardiogram showed an EF of 25 to 30%. Cardizem  then stopped due to low EF and digoxin  started. Home oxygen @ 2L ordered. IV solu-medrol  given for COPD exacerbation.   Echo 05/23/23: EF 25-30%, RV normal, normal PA pressure, mild LAE, mild/ moderate MR  She presents today, with her daughter, for her initial visit with a chief complaint of shortness of breath with exertion. She feels like this is improving since her recent admission. She has associated intermittent blurry vision along with this. Denies fatigue, chest pain, cough, palpitations, abdominal distention, pedal edema, dizziness or difficulty sleeping. Reports having a good appetite.   Weighing daily and weight ranges from 102-104 pounds. BP at home has been lower than her normal at 89/42 & 94/43. Does have oxygen at home that she wears PRN. Says that she generally wears it daily for about 20-30 minutes. Does not wear it at bedtime.    Review of Systems: [y] = yes, [ ]  = no   General: Weight gain [ ] ; Weight loss [ ] ; Anorexia [ ] ; Fatigue [ ] ; Fever [ ] ; Chills [ ] ; Blurry vision davis.dad ]  Cardiac: Chest pain/pressure [ ] ; Resting SOB [ ] ; Exertional SOB davis.dad ]; Orthopnea [ ] ; Pedal Edema [ ] ; Palpitations [ ] ; Syncope [ ] ; Presyncope [ ] ; Paroxysmal nocturnal dyspnea[ ]   Pulmonary:  Cough [ ] ; Wheezing[ ] ; Hemoptysis[ ] ; Sputum [ ] ; Snoring [ ]   GI: Vomiting[ ] ; Dysphagia[ ] ; Melena[ ] ; Hematochezia [ ] ; Heartburn[ ] ; Abdominal pain [ ] ; Constipation [ ] ; Diarrhea [ ] ; BRBPR [ ]   GU: Hematuria[ ] ; Dysuria [ ] ; Nocturia[ ]   Vascular: Pain in legs with walking [ ] ; Pain in feet with lying flat [ ] ; Non-healing sores [ ] ; Stroke [ ] ; TIA [ ] ; Slurred speech [ ] ;  Neuro: Headaches[ ] ; Vertigo[ ] ; Seizures[ ] ; Paresthesias[ ] ;Blurred vision [ ] ; Diplopia [ ] ; Vision changes [ ]   Ortho/Skin: Arthritis [ ] ; Joint pain [ ] ; Muscle pain [ ] ; Joint swelling [ ] ; Back Pain [ ] ; Rash [ ]   Psych: Depression[ ] ; Anxiety[ ]   Heme: Bleeding problems [ ] ; Clotting disorders [ ] ; Anemia [ ]   Endocrine: Diabetes [ y]; Thyroid dysfunction[ ]    Past Medical History:  Diagnosis Date   Acute blood loss anemia 12/04/2020   Acute on chronic hypoxic respiratory failure (HCC) 05/24/2023   Acute pulmonary edema (HCC) 05/24/2023   Acute renal failure (HCC) 12/04/2020   Allergic rhinitis 02/20/2023   Atrial fibrillation (HCC)    CAD (coronary artery disease)    Cerebrovascular accident (HCC) 04/26/2015   CHF (congestive heart failure) (HCC)    Chronic kidney disease, stage 2 (mild) 03/30/2023   Closed fracture of right hip requiring operative repair, sequela 11/23/2020   COPD (chronic obstructive pulmonary disease) (HCC)    Coronary stent patent 06/19/2020  Diabetes mellitus without complication (HCC)    Former light tobacco smoker 06/22/2021   GERD (gastroesophageal reflux disease)    H/O TB (tuberculosis)    approx 1969.  Received treatment.   Hip fracture (HCC) 11/16/2020   History of CVA in adulthood 2016   TIA - no deficits   History of heart artery stent    approx 2012   Hyperlipidemia    Hypertension    Malnutrition of moderate degree 11/18/2020   Myocardial injury 05/24/2023   Normocytic anemia 11/03/2022   Pressure injury of skin 11/24/2020   Wears dentures    full upper  and lower   Wears hearing aid in both ears     Current Outpatient Medications  Medication Sig Dispense Refill   albuterol  (VENTOLIN  HFA) 108 (90 Base) MCG/ACT inhaler Inhale 1 puff into the lungs every 6 (six) hours as needed for wheezing or shortness of breath.     atorvastatin  (LIPITOR ) 40 MG tablet Take 1 tablet by mouth daily.     benzonatate  (TESSALON ) 200 MG capsule Take 200 mg by mouth 3 (three) times daily as needed.     bisoprolol  (ZEBETA ) 10 MG tablet Take 1 tablet (10 mg total) by mouth 2 (two) times daily. 60 tablet 0   digoxin  (LANOXIN ) 0.125 MG tablet Take 1 tablet (0.125 mg total) by mouth daily. 30 tablet 0   edoxaban  (SAVAYSA ) 30 MG TABS tablet Take 1 tablet (30 mg total) by mouth daily. 30 tablet 0   Fluticasone -Umeclidin-Vilant (TRELEGY ELLIPTA ) 100-62.5-25 MCG/ACT AEPB Inhale 1 puff into the lungs daily. 1 each 11   furosemide  (LASIX ) 20 MG tablet Take 1 tablet (20 mg total) by mouth daily. 30 tablet 0   losartan  (COZAAR ) 25 MG tablet Take 1 tablet (25 mg total) by mouth at bedtime. 30 tablet 0   pantoprazole  (PROTONIX ) 40 MG tablet Take 1 tablet (40 mg total) by mouth daily. 30 tablet 0   traZODone  (DESYREL ) 50 MG tablet Take 0.5-1 tablets (25-50 mg total) by mouth at bedtime as needed for sleep. 30 tablet 3   No current facility-administered medications for this visit.    Allergies  Allergen Reactions   Sulfa Antibiotics     Blood pressure dropped      Social History   Socioeconomic History   Marital status: Widowed    Spouse name: Not on file   Number of children: Not on file   Years of education: Not on file   Highest education level: Not on file  Occupational History   Not on file  Tobacco Use   Smoking status: Former    Current packs/day: 0.00    Average packs/day: 0.5 packs/day for 15.0 years (7.5 ttl pk-yrs)    Types: Cigarettes    Start date: 89    Quit date: 54    Years since quitting: 54.1   Smokeless tobacco: Never  Vaping Use    Vaping status: Never Used  Substance and Sexual Activity   Alcohol  use: Not Currently    Comment: Rare   Drug use: Never   Sexual activity: Not Currently  Other Topics Concern   Not on file  Social History Narrative   Not on file   Social Drivers of Health   Financial Resource Strain: Low Risk  (05/30/2023)   Overall Financial Resource Strain (CARDIA)    Difficulty of Paying Living Expenses: Not hard at all  Food Insecurity: No Food Insecurity (05/30/2023)   Hunger Vital Sign    Worried About  Running Out of Food in the Last Year: Never true    Ran Out of Food in the Last Year: Never true  Transportation Needs: No Transportation Needs (05/30/2023)   PRAPARE - Administrator, Civil Service (Medical): No    Lack of Transportation (Non-Medical): No  Physical Activity: Unknown (05/30/2023)   Exercise Vital Sign    Days of Exercise per Week: 0 days    Minutes of Exercise per Session: Not on file  Stress: No Stress Concern Present (05/30/2023)   [employer] of Occupational Health - Occupational Stress Questionnaire    Feeling of Stress : Only a little  Social Connections: Socially Isolated (05/30/2023)   Social Connection and Isolation Panel [NHANES]    Frequency of Communication with Friends and Family: Never    Frequency of Social Gatherings with Friends and Family: Never    Attends Religious Services: Never    Database Administrator or Organizations: No    Attends Banker Meetings: Never    Marital Status: Widowed  Intimate Partner Violence: Not At Risk (05/23/2023)   Humiliation, Afraid, Rape, and Kick questionnaire    Fear of Current or Ex-Partner: No    Emotionally Abused: No    Physically Abused: No    Sexually Abused: No      Family History  Problem Relation Age of Onset   Heart disease Sister    Cancer Sister    Vitals:   06/02/23 1004  BP: (!) 104/42  Pulse: (!) 54  SpO2: 95%  Weight: 107 lb (48.5 kg)   Wt Readings from Last 3  Encounters:  06/02/23 107 lb (48.5 kg)  05/31/23 102 lb 4.8 oz (46.4 kg)  05/26/23 112 lb 3.4 oz (50.9 kg)   Lab Results  Component Value Date   CREATININE 0.91 05/26/2023   CREATININE 0.81 05/25/2023   CREATININE 0.96 05/24/2023   PHYSICAL EXAM:  General: Well appearing. No resp difficulty HEENT: normal except HOH Neck: supple, no JVD Cor: Irregular rhythm, bradycardic. No rubs, gallops or murmurs Lungs: clear Abdomen: soft, nontender, nondistended. Extremities: no cyanosis, clubbing, rash, edema Neuro: alert & oriented X 3. Moves all 4 extremities w/o difficulty. Affect pleasant   ECG: not done   ASSESSMENT & PLAN:  1: NICM with reduced ejection fraction- - suspect due to atrial fibrillation - NYHA class II - euvolemic - weighing daily; home weight chart reviewed; reminded to call for overnight weight gain of >2 pounds or weekly weight gain of > 5 pounds - Echo 05/23/23: EF 25-30%, RV normal, normal PA pressure, mild LAE, mild/ moderate MR - continue bisoprolol  10mg  BID - continue digoxin  0.125mg  daily - continue furosemide  20mg  daily - decrease losartan  to 12.5mg  daily due to BP - BP too low (especially at home) to add MRA; consider SGLT2 and decreasing furosemide  to PRN at next visit - BMET/ dig level today - BNP 05/22/23 reviewed and was 1759.2  2: HTN- - BP 103/42 - decreasing losartan  per above - saw PCP [place]) 02/25; returns next week - BMET 05/26/23 reviewed and showed sodium 138, potassium 4.0, creatinine 0.91 & GFR >60 - BMET today  3: T2DM (managed by PCP)- - A1c 05/22/23 reviewed and was 6.5% - continue atorvastatin  40mg  daily - will need to check lipids at next visit  4: Atrial fibrillation- - continue bisoprolol  10mg  BID - continue digoxin  0.125mg  daily - continue edoxaban  30mg  daily - dig level today - cardiology referral placed; saw [facility] cardiology during recent admission  5: COPD- - has O2 at 2L at home that she can wear PRN; generally  wears it daily for 20-30 minutes - pulmonology referral placed  Return in 1 month, sooner if needed.   Ellouise DELENA Class, FNP 06/02/23

## 2023-06-03 LAB — BASIC METABOLIC PANEL
BUN/Creatinine Ratio: 27 (ref 12–28)
BUN: 32 mg/dL — ABNORMAL HIGH (ref 8–27)
CO2: 28 mmol/L (ref 20–29)
Calcium: 8.4 mg/dL — ABNORMAL LOW (ref 8.7–10.3)
Chloride: 96 mmol/L (ref 96–106)
Creatinine, Ser: 1.17 mg/dL — ABNORMAL HIGH (ref 0.57–1.00)
Glucose: 128 mg/dL — ABNORMAL HIGH (ref 70–99)
Potassium: 3.7 mmol/L (ref 3.5–5.2)
Sodium: 140 mmol/L (ref 134–144)
eGFR: 45 mL/min/{1.73_m2} — ABNORMAL LOW (ref >59)

## 2023-06-03 LAB — DIGOXIN LEVEL: Digoxin, Serum: 2.5 ng/mL (ref 0.5–0.9)

## 2023-06-04 ENCOUNTER — Telehealth: Payer: Self-pay | Admitting: Family

## 2023-06-04 ENCOUNTER — Other Ambulatory Visit: Payer: Self-pay | Admitting: Family

## 2023-06-04 NOTE — Telephone Encounter (Signed)
 Spoke with patient's daughter, Devere, regarding patient's BMET and digoxin  level. Digoxin  level is quite elevated at 2.5. Myles Devere to stop digoxin  completely and will not restart. She will keep an eye on her weight, BP, HR. She does say that patient's blurry vision seems to be a little better. Devere said she had already given patient the medication this morning but verbalized understanding to no longer give patient digoxin .

## 2023-06-05 ENCOUNTER — Encounter: Payer: Self-pay | Admitting: Family

## 2023-06-06 ENCOUNTER — Telehealth: Payer: Self-pay | Admitting: Pediatrics

## 2023-06-06 NOTE — Telephone Encounter (Signed)
Verbal order given to [facility]

## 2023-06-06 NOTE — Telephone Encounter (Signed)
Elmer Bales with [phone] requests Verbal orders for PT.  Frequency: 1 w 1, 2 w 4 CB- [phone]  Verbal ok on VM

## 2023-06-09 ENCOUNTER — Telehealth: Payer: Self-pay | Admitting: Pediatrics

## 2023-06-09 ENCOUNTER — Encounter: Payer: Self-pay | Admitting: Family

## 2023-06-09 NOTE — Telephone Encounter (Signed)
Home Health Verbal Orders - Caller/Agency: Morrie Sheldon from [facility] Callback Number: [phone] Service Requested: Occupational Therapy Frequency: 1x4 Any new concerns about the patient? no

## 2023-06-09 NOTE — Telephone Encounter (Signed)
Called and gave verbal orders per Dr. Evelene Croon.

## 2023-06-12 NOTE — Telephone Encounter (Unsigned)
Copied from CRM [id number]. Topic: General - Other >> Jun 12, 2023  1:01 PM Everette C wrote: Reason for CRM: The patient' daughter has called for an update on a previous request for an order for a portable oxygen concentrator   The patient was tested on 2/05/5 and a request was placed on 06/06/23, per their daughter   [facility] needs information about a lightweight portable oxygen tank   Please contact [name] further at [phone]

## 2023-06-12 NOTE — Telephone Encounter (Signed)
 Called and gave verbal orders per Dr. Evelene Croon.

## 2023-06-15 ENCOUNTER — Telehealth: Payer: Self-pay | Admitting: Pediatrics

## 2023-06-15 NOTE — Telephone Encounter (Signed)
Copied from CRM [id number]. Topic: General - Inquiry >> Jun 13, 2023 11:57 AM Haroldine Laws wrote: Reason for CRM: pt's daughter called again  regarding the concentrator.  She was told by PT that she needs to go out and walk but they can't unless she has the ox concentrator.

## 2023-06-16 NOTE — Telephone Encounter (Signed)
Daughter Angelica Ferguson returning Angelica Ferguson's call.  Was advised Angelica Ferguson is rooming a pt, and will need to call back. Angelica Ferguson would like you to call:  [phone]

## 2023-06-19 ENCOUNTER — Other Ambulatory Visit: Payer: Self-pay

## 2023-06-19 DIAGNOSIS — I5022 Chronic systolic (congestive) heart failure: Secondary | ICD-10-CM

## 2023-06-19 NOTE — Telephone Encounter (Signed)
 Vernona Rieger calling [facility] is calling to speak to [facility] [phone] looking to see if script was received for portable oxygen concentrator.

## 2023-06-21 ENCOUNTER — Telehealth: Payer: Self-pay

## 2023-06-21 NOTE — Telephone Encounter (Signed)
 Returned call and she is aware we have the paperwork for provider to complete and return.

## 2023-06-21 NOTE — Telephone Encounter (Signed)
 Copied from CRM [id number]. Topic: Clinical - Prescription Issue >> Jun 21, 2023 12:45 PM Priscille Loveless wrote: Reason for CRM: Angelica Ferguson with [facility], [phone] called and is needing to know when the order for oxygen will be signed and sent back over. Pt is in high need for this. Please call and advise.

## 2023-06-22 ENCOUNTER — Other Ambulatory Visit: Payer: Self-pay | Admitting: Pediatrics

## 2023-06-22 ENCOUNTER — Telehealth: Payer: Self-pay

## 2023-06-22 ENCOUNTER — Other Ambulatory Visit: Payer: Self-pay | Admitting: Family

## 2023-06-22 ENCOUNTER — Encounter: Payer: Self-pay | Admitting: Family

## 2023-06-22 MED ORDER — FUROSEMIDE 20 MG PO TABS: 20.0000 mg | ORAL_TABLET | Freq: Every day | ORAL | 3 refills | Status: DC

## 2023-06-22 MED ORDER — BISOPROLOL FUMARATE 10 MG PO TABS
10.0000 mg | ORAL_TABLET | Freq: Two times a day (BID) | ORAL | 3 refills | Status: DC
Start: 2023-06-22 — End: 2023-06-28

## 2023-06-22 NOTE — Telephone Encounter (Signed)
 Paperwork completed and returned to [place]. No further needs at this time.

## 2023-06-22 NOTE — Telephone Encounter (Unsigned)
 Copied from CRM [id number]. Topic: Clinical - Home Health Verbal Orders >> Jun 22, 2023  9:30 AM Patsy Lager T wrote: Caller/Agency: Margaretann Loveless from [facility] Callback Number: [phone] Service Requested: Occupational Therapy Frequency: request to discharge patient as she decline to continue with therapy Any new concerns about the patient? No

## 2023-06-23 NOTE — Telephone Encounter (Signed)
 Called and LVM giving verbal orders per Dr. Evelene Croon.

## 2023-06-23 NOTE — Telephone Encounter (Signed)
 Requested medication (s) are due for refill today: yes  Requested medication (s) are on the active medication list: yes  Last refill:  05/26/23 #30/0  Future visit scheduled: yes  Notes to clinic:  Unable to refill per protocol, last refill by another provider. ED provider     Requested Prescriptions  Pending Prescriptions Disp Refills   pantoprazole (PROTONIX) 40 MG tablet [Pharmacy Med Name: PANTOPRAZOLE SOD DR 40 MG TAB] 30 tablet 0    Sig: TAKE 1 TABLET BY MOUTH EVERY DAY     Gastroenterology: Proton Pump Inhibitors Passed - 06/23/2023 12:50 PM      Passed - Valid encounter within last 12 months    Recent Outpatient Visits           1 month ago Chronic obstructive pulmonary disease, unspecified COPD type [facility])   [facility] Jackolyn Confer, MD       Future Appointments             In 5 days Evelene Croon, Atilano Median, MD [facility], [facility]   In 1 month Evelene Croon, Atilano Median, MD [facility], [facility]   In 2 months Debbe Odea, MD [facility]

## 2023-06-26 ENCOUNTER — Other Ambulatory Visit: Payer: Self-pay | Admitting: Pediatrics

## 2023-06-26 ENCOUNTER — Telehealth: Payer: Self-pay

## 2023-06-26 NOTE — Telephone Encounter (Signed)
 Copied from CRM [id number]. Topic: Clinical - Medication Question >> Jun 26, 2023  9:09 AM Maree Krabbe H wrote: Reason for CRM: Patients daughter called in today and her mom is out of a medicine pantoprazole (PROTONIX), I put a request in and daughter wants to know what can her mom do in the meantime since she no longer has any pills left. Patients daughter can be called back at [phone].

## 2023-06-26 NOTE — Telephone Encounter (Signed)
 Copied from CRM [id number]. Topic: Clinical - Medication Refill >> Jun 26, 2023  9:02 AM Yvone Neu wrote: Most Recent Primary Care Visit:  Provider: Jackolyn Confer  Department: [id number]  Visit Type: NEW PATIENT  Date: 05/05/2023  Medication: pantoprazole (PROTONIX)  Has the patient contacted their pharmacy? Yes (Agent: If no, request that the patient contact the pharmacy for the refill. If patient does not wish to contact the pharmacy document the reason why and proceed with request.) (Agent: If yes, when and what did the pharmacy advise?)  Is this the correct pharmacy for this prescription? Yes If no, delete pharmacy and type the correct one.  This is the patient's preferred pharmacy:   [id number] - [place], [place] - [address] [place] [address] Phone: [phone] Fax: [phone]   Has the prescription been filled recently? Yes  Is the patient out of the medication? Yes  Has the patient been seen for an appointment in the last year OR does the patient have an upcoming appointment? Yes  Can we respond through MyChart? Yes  Agent: Please be advised that Rx refills may take up to 3 business days. We ask that you follow-up with your pharmacy.

## 2023-06-26 NOTE — Telephone Encounter (Signed)
 Patient is aware refill request sent to PCP and should be completed and sent to pharmacy in the next 72 hours .

## 2023-06-27 MED ORDER — PANTOPRAZOLE SODIUM 40 MG PO TBEC: 40.0000 mg | DELAYED_RELEASE_TABLET | Freq: Every day | ORAL | 0 refills | Status: DC

## 2023-06-28 ENCOUNTER — Encounter: Payer: Self-pay | Admitting: Pediatrics

## 2023-06-28 ENCOUNTER — Ambulatory Visit (INDEPENDENT_AMBULATORY_CARE_PROVIDER_SITE_OTHER): Payer: 59 | Admitting: Pediatrics

## 2023-06-28 VITALS — BP 117/71 | HR 112 | Temp 98.4°F | Resp 18 | Wt 107.2 lb

## 2023-06-28 DIAGNOSIS — K219 Gastro-esophageal reflux disease without esophagitis: Secondary | ICD-10-CM

## 2023-06-28 DIAGNOSIS — G4700 Insomnia, unspecified: Secondary | ICD-10-CM

## 2023-06-28 DIAGNOSIS — I5022 Chronic systolic (congestive) heart failure: Secondary | ICD-10-CM | POA: Diagnosis not present

## 2023-06-28 DIAGNOSIS — J449 Chronic obstructive pulmonary disease, unspecified: Secondary | ICD-10-CM | POA: Diagnosis not present

## 2023-06-28 DIAGNOSIS — I4891 Unspecified atrial fibrillation: Secondary | ICD-10-CM | POA: Diagnosis not present

## 2023-06-28 MED ORDER — BISOPROLOL FUMARATE 10 MG PO TABS: 10.0000 mg | ORAL_TABLET | Freq: Two times a day (BID) | ORAL | 3 refills | Status: DC

## 2023-06-28 MED ORDER — PANTOPRAZOLE SODIUM 40 MG PO TBEC
40.0000 mg | DELAYED_RELEASE_TABLET | Freq: Every day | ORAL | 3 refills | Status: DC
Start: 2023-06-28 — End: 2023-08-23

## 2023-06-28 MED ORDER — MIRTAZAPINE 15 MG PO TBDP
15.0000 mg | ORAL_TABLET | Freq: Every day | ORAL | 1 refills | Status: DC
Start: 2023-06-28 — End: 2023-08-04

## 2023-06-28 NOTE — Patient Instructions (Signed)
 For insomnia: - stop trazadone - start mirtazapine 15mg 

## 2023-06-28 NOTE — Progress Notes (Signed)
 Office Visit  BP 117/71 (BP Location: Left Arm, Patient Position: Sitting, Cuff Size: Normal)   Pulse (!) 112   Temp 98.4 F (36.9 C) (Oral)   Resp 18   Wt 107 lb 3.2 oz (48.6 kg)   SpO2 95%   BMI 17.84 kg/m    Subjective:    Patient ID: Angelica Ferguson, female    DOB: Feb 27, 1933, 89 y.o.   MRN: [id number]  HPI: Angelica Ferguson is a 89 y.o. female  Chief Complaint  Patient presents with   Congestive Heart Failure    Both she and her daughter feeling better.    COPD    Discussed the use of AI scribe software for clinical note transcription with the patient, who gave verbal consent to proceed.  History of Present Illness   Angelica Ferguson is an 89 year old female who presents with sleep disturbances. She is accompanied by her daughter, who is her primary caregiver.  She experiences significant sleep disturbances, characterized by an inability to sleep well at night despite taking trazodone 50 mg, which is not providing relief. She wakes up around 3:00 AM feeling itchy all over, a symptom that has persisted every night since her discharge from the hospital. As a result, she feels tired in the morning and throughout the day due to poor sleep quality.  She has a history of atrial fibrillation and is concerned about her heart rate, which often ranges from the mid-eighties to mid-nineties, occasionally exceeding 110. Her blood pressure readings have improved, typically ranging from 113/67 to 125/56, and no longer drop below 90 systolic. She is currently taking losartan at half the previous dose.  Her oxygen levels are lower in the mornings, around 87-88%, but improve to 90-91% with activity and reach 97% with supplemental oxygen. She uses oxygen for about 20 minutes at a time as needed.  She experiences tightness in her ankles without noticeable swelling, and her weight remains stable at approximately 104.5 pounds. She applies cream to her ankles and uses Voltaren gel for potential  inflammation.  She has been engaging in more physical activities, such as going to the park and having tea outings, and has stopped using a wheelchair. She continues to perform exercises regularly and has recently stopped occupational therapy, feeling confident in her mobility improvements.      Wt Readings from Last 3 Encounters:  06/30/23 107 lb (48.5 kg)  06/28/23 107 lb 3.2 oz (48.6 kg)  06/02/23 107 lb (48.5 kg)    Relevant past medical, surgical, family and social history reviewed and updated as indicated. Interim medical history since our last visit reviewed. Allergies and medications reviewed and updated.  ROS per HPI unless specifically indicated above     Objective:    BP 117/71 (BP Location: Left Arm, Patient Position: Sitting, Cuff Size: Normal)   Pulse (!) 112   Temp 98.4 F (36.9 C) (Oral)   Resp 18   Wt 107 lb 3.2 oz (48.6 kg)   SpO2 95%   BMI 17.84 kg/m   Wt Readings from Last 3 Encounters:  06/30/23 107 lb (48.5 kg)  06/28/23 107 lb 3.2 oz (48.6 kg)  06/02/23 107 lb (48.5 kg)     Physical Exam Constitutional:      Appearance: Normal appearance.  HENT:     Head: Normocephalic and atraumatic.  Eyes:     Pupils: Pupils are equal, round, and reactive to light.  Cardiovascular:     Rate and Rhythm: Regular rhythm. Tachycardia present.  Pulses: Normal pulses.     Heart sounds: Normal heart sounds.     Comments: 90-95 BMP Pulmonary:     Effort: Pulmonary effort is normal.     Breath sounds: Normal breath sounds.  Abdominal:     General: Abdomen is flat.     Palpations: Abdomen is soft.  Musculoskeletal:        General: Normal range of motion.     Cervical back: Normal range of motion.  Skin:    General: Skin is warm and dry.     Capillary Refill: Capillary refill takes less than 2 seconds.  Neurological:     General: No focal deficit present.     Mental Status: She is alert. Mental status is at baseline.  Psychiatric:        Mood and Affect:  Mood normal.        Behavior: Behavior normal.         06/28/2023   11:08 AM 05/05/2023   12:39 PM  Depression screen PHQ 2/9  Decreased Interest 0 0  Down, Depressed, Hopeless 0 0  PHQ - 2 Score 0 0  Altered sleeping 3 1  Tired, decreased energy 1 0  Change in appetite 0 0  Feeling bad or failure about yourself  0 0  Trouble concentrating 0 0  Moving slowly or fidgety/restless 0 0  Suicidal thoughts 0   PHQ-9 Score 4 1  Difficult doing work/chores Somewhat difficult Not difficult at all       06/28/2023   11:08 AM 05/05/2023   12:40 PM  GAD 7 : Generalized Anxiety Score  Nervous, Anxious, on Edge 0 0  Control/stop worrying 0 0  Worry too much - different things 0 0  Trouble relaxing 0 0  Restless 0 0  Easily annoyed or irritable 0 0  Afraid - awful might happen 0 0  Total GAD 7 Score 0 0  Anxiety Difficulty  Not difficult at all   EKG 07/06/2023: (my read) Afib w/o RVR 95 BPM No ST or T wave changes seen compared to prior     Assessment & Plan:  Assessment & Plan   Chronic systolic congestive heart failure [facility]) Assessment & Plan: Following in HF clinic. Weight stable at home. Asymptomatic. Continue f/u with cardiology. Med rec completed.   Chronic obstructive pulmonary disease, unspecified COPD type (HCC) Assessment & Plan: On trellegy upcoming visit with pulm.    Atrial fibrillation, unspecified type [facility]) Assessment & Plan: Heart rate consistently in the 90s, with a reading of 110 during the visit. Currently managed with bisoprolol. On edoxaban. Asymptomatic. EKG as below. -EKG with Afib, not in RVR - has f/u with cardiology this week, sent FYI message.  Orders: -     Bisoprolol Fumarate; Take 1 tablet (10 mg total) by mouth 2 (two) times daily.  Dispense: 180 tablet; Refill: 3 -     EKG 12-Lead  Insomnia, unspecified type Assessment & Plan: Poor sleep despite Trazodone 50mg  nightly. No improvement with current medication, suggesting it may not be the  right choice. -Discontinue Trazodone. -Start Mirtazapine (Remeron) 15mg  nightly, beginning with half a tablet.  Orders: -     Mirtazapine; Take 1 tablet (15 mg total) by mouth at bedtime.  Dispense: 30 tablet; Refill: 1  Gastroesophageal reflux disease without esophagitis Assessment & Plan: Doing well, requesting refills.  Orders: -     Pantoprazole Sodium; Take 1 tablet (40 mg total) by mouth daily.  Dispense: 90 tablet; Refill: 3  Follow up plan: Return in about 4 weeks (around 07/26/2023) for insomnia.  Jackolyn Confer, MD

## 2023-06-29 ENCOUNTER — Ambulatory Visit: Payer: 59 | Admitting: Pediatrics

## 2023-06-29 ENCOUNTER — Telehealth: Payer: Self-pay | Admitting: Family

## 2023-06-29 NOTE — Progress Notes (Signed)
 Advanced Heart Failure Clinic Note    PCP: Jackolyn Confer, MD (last seen 03/25) Cardiologist: [facility] during recent admission  Chief Complaint:   HPI:  Ms Manges is a 89 y/o female with a history of COPD, atrial fibrillation, CAD s/p stenting, HTN, IDA, T2DM, GERD, HOH and chronic heart failure.   Admitted 05/22/23 due to cough, wheezing and shortness of breath for ~  1 week PTA. CXR showed pulmonary congestion. CTA negative for PE but bilateral multifocal infiltrates. Was in AF RVR.  IV cardizem given and drip started. IV lasix provided and then transitioned to oral diuretics. Cardiology consulted. Echocardiogram showed an EF of 25 to 30%. Cardizem then stopped due to low EF and digoxin started. Home oxygen @ 2L ordered. IV solu-medrol given for COPD exacerbation.   Echo 05/23/23: EF 25-30%, RV normal, normal PA pressure, mild LAE, mild/ moderate MR  She presents today, with her daughter, for a HF follow-up visit with a chief complaint of minimal shortness of breath. She has a loose cough & difficulty sleeping along with this. She denies chest pain, palpitations, abdominal distention, pedal edema, dizziness or weight gain. She will be starting remeron tonight to help with her sleep. Does have pulmonology and cardiology appointments scheduled.   Since last visit, digoxin was stopped due to elevated dig level. She says that since that time, her visual issues have cleared up completely.    ROS: All systems negative except what is listed in HPI, PMH and Problem List   Past Medical History:  Diagnosis Date   Acute blood loss anemia 12/04/2020   Acute on chronic hypoxic respiratory failure (HCC) 05/24/2023   Acute pulmonary edema (HCC) 05/24/2023   Acute renal failure (HCC) 12/04/2020   Allergic rhinitis 02/20/2023   Atrial fibrillation (HCC)    CAD (coronary artery disease)    Cerebrovascular accident (HCC) 04/26/2015   CHF (congestive heart failure) (HCC)    Chronic kidney disease,  stage 2 (mild) 03/30/2023   Closed fracture of right hip requiring operative repair, sequela 11/23/2020   COPD (chronic obstructive pulmonary disease) (HCC)    Coronary stent patent 06/19/2020   Diabetes mellitus without complication (HCC)    Former light tobacco smoker 06/22/2021   GERD (gastroesophageal reflux disease)    H/O TB (tuberculosis)    approx 1969.  Received treatment.   Hip fracture (HCC) 11/16/2020   History of CVA in adulthood 2016   TIA - no deficits   History of heart artery stent    approx 2012   Hyperlipidemia    Hypertension    Malnutrition of moderate degree 11/18/2020   Myocardial injury 05/24/2023   Normocytic anemia 11/03/2022   Pressure injury of skin 11/24/2020   Wears dentures    full upper and lower   Wears hearing aid in both ears     Current Outpatient Medications  Medication Sig Dispense Refill   albuterol (VENTOLIN HFA) 108 (90 Base) MCG/ACT inhaler Inhale 1 puff into the lungs every 6 (six) hours as needed for wheezing or shortness of breath.     atorvastatin (LIPITOR) 40 MG tablet Take 1 tablet by mouth daily.     benzonatate (TESSALON) 200 MG capsule Take 200 mg by mouth 3 (three) times daily as needed.     bisoprolol (ZEBETA) 10 MG tablet Take 1 tablet (10 mg total) by mouth 2 (two) times daily. 180 tablet 3   edoxaban (SAVAYSA) 30 MG TABS tablet Take 1 tablet (30 mg total) by mouth daily. 30  tablet 0   Fluticasone-Umeclidin-Vilant (TRELEGY ELLIPTA) 100-62.5-25 MCG/ACT AEPB Inhale 1 puff into the lungs daily. 1 each 11   furosemide (LASIX) 20 MG tablet Take 1 tablet (20 mg total) by mouth daily. 90 tablet 3   losartan (COZAAR) 25 MG tablet Take 0.5 tablets (12.5 mg total) by mouth at bedtime. 45 tablet 3   mirtazapine (REMERON SOL-TAB) 15 MG disintegrating tablet Take 1 tablet (15 mg total) by mouth at bedtime. 30 tablet 1   pantoprazole (PROTONIX) 40 MG tablet Take 1 tablet (40 mg total) by mouth daily. 90 tablet 3   No current  facility-administered medications for this visit.    Allergies  Allergen Reactions   Sulfa Antibiotics     Blood pressure dropped      Social History   Socioeconomic History   Marital status: Widowed    Spouse name: Not on file   Number of children: Not on file   Years of education: Not on file   Highest education level: Not on file  Occupational History   Not on file  Tobacco Use   Smoking status: Former    Current packs/day: 0.00    Average packs/day: 0.5 packs/day for 15.0 years (7.5 ttl pk-yrs)    Types: Cigarettes    Start date: 62    Quit date: 89    Years since quitting: 54.2   Smokeless tobacco: Never  Vaping Use   Vaping status: Never Used  Substance and Sexual Activity   Alcohol use: Not Currently    Comment: Rare   Drug use: Never   Sexual activity: Not Currently  Other Topics Concern   Not on file  Social History Narrative   Not on file   Social Drivers of Health   Financial Resource Strain: Low Risk  (05/30/2023)   Overall Financial Resource Strain (CARDIA)    Difficulty of Paying Living Expenses: Not hard at all  Food Insecurity: No Food Insecurity (05/30/2023)   Hunger Vital Sign    Worried About Running Out of Food in the Last Year: Never true    Ran Out of Food in the Last Year: Never true  Transportation Needs: No Transportation Needs (05/30/2023)   PRAPARE - Administrator, Civil Service (Medical): No    Lack of Transportation (Non-Medical): No  Physical Activity: Unknown (05/30/2023)   Exercise Vital Sign    Days of Exercise per Week: 0 days    Minutes of Exercise per Session: Not on file  Stress: No Stress Concern Present (05/30/2023)   [employer] of Occupational Health - Occupational Stress Questionnaire    Feeling of Stress : Only a little  Social Connections: Socially Isolated (05/30/2023)   Social Connection and Isolation Panel [NHANES]    Frequency of Communication with Friends and Family: Never    Frequency of Social  Gatherings with Friends and Family: Never    Attends Religious Services: Never    Database administrator or Organizations: No    Attends Banker Meetings: Never    Marital Status: Widowed  Intimate Partner Violence: Not At Risk (05/23/2023)   Humiliation, Afraid, Rape, and Kick questionnaire    Fear of Current or Ex-Partner: No    Emotionally Abused: No    Physically Abused: No    Sexually Abused: No      Family History  Problem Relation Age of Onset   Heart disease Sister    Cancer Sister    Vitals:   06/30/23 1059 06/30/23  1100 06/30/23 1101  BP: 120/60    Pulse: 84 83 83  SpO2: (!) 88% (!) 89% 93%  Weight: 107 lb (48.5 kg)     Wt Readings from Last 3 Encounters:  06/30/23 107 lb (48.5 kg)  06/28/23 107 lb 3.2 oz (48.6 kg)  06/02/23 107 lb (48.5 kg)   Lab Results  Component Value Date   CREATININE 1.17 (H) 06/02/2023   CREATININE 0.91 05/26/2023   CREATININE 0.81 05/25/2023     PHYSICAL EXAM:  General: Well appearing. No resp difficulty HEENT: HOH Neck: supple, no JVD Cor: Irregular rhythm, normal rate. No rubs, gallops or murmurs Lungs: expiratory wheezing in upper lobes Abdomen: soft, nontender, nondistended. Extremities: no cyanosis, clubbing, rash, edema Neuro: alert & oriented X 3. Moves all 4 extremities w/o difficulty. Affect pleasant   ECG: not done   ASSESSMENT & PLAN:  1: NICM with reduced ejection fraction- - suspect due to atrial fibrillation - NYHA class II - euvolemic - weighing daily; home weight chart reviewed; reminded to call for overnight weight gain of >2 pounds or weekly weight gain of > 5 pounds - weight unchanged from last visit 1 month ago - Echo 05/23/23: EF 25-30%, RV normal, normal PA pressure, mild LAE, mild/ moderate MR - continue bisoprolol 10mg  BID - continue furosemide 20mg  daily - continue losartan 12.5mg  daily  - BP has improved but would be cautious in using MRA so as to not drop her BP too low -  discussed SLGT2 but patient has been prone to UTI's so will defer for now - BMET/ lipid panel today - BNP 05/22/23 reviewed and was 1759.2  2: HTN- - BP 120/60 - saw PCP Evelene Croon) 03/25 - BMET 06/02/23 reviewed and showed sodium 140, potassium 3.7, creatinine 1.17 & GFR 45 - BMET today  3: T2DM (managed by PCP)- - A1c 05/22/23 reviewed and was 6.5% - continue atorvastatin 40mg  daily - lipid panel today  4: Atrial fibrillation- - continue bisoprolol 10mg  BID - continue edoxaban 30mg  daily - dig was stopped due to dig level of 2.5 on 06/02/23 & visual issues resolved - sees cardiology (Agbor-Etang) 05/25  5: COPD- - has O2 at 2L at home that she can wear PRN; generally wears it daily for 20-30 minutes - pulse ox initially 88% after getting up to the scale but then rose to 93% - sees pulmonology Jayme Cloud) 03/25 - she says her shortness of breath improves after using inhaler   Return in 3 months, sooner if needed.   Delma Freeze, FNP 06/29/23

## 2023-06-29 NOTE — Telephone Encounter (Signed)
 Lvm to confirm appt for 06/30/23

## 2023-06-30 ENCOUNTER — Ambulatory Visit: Payer: 59 | Attending: Family | Admitting: Family

## 2023-06-30 ENCOUNTER — Encounter: Payer: Self-pay | Admitting: Family

## 2023-06-30 VITALS — BP 120/60 | HR 83 | Wt 107.0 lb

## 2023-06-30 DIAGNOSIS — I2510 Atherosclerotic heart disease of native coronary artery without angina pectoris: Secondary | ICD-10-CM | POA: Diagnosis not present

## 2023-06-30 DIAGNOSIS — I5022 Chronic systolic (congestive) heart failure: Secondary | ICD-10-CM | POA: Insufficient documentation

## 2023-06-30 DIAGNOSIS — E119 Type 2 diabetes mellitus without complications: Secondary | ICD-10-CM

## 2023-06-30 DIAGNOSIS — I130 Hypertensive heart and chronic kidney disease with heart failure and stage 1 through stage 4 chronic kidney disease, or unspecified chronic kidney disease: Secondary | ICD-10-CM | POA: Insufficient documentation

## 2023-06-30 DIAGNOSIS — Z87440 Personal history of urinary (tract) infections: Secondary | ICD-10-CM | POA: Diagnosis not present

## 2023-06-30 DIAGNOSIS — E1122 Type 2 diabetes mellitus with diabetic chronic kidney disease: Secondary | ICD-10-CM | POA: Insufficient documentation

## 2023-06-30 DIAGNOSIS — Z79899 Other long term (current) drug therapy: Secondary | ICD-10-CM | POA: Insufficient documentation

## 2023-06-30 DIAGNOSIS — J449 Chronic obstructive pulmonary disease, unspecified: Secondary | ICD-10-CM | POA: Insufficient documentation

## 2023-06-30 DIAGNOSIS — Z955 Presence of coronary angioplasty implant and graft: Secondary | ICD-10-CM | POA: Diagnosis not present

## 2023-06-30 DIAGNOSIS — I480 Paroxysmal atrial fibrillation: Secondary | ICD-10-CM

## 2023-06-30 DIAGNOSIS — I428 Other cardiomyopathies: Secondary | ICD-10-CM | POA: Diagnosis not present

## 2023-06-30 DIAGNOSIS — I10 Essential (primary) hypertension: Secondary | ICD-10-CM

## 2023-06-30 DIAGNOSIS — I4891 Unspecified atrial fibrillation: Secondary | ICD-10-CM | POA: Diagnosis not present

## 2023-06-30 DIAGNOSIS — Z7901 Long term (current) use of anticoagulants: Secondary | ICD-10-CM | POA: Insufficient documentation

## 2023-06-30 DIAGNOSIS — N182 Chronic kidney disease, stage 2 (mild): Secondary | ICD-10-CM | POA: Insufficient documentation

## 2023-06-30 MED ORDER — LOSARTAN POTASSIUM 25 MG PO TABS: 12.5000 mg | ORAL_TABLET | Freq: Every day | ORAL | 3 refills | Status: DC

## 2023-06-30 NOTE — Patient Instructions (Signed)
 Medication Changes:  No medication changes today! Refills of Losartan was sent to your pharmacy.  Lab Work:  Go DOWN to LOWER LEVEL (LL) to have your blood work completed inside of [employer] office.  We will only call you if the results are abnormal or if the provider would like to make medication changes.  Follow-Up in: Please follow up with the Advanced [facility] Failure Clinic.  At the [facility] Clinic, you and your health needs are our priority. We have a designated team specialized in the treatment of Heart Failure. This Care Team includes your primary Heart Failure Specialized Cardiologist (physician), Advanced Practice Providers (APPs- Physician Assistants and Nurse Practitioners), and Pharmacist who all work together to provide you with the care you need, when you need it.   You may see any of the following providers on your designated Care Team at your next follow up:  Dr. Arvilla Meres Dr. Marca Ancona Dr. Dorthula Nettles Dr. Theresia Bough Clarisa Kindred, NP Tonye Becket, NP Robbie Lis, [place] [facility] [place], [place] Brynda Peon, NP [place] Lee, NP Karle Plumber, PharmD   Please be sure to bring in all your medications bottles to every appointment.   Need to Contact us:  If you have any questions or concerns before your next appointment please send [place] a message through [place] or call our office at [phone].    TO LEAVE A MESSAGE FOR THE NURSE SELECT OPTION 2, PLEASE LEAVE A MESSAGE INCLUDING: YOUR NAME DATE OF BIRTH CALL BACK NUMBER REASON FOR CALL**this is important as we prioritize the call backs  YOU WILL RECEIVE A CALL BACK THE SAME DAY AS LONG AS YOU CALL BEFORE 4:00 PM

## 2023-07-01 LAB — BASIC METABOLIC PANEL
BUN/Creatinine Ratio: 14 (ref 12–28)
BUN: 14 mg/dL (ref 8–27)
CO2: 26 mmol/L (ref 20–29)
Calcium: 9.9 mg/dL (ref 8.7–10.3)
Chloride: 95 mmol/L — ABNORMAL LOW (ref 96–106)
Creatinine, Ser: 1.02 mg/dL — ABNORMAL HIGH (ref 0.57–1.00)
Glucose: 113 mg/dL — ABNORMAL HIGH (ref 70–99)
Potassium: 4.1 mmol/L (ref 3.5–5.2)
Sodium: 140 mmol/L (ref 134–144)
eGFR: 53 mL/min/{1.73_m2} — ABNORMAL LOW (ref >59)

## 2023-07-01 LAB — LIPID PANEL
Chol/HDL Ratio: 3.3 ratio (ref 0.0–4.4)
Cholesterol, Total: 151 mg/dL (ref 100–199)
HDL: 46 mg/dL (ref >39)
LDL Chol Calc (NIH): 86 mg/dL (ref 0–99)
Triglycerides: 102 mg/dL (ref 0–149)
VLDL Cholesterol Cal: 19 mg/dL (ref 5–40)

## 2023-07-03 ENCOUNTER — Encounter: Payer: Self-pay | Admitting: Family

## 2023-07-04 ENCOUNTER — Other Ambulatory Visit: Payer: Self-pay | Admitting: Family

## 2023-07-04 MED ORDER — LOSARTAN POTASSIUM 25 MG PO TABS: 12.5000 mg | ORAL_TABLET | Freq: Every day | ORAL | 3 refills | Status: DC

## 2023-07-04 MED ORDER — ATORVASTATIN CALCIUM 80 MG PO TABS: 80.0000 mg | ORAL_TABLET | Freq: Every day | ORAL | Status: DC

## 2023-07-06 ENCOUNTER — Encounter: Payer: Self-pay | Admitting: Pediatrics

## 2023-07-06 NOTE — Assessment & Plan Note (Signed)
 Poor sleep despite Trazodone 50mg  nightly. No improvement with current medication, suggesting it may not be the right choice. -Discontinue Trazodone. -Start Mirtazapine (Remeron) 15mg  nightly, beginning with half a tablet.

## 2023-07-06 NOTE — Assessment & Plan Note (Addendum)
 Dig toxicity, resolved and was evaluated by cards. Heart rate consistently in the 90s, with a reading of 110 during the visit. Currently managed with bisoprolol. On edoxaban. Asymptomatic. EKG as below. -EKG with Afib, not in RVR - has f/u with cardiology this week, sent FYI message.

## 2023-07-06 NOTE — Assessment & Plan Note (Signed)
 Following in HF clinic. Weight stable at home. Asymptomatic. Continue f/u with cardiology. Med rec completed.

## 2023-07-06 NOTE — Assessment & Plan Note (Signed)
 On trellegy upcoming visit with pulm.

## 2023-07-06 NOTE — Assessment & Plan Note (Signed)
 Doing well, requesting refills.

## 2023-07-11 ENCOUNTER — Encounter: Payer: Self-pay | Admitting: Family

## 2023-07-12 ENCOUNTER — Other Ambulatory Visit: Payer: Self-pay | Admitting: Family

## 2023-07-12 MED ORDER — ATORVASTATIN CALCIUM 40 MG PO TABS: 40.0000 mg | ORAL_TABLET | Freq: Two times a day (BID) | ORAL | 3 refills | Status: DC

## 2023-07-13 ENCOUNTER — Encounter: Payer: Self-pay | Admitting: Family

## 2023-07-13 ENCOUNTER — Ambulatory Visit (INDEPENDENT_AMBULATORY_CARE_PROVIDER_SITE_OTHER): Payer: 59 | Admitting: Pulmonary Disease

## 2023-07-13 ENCOUNTER — Encounter: Payer: Self-pay | Admitting: Pulmonary Disease

## 2023-07-13 VITALS — BP 110/58 | HR 103 | Temp 97.1°F | Ht 65.0 in | Wt 102.0 lb

## 2023-07-13 DIAGNOSIS — J3089 Other allergic rhinitis: Secondary | ICD-10-CM

## 2023-07-13 DIAGNOSIS — I4811 Longstanding persistent atrial fibrillation: Secondary | ICD-10-CM

## 2023-07-13 DIAGNOSIS — J4489 Other specified chronic obstructive pulmonary disease: Secondary | ICD-10-CM

## 2023-07-13 DIAGNOSIS — I5022 Chronic systolic (congestive) heart failure: Secondary | ICD-10-CM

## 2023-07-13 NOTE — Patient Instructions (Addendum)
 VISIT SUMMARY:  Today, we discussed your ongoing respiratory issues, including asthma with COPD overlap, congestive heart failure, atrial fibrillation, and allergic rhinitis. We reviewed your current medications and made some recommendations to help manage your symptoms more effectively.  YOUR PLAN:  -ASTHMA WITH COPD OVERLAP/CHRONIC ASTHMATIC BRONCHITIS: Asthma with COPD overlap means you have symptoms of both asthma and chronic obstructive pulmonary disease, which cause inflammation in your bronchial tubes. You should continue using your Trelegy inhaler daily and use your rescue inhaler with two puffs as needed.  -CONGESTIVE HEART FAILURE: Congestive heart failure means your heart is not pumping blood as well as it should, causing fluid buildup in your lungs and other parts of your body. You should continue using your home oxygen concentrator at night, use portable oxygen during extensive walking, and take bisoprolol and losartan as prescribed. Follow up with your cardiologist in May.  -ATRIAL FIBRILLATION: Atrial fibrillation is an irregular and often rapid heart rate that can increase your risk of strokes and other heart-related complications. You should continue taking bisoprolol as prescribed and regularly monitor your heart rate and blood pressure.  -ALLERGIC RHINITIS: Allergic rhinitis is an allergic reaction that causes sneezing, congestion, and a runny nose. You can use Claritin or Zyrtec as needed to manage your symptoms, and consider taking one tablet of Claritin daily if required.  INSTRUCTIONS:  Please follow up with your cardiologist in May for your congestive heart failure. Continue monitoring your heart rate and blood pressure regularly. Use your home oxygen concentrator at night and portable oxygen during extensive walking. If you have any new or worsening symptoms, contact our office immediately.

## 2023-07-13 NOTE — Progress Notes (Signed)
 Subjective:    Patient ID: Angelica Ferguson, female    DOB: October 27, 1932, 89 y.o.   MRN: [id number]  Patient Care Team: Jackolyn Confer, MD as PCP - General (Family Medicine) Salena Saner, MD as Consulting Physician (Pulmonary Disease)  Chief Complaint  Patient presents with   Consult    Has been diagnosed with COPD for years. DOE. No wheezing. Cough with off white sputum.     BACKGROUND: 89 year old woman, very remote former smoker   HPI Discussed the use of AI scribe software for clinical note transcription with the patient, who gave verbal consent to proceed.  History of Present Illness   Angelica Ferguson is an 89 year old female with COPD and asthma who presents for establishing care for management of the same. She is accompanied by her daughter, who is her primary caregiver.  She has a long-standing history of respiratory issues initially diagnosed as asthma, which was later reclassified as COPD approximately 10-12 years ago. She experiences flare-ups that have been managed with antibiotics and steroids both in [place] and after moving to the [place]. She has had three to four COPD flare-ups over the past three years.  In January, she experienced symptoms initially thought to be a COPD flare-up, but it was later identified as congestive heart failure.  Her LVEF was noted to be 25 to 30% at that time.  She was treated with Lasix, which helped alleviate her shortness of breath and pulmonary edema. Her medications were adjusted, and she was switched from Qvar and Bevespi to Trelegy, which she has been on for two to three weeks before the heart failure episode. She reports a reduction in coughing since starting Trelegy, although she still brings up sputum in the mornings and evenings.  She has a history of tuberculosis, which she was told has resulted in some lung scarring. She is on home oxygen therapy, using it primarily at night and occasionally during the day if she  experiences shortness of breath. She uses an [employer] portable oxygen concentrator when going out, although she has only needed it once in the past two to three weeks. She practices breathing exercises to manage her symptoms.  Her current medications for heart failure include bisoprolol 10 mg twice a day and losartan, which she takes half a tablet at night due to previous issues with low blood pressure. She was previously on digoxin but developed toxicity, leading to its discontinuation. Her heart rate is generally around 100-102 bpm, and she has a history of atrial fibrillation.  She has a rescue inhaler, which she uses less frequently now. Her daughter assists with her care, including monitoring her symptoms and medication use. She has a home oxygen concentrator set to two liters, which she uses at night to support her heart and respiratory functions.      Review of Systems A 10 point review of systems was performed and it is as noted above otherwise negative.   Past Medical History:  Diagnosis Date   Acute blood loss anemia 12/04/2020   Acute on chronic hypoxic respiratory failure (HCC) 05/24/2023   Acute pulmonary edema (HCC) 05/24/2023   Acute renal failure (HCC) 12/04/2020   Allergic rhinitis 02/20/2023   Atrial fibrillation (HCC)    CAD (coronary artery disease)    Cerebrovascular accident (HCC) 04/26/2015   CHF (congestive heart failure) (HCC)    Chronic kidney disease, stage 2 (mild) 03/30/2023   Closed fracture of right hip requiring operative repair, sequela 11/23/2020  COPD (chronic obstructive pulmonary disease) (HCC)    Coronary stent patent 06/19/2020   Diabetes mellitus without complication (HCC)    Former light tobacco smoker 06/22/2021   GERD (gastroesophageal reflux disease)    H/O TB (tuberculosis)    approx 1969.  Received treatment.   Hip fracture (HCC) 11/16/2020   History of CVA in adulthood 2016   TIA - no deficits   History of heart artery stent    approx  2012   Hyperlipidemia    Hypertension    Malnutrition of moderate degree 11/18/2020   Myocardial injury 05/24/2023   Normocytic anemia 11/03/2022   Pressure injury of skin 11/24/2020   Wears dentures    full upper and lower   Wears hearing aid in both ears     Past Surgical History:  Procedure Laterality Date   CATARACT EXTRACTION Left    approx 2019   CATARACT EXTRACTION W/PHACO Left 04/20/2023   Procedure: CATARACT EXTRACTION PHACO AND INTRAOCULAR LENS PLACEMENT (IOC) LEFT DIABETIC 23.62 01:55.0;  Surgeon: Estanislado Pandy, MD;  Location: [facility];  Service: Ophthalmology;  Laterality: Left;   CORONARY/GRAFT ACUTE MI REVASCULARIZATION     approx 2012   HIP ARTHROPLASTY Right 11/19/2020   Procedure: ARTHROPLASTY BIPOLAR HIP (HEMIARTHROPLASTY);  Surgeon: Juanell Fairly, MD;  Location: [facility];  Service: Orthopedics;  Laterality: Right;   KNEE SURGERY Left     Patient Active Problem List   Diagnosis Date Noted   BMI less than 19,adult 05/31/2023   Insomnia 05/31/2023   GERD (gastroesophageal reflux disease) 05/26/2023   CHF (congestive heart failure) (HCC) 05/24/2023   COPD (chronic obstructive pulmonary disease) (HCC) 05/24/2023   Atrial fibrillation (HCC) 05/24/2023   Diabetes mellitus type 2, diet-controlled (HCC) 05/24/2023   Iron deficiency anemia 05/24/2023   Dilated cardiomyopathy (HCC) 05/24/2023   Essential hypertension 04/16/2020    Family History  Problem Relation Age of Onset   Heart disease Sister    Cancer Sister     Social History   Tobacco Use   Smoking status: Former    Current packs/day: 0.00    Average packs/day: 0.5 packs/day for 15.0 years (7.5 ttl pk-yrs)    Types: Cigarettes    Start date: 25    Quit date: 1971    Years since quitting: 54.2   Smokeless tobacco: Never  Substance Use Topics   Alcohol use: Not Currently    Comment: Rare    Allergies  Allergen Reactions   Sulfa Antibiotics     Blood pressure  dropped    Current Meds  Medication Sig   albuterol (VENTOLIN HFA) 108 (90 Base) MCG/ACT inhaler Inhale 1 puff into the lungs every 6 (six) hours as needed for wheezing or shortness of breath.   atorvastatin (LIPITOR) 40 MG tablet Take 1 tablet (40 mg total) by mouth 2 (two) times daily.   benzonatate (TESSALON) 200 MG capsule Take 200 mg by mouth 3 (three) times daily as needed.   bisoprolol (ZEBETA) 10 MG tablet Take 1 tablet (10 mg total) by mouth 2 (two) times daily.   edoxaban (SAVAYSA) 30 MG TABS tablet Take 1 tablet (30 mg total) by mouth daily.   Fluticasone-Umeclidin-Vilant (TRELEGY ELLIPTA) 100-62.5-25 MCG/ACT AEPB Inhale 1 puff into the lungs daily.   furosemide (LASIX) 20 MG tablet Take 1 tablet (20 mg total) by mouth daily.   losartan (COZAAR) 25 MG tablet Take 0.5 tablets (12.5 mg total) by mouth at bedtime.   mirtazapine (REMERON SOL-TAB) 15 MG disintegrating  tablet Take 1 tablet (15 mg total) by mouth at bedtime.   pantoprazole (PROTONIX) 40 MG tablet Take 1 tablet (40 mg total) by mouth daily.    Immunization History  Administered Date(s) Administered   Hepatitis B, ADULT 03/14/2022   Influenza Inj Mdck Quad Pf 12/29/2020   Influenza, High Dose Seasonal PF 12/26/2022   PFIZER Comirnaty(Gray Top)Covid-19 Tri-Sucrose Vaccine 06/29/2020   Pfizer Covid-19 Vaccine Bivalent Booster 47yrs & up 01/12/2021   Pneumococcal Polysaccharide-23 03/29/2022   Td 08/28/2020   Tdap 03/14/2022   Varicella 03/14/2022        Objective:     BP (!) 110/58 (BP Location: Right Arm, Cuff Size: Normal)   Pulse (!) 103   Temp (!) 97.1 F (36.2 C)   Ht 5\' 5"  (1.651 m)   Wt 102 lb (46.3 kg) Comment: per patient in a wheelchair today  SpO2 90%   BMI 16.97 kg/m   SpO2: 90 % O2 Device: None (Room air)  GENERAL: Elderly, frail woman, in no acute distress presents in transport chair.  No conversational dyspnea. HEAD: Normocephalic, atraumatic.  EYES: Pupils equal, round, reactive to  light.  No scleral icterus.  MOUTH: Upper and lower dentures, oral mucosa moist.  No thrush. NECK: Supple. No thyromegaly. Trachea midline. No JVD.  No adenopathy. PULMONARY: Good air entry bilaterally.  Coarse, otherwise, no adventitious sounds. CARDIOVASCULAR: S1 and S2. Regular rate and rhythm.  ABDOMEN: Benign. MUSCULOSKELETAL: No joint deformity, no clubbing, trace edema.  NEUROLOGIC: Debilitated, presents in transport chair, no overt focal deficit SKIN: Intact,warm,dry. PSYCH: Mood and behavior normal.  Attempted ambulatory oximetry however the patient performed due to unsteady gait.    Assessment & Plan:     ICD-10-CM   1. Chronic asthmatic bronchitis (HCC)  J44.89     2. Chronic systolic congestive heart failure (HCC)  I50.22     3. Longstanding persistent atrial fibrillation (HCC)  I48.11     4. Perennial allergic rhinitis  J30.89      Assessment and Plan    Asthma with COPD overlap/chronic asthmatic bronchitis [facility] presents with asthma with COPD overlap, characterized by inflamed bronchial tubes without emphysema. She has experienced multiple exacerbations managed with antibiotics and steroids. Recently transitioned from Qvar and Bevespi to Trelegy, she reports symptomatic improvement, particularly reduced coughing, due to Trelegy's dual indication for asthma and COPD. - Continue Trelegy inhaler - Use rescue inhaler with two puffs as needed  Congestive Heart Failure Diagnosed with congestive heart failure in January, initially suspected as a COPD exacerbation. Symptoms included peripheral edema and pulmonary congestion, managed with Lasix. Oxygen therapy is beneficial, particularly during sleep and extensive walking. Her heart rate and blood pressure are managed with bisoprolol and losartan following digoxin toxicity. Nighttime oxygen use is emphasized to ensure adequate tissue oxygenation due to reduced cardiac output. - Use home oxygen concentrator at  night - Use portable oxygen during extensive walking - Continue bisoprolol and losartan as prescribed - Follow up with cardiologist in May  Atrial Fibrillation Long-standing atrial fibrillation managed with bisoprolol. Heart rate is maintained around 100-102 bpm, and blood pressure has improved with medication adjustments. Monitoring is essential to prevent exacerbations. - Continue bisoprolol as prescribed - Monitor heart rate and blood pressure regularly  Allergic Rhinitis Experiences seasonal allergies exacerbating respiratory symptoms. Typically wears a mask outdoors to reduce exposure. Claritin or Zyrtec can be used as needed to manage symptoms without sedation or drug interactions. - Consider using Claritin or Zyrtec as needed - Use  one tablet of Claritin daily if required     Advised if symptoms do not improve or worsen, to please contact office for sooner follow up or seek emergency care.    I spent 60 minutes of dedicated to the care of this patient on the date of this encounter to include pre-visit review of records, face-to-face time with the patient discussing conditions above, post visit ordering of testing, clinical documentation with the electronic health record, making appropriate referrals as documented, and communicating necessary findings to members of the patients care team.   C. Danice Goltz, MD Advanced [facility]choscopy P[facility]    *This note was dictated using voice recognition software/Dragon.  Despite best efforts to proofread, errors can occur which can change the meaning. Any transcriptional errors that result from this process are unintentional and may not be fully corrected at the time of dictation.

## 2023-07-18 DIAGNOSIS — J9601 Acute respiratory failure with hypoxia: Secondary | ICD-10-CM

## 2023-07-18 DIAGNOSIS — I480 Paroxysmal atrial fibrillation: Secondary | ICD-10-CM

## 2023-07-18 DIAGNOSIS — N182 Chronic kidney disease, stage 2 (mild): Secondary | ICD-10-CM | POA: Diagnosis not present

## 2023-07-18 DIAGNOSIS — E1165 Type 2 diabetes mellitus with hyperglycemia: Secondary | ICD-10-CM

## 2023-07-18 DIAGNOSIS — J309 Allergic rhinitis, unspecified: Secondary | ICD-10-CM

## 2023-07-18 DIAGNOSIS — I5033 Acute on chronic diastolic (congestive) heart failure: Secondary | ICD-10-CM | POA: Diagnosis not present

## 2023-07-18 DIAGNOSIS — J441 Chronic obstructive pulmonary disease with (acute) exacerbation: Secondary | ICD-10-CM

## 2023-07-18 DIAGNOSIS — E440 Moderate protein-calorie malnutrition: Secondary | ICD-10-CM

## 2023-07-18 DIAGNOSIS — E1122 Type 2 diabetes mellitus with diabetic chronic kidney disease: Secondary | ICD-10-CM | POA: Diagnosis not present

## 2023-07-18 DIAGNOSIS — I2510 Atherosclerotic heart disease of native coronary artery without angina pectoris: Secondary | ICD-10-CM

## 2023-07-18 DIAGNOSIS — I130 Hypertensive heart and chronic kidney disease with heart failure and stage 1 through stage 4 chronic kidney disease, or unspecified chronic kidney disease: Secondary | ICD-10-CM | POA: Diagnosis not present

## 2023-07-18 DIAGNOSIS — D631 Anemia in chronic kidney disease: Secondary | ICD-10-CM

## 2023-07-24 ENCOUNTER — Encounter: Payer: Self-pay | Admitting: Pulmonary Disease

## 2023-08-01 LAB — HM DIABETES EYE EXAM

## 2023-08-04 ENCOUNTER — Ambulatory Visit (INDEPENDENT_AMBULATORY_CARE_PROVIDER_SITE_OTHER): Admitting: Pediatrics

## 2023-08-04 ENCOUNTER — Encounter: Payer: Self-pay | Admitting: Pediatrics

## 2023-08-04 VITALS — BP 123/77 | HR 100 | Temp 97.5°F | Wt 108.2 lb

## 2023-08-04 DIAGNOSIS — I420 Dilated cardiomyopathy: Secondary | ICD-10-CM

## 2023-08-04 DIAGNOSIS — G4700 Insomnia, unspecified: Secondary | ICD-10-CM | POA: Diagnosis not present

## 2023-08-04 DIAGNOSIS — J449 Chronic obstructive pulmonary disease, unspecified: Secondary | ICD-10-CM | POA: Diagnosis not present

## 2023-08-04 DIAGNOSIS — H6123 Impacted cerumen, bilateral: Secondary | ICD-10-CM | POA: Diagnosis not present

## 2023-08-04 DIAGNOSIS — S01512S Laceration without foreign body of oral cavity, sequela: Secondary | ICD-10-CM

## 2023-08-04 DIAGNOSIS — I5022 Chronic systolic (congestive) heart failure: Secondary | ICD-10-CM | POA: Diagnosis not present

## 2023-08-04 DIAGNOSIS — E119 Type 2 diabetes mellitus without complications: Secondary | ICD-10-CM

## 2023-08-04 DIAGNOSIS — I480 Paroxysmal atrial fibrillation: Secondary | ICD-10-CM

## 2023-08-04 MED ORDER — PREDNISONE 10 MG PO TABS: ORAL_TABLET | ORAL | 3 refills | Status: DC

## 2023-08-04 MED ORDER — MIRTAZAPINE 30 MG PO TBDP: 30.0000 mg | ORAL_TABLET | Freq: Every day | ORAL | 2 refills | Status: DC

## 2023-08-04 MED ORDER — EDOXABAN TOSYLATE 30 MG PO TABS: 30.0000 mg | ORAL_TABLET | Freq: Every day | ORAL | 0 refills | Status: DC

## 2023-08-04 MED ORDER — CHLORHEXIDINE GLUCONATE 0.12 % MT SOLN: 15.0000 mL | Freq: Two times a day (BID) | OROMUCOSAL | 0 refills | Status: DC

## 2023-08-04 MED ORDER — DOXYCYCLINE HYCLATE 100 MG PO TABS: ORAL_TABLET | ORAL | 3 refills | Status: DC

## 2023-08-04 NOTE — Progress Notes (Signed)
 Office Visit  BP 123/77   Pulse 100   Temp (!) 97.5 F (36.4 C) (Oral)   Wt 108 lb 3.2 oz (49.1 kg)   SpO2 90%   BMI 18.01 kg/m    Subjective:    Patient ID: Angelica Ferguson, female    DOB: [birth year], 89 y.o.   MRN: [id number]  HPI: Angelica Ferguson is a 89 y.o. female  Chief Complaint  Patient presents with   Congestive Heart Failure   Otitis Media   Discussed the use of AI scribe software for clinical note transcription with the patient, who gave verbal consent to proceed.  History of Present Illness   Angelica Ferguson is an 89 year old female who presents for follow-up on her heart rate management. She is accompanied by her daughter, who is her primary caregiver.  She has been experiencing elevated heart rates, with recent measurements consistently around 120-125 beats per minute, despite previous recommendations to keep her heart rate below 110. This increase occurred about one to two weeks ago, coinciding with symptoms suggestive of an infection, possibly related to pollen exposure. She was treated with antibiotics and steroids, which helped reduce her heart rate, although it still occasionally spikes to 120. Her heart rate is now generally between 98 and 110 beats per minute. She monitors her heart rate using both a blood pressure cuff and a pulse oximeter, noting that the pulse oximeter tends to show lower readings. She has been using doxycycline 100 mg and a steroid at 5 mg per day during episodes of infection, which she administers for five days. Her daughter notes that the steroid dose may not be very effective at this level.  She has been experiencing difficulty sleeping and is currently on mirtazapine 15 mg, which has improved her sleep compared to trazodone, but she still reports insufficient sleep.  She has a history of earwax buildup, which has caused earaches and impacted her hearing aid use. Both ears are currently blocked with wax, and she has previously tolerated  ear lavage.  She has experienced a painful tongue lesion, which was treated with salt rinses. The lesion is still present and causes occasional pain.  She uses oxygen at night as recommended by a lung specialist to support her heart function, although she finds it uncomfortable. Her daughter does not monitor her oxygen levels while she sleeps.  She is able to walk into shops without using a wheelchair, although she carries portable oxygen in the car for occasional use. She and her daughter enjoy going out for lunches and are planning a trip to [facility] next month.     Wt Readings from Last 3 Encounters:  08/04/23 108 lb 3.2 oz (49.1 kg)  07/13/23 102 lb (46.3 kg)  06/30/23 107 lb (48.5 kg)    Relevant past medical, surgical, family and social history reviewed and updated as indicated. Interim medical history since our last visit reviewed. Allergies and medications reviewed and updated.  ROS per HPI unless specifically indicated above     Objective:    BP 123/77   Pulse 100   Temp (!) 97.5 F (36.4 C) (Oral)   Wt 108 lb 3.2 oz (49.1 kg)   SpO2 90%   BMI 18.01 kg/m   Wt Readings from Last 3 Encounters:  08/04/23 108 lb 3.2 oz (49.1 kg)  07/13/23 102 lb (46.3 kg)  06/30/23 107 lb (48.5 kg)     Physical Exam Constitutional:      Appearance: Normal appearance.  HENT:  Head: Normocephalic and atraumatic.     Right Ear: Decreased hearing noted. There is impacted cerumen.     Left Ear: Decreased hearing noted. There is impacted cerumen.  Eyes:     Pupils: Pupils are equal, round, and reactive to light.  Cardiovascular:     Rate and Rhythm: Normal rate and regular rhythm.     Pulses: Normal pulses.     Heart sounds: Normal heart sounds.  Pulmonary:     Effort: Pulmonary effort is normal.     Breath sounds: Normal breath sounds.  Musculoskeletal:        General: Normal range of motion.     Cervical back: Normal range of motion.  Skin:    General: Skin is warm and  dry.     Capillary Refill: Capillary refill takes less than 2 seconds.  Neurological:     General: No focal deficit present.     Mental Status: She is alert. Mental status is at baseline.  Psychiatric:        Mood and Affect: Mood normal.        Behavior: Behavior normal.         08/04/2023   10:31 AM 06/28/2023   11:08 AM 05/05/2023   12:39 PM  Depression screen PHQ 2/9  Decreased Interest 0 0 0  Down, Depressed, Hopeless 0 0 0  PHQ - 2 Score 0 0 0  Altered sleeping 2 3 1   Tired, decreased energy 1 1 0  Change in appetite 0 0 0  Feeling bad or failure about yourself  0 0 0  Trouble concentrating 0 0 0  Moving slowly or fidgety/restless 0 0 0  Suicidal thoughts 0 0   PHQ-9 Score 3 4 1   Difficult doing work/chores Not difficult at all Somewhat difficult Not difficult at all       08/04/2023   10:32 AM 06/28/2023   11:08 AM 05/05/2023   12:40 PM  GAD 7 : Generalized Anxiety Score  Nervous, Anxious, on Edge 0 0 0  Control/stop worrying 0 0 0  Worry too much - different things 0 0 0  Trouble relaxing 0 0 0  Restless 0 0 0  Easily annoyed or irritable 0 0 0  Afraid - awful might happen 0 0 0  Total GAD 7 Score 0 0 0  Anxiety Difficulty Not difficult at all  Not difficult at all       Assessment & Plan:  Assessment & Plan   Chronic obstructive pulmonary disease, unspecified COPD type (HCC) Assessment & Plan: Recommended for night time oxygen but intermittently able to tolerate. She is doing well overall. Recently recovered from presumed COPD exacerbation which daughter manages on her own based on COPD flare sx with doxy and pred. Refills sent as dosed with prior physician. Oxygen 88-90% on RA today in clinic, lungs clear.  Orders: -     Doxycycline Hyclate; Use for twice daily for 5 days if COPD flare  Dispense: 10 tablet; Refill: 3 -     predniSONE; Use one pill daily for 5 days if COPD flare  Dispense: 5 tablet; Refill: 3  Insomnia, unspecified type Assessment &  Plan: Current mirtazapine dose insufficient for sleep. Increasing dose may improve sleep quality. - Increase mirtazapine dose to 30 mg to improve sleep quality. - Schedule a video follow-up in 4 weeks to assess the effectiveness of the increased mirtazapine dose.  Orders: -     Mirtazapine; Take 1 tablet (30 mg total)  by mouth at bedtime.  Dispense: 30 tablet; Refill: 2  Diabetes mellitus type 2, diet-controlled (HCC) Assessment & Plan: Diet controlled. CTM. Due for foot and eye exam this year.   Chronic systolic congestive heart failure [facility]) Assessment & Plan: Following with cardiology. Asymptomatic. CTM.   Dilated cardiomyopathy [facility]) Assessment & Plan: Following with cardiology. Asymptomatic. CTM.   Paroxysmal atrial fibrillation (HCC) Assessment & Plan: Intermittent tachycardia with heart rate spikes managed below 110 bpm. On BP cuff reads 120s. On my coutn and pulse ox she remains in high 90s which suspect is more accurate. Will message cardiology as FYI and recommended ongoing management below HR 110 sustained. Exam reassuring.  Orders: -     Edoxaban Tosylate; Take 1 tablet (30 mg total) by mouth daily.  Dispense: 30 tablet; Refill: 0  Laceration of tongue, sequela Persistent tongue irritation after recent tongue laceration unresponsive to salt rinses. Chlorhexidine wash may aid healing. - Prescribe chlorhexidine wash for antimicrobial treatment to aid in healing of the tongue irritation. -     Chlorhexidine Gluconate; Use as directed 15 mLs in the mouth or throat 2 (two) times daily.  Dispense: 120 mL; Refill: 0  Bilateral impacted cerumen Cerumen blockage in both ears causing discomfort and potential hearing aid interference. - Perform ear lavage to remove cerumen from both ears. - Recommend over-the-counter Debrox drops for maintenance, to be used a couple of times a year. -     Ear Lavage  Follow up plan: Already has 2 week follow up scheduled.  Jackolyn Confer,  MD

## 2023-08-04 NOTE — Assessment & Plan Note (Signed)
 Following with cardiology. Asymptomatic. CTM.

## 2023-08-04 NOTE — Assessment & Plan Note (Signed)
 Diet controlled. CTM. Due for foot and eye exam this year.

## 2023-08-04 NOTE — Patient Instructions (Signed)
 Debrox for ears

## 2023-08-04 NOTE — Assessment & Plan Note (Signed)
 Recommended for night time oxygen but intermittently able to tolerate. She is doing well overall. Recently recovered from presumed COPD exacerbation which daughter manages on her own based on COPD flare sx with doxy and pred. Refills sent as dosed with prior physician. Oxygen 88-90% on RA today in clinic, lungs clear.

## 2023-08-04 NOTE — Assessment & Plan Note (Signed)
 Intermittent tachycardia with heart rate spikes managed below 110 bpm. On BP cuff reads 120s. On my coutn and pulse ox she remains in high 90s which suspect is more accurate. Will message cardiology as FYI and recommended ongoing management below HR 110 sustained. Exam reassuring.

## 2023-08-04 NOTE — Assessment & Plan Note (Signed)
 Current mirtazapine dose insufficient for sleep. Increasing dose may improve sleep quality. - Increase mirtazapine dose to 30 mg to improve sleep quality. - Schedule a video follow-up in 4 weeks to assess the effectiveness of the increased mirtazapine dose.

## 2023-08-08 ENCOUNTER — Encounter: Payer: Self-pay | Admitting: Family

## 2023-08-18 ENCOUNTER — Ambulatory Visit: Payer: Self-pay | Admitting: Pediatrics

## 2023-08-21 ENCOUNTER — Telehealth: Payer: Self-pay | Admitting: Family

## 2023-08-21 NOTE — Telephone Encounter (Signed)
 Called to confirm/remind patient of their appointment at [facility] on 08/22/23.   Appointment:   [x] Confirmed  [] Left mess   [] No answer/No voice mail  [] VM Full/unable to leave message  [] Phone not in service  Patient reminded to bring all medications and/or complete list.  Confirmed patient has transportation. Gave directions, instructed to utilize valet parking.

## 2023-08-21 NOTE — Progress Notes (Unsigned)
 Advanced [facility] Clinic Note    PCP: Hadassah Letters, MD (last seen 04/25) Cardiologist: [facility] during recent admission (has upcoming appt 08/24/23)  Chief Complaint: shortness of breath  HPI:  Ms Trottier is a 89 y/o female with a history of COPD, atrial fibrillation, CAD s/p stenting, HTN, IDA, T2DM, GERD, HOH and chronic heart failure.   Admitted 05/22/23 due to cough, wheezing and shortness of breath for ~  1 week PTA. CXR showed pulmonary congestion. CTA negative for PE but bilateral multifocal infiltrates. Was in AF RVR.  IV cardizem  given and drip started. IV lasix  provided and then transitioned to oral diuretics. Cardiology consulted. Echocardiogram showed an EF of 25 to 30%. Cardizem  then stopped due to low EF and digoxin  started. Home oxygen @ 2L ordered. IV solu-medrol  given for COPD exacerbation.   Echo 05/23/23: EF 25-30%, RV normal, normal PA pressure, mild LAE, mild/ moderate MR  She presents today, with her daughter, for a HF visit with a chief complaint of  shortness of breath with little exertion. Daughter says that this has been worsening over the last couple of weeks. She now has to sleep in the recliner because laying flat in the bed makes her SOB. She has associated cough, fatigue, palpitations, weight gain, intermittent confusion, dizziness and worsening pedal edema along with this. Denies chest pain or abdominal distention. Due to symptoms, daughter has increased patient's oxygen to 3L around the clock and having her use her inhaler more frequently. She did arrive without her oxygen on and room air sat was 69%, 3L oxygen applied and she quickly rose to 91%.   HR at home has been ranging from low 100's - 140's. Was previously on diltiazem  for afib but it was stopped due to reduction of EF. Was on digoxin  but developed toxicity with visual changes resulting in stoppage of digoxin  with resolution of visual changes.   ROS: All systems negative except what is listed in HPI,  PMH and Problem List   Past Medical History:  Diagnosis Date   Acute blood loss anemia 12/04/2020   Acute on chronic hypoxic respiratory failure (HCC) 05/24/2023   Acute pulmonary edema (HCC) 05/24/2023   Acute renal failure (HCC) 12/04/2020   Allergic rhinitis 02/20/2023   Atrial fibrillation (HCC)    CAD (coronary artery disease)    Cerebrovascular accident (HCC) 04/26/2015   CHF (congestive heart failure) (HCC)    Chronic kidney disease, stage 2 (mild) 03/30/2023   Closed fracture of right hip requiring operative repair, sequela 11/23/2020   COPD (chronic obstructive pulmonary disease) (HCC)    Coronary stent patent 06/19/2020   Diabetes mellitus without complication (HCC)    Former light tobacco smoker 06/22/2021   GERD (gastroesophageal reflux disease)    H/O TB (tuberculosis)    approx 1969.  Received treatment.   Hip fracture (HCC) 11/16/2020   History of CVA in adulthood 2016   TIA - no deficits   History of heart artery stent    approx 2012   Hyperlipidemia    Hypertension    Malnutrition of moderate degree 11/18/2020   Myocardial injury 05/24/2023   Normocytic anemia 11/03/2022   Pressure injury of skin 11/24/2020   Wears dentures    full upper and lower   Wears hearing aid in both ears     Current Outpatient Medications  Medication Sig Dispense Refill   albuterol  (VENTOLIN  HFA) 108 (90 Base) MCG/ACT inhaler Inhale 1 puff into the lungs every 6 (six) hours as needed  for wheezing or shortness of breath.     atorvastatin  (LIPITOR ) 40 MG tablet Take 1 tablet (40 mg total) by mouth 2 (two) times daily. 180 tablet 3   benzonatate  (TESSALON ) 200 MG capsule Take 200 mg by mouth 3 (three) times daily as needed.     bisoprolol  (ZEBETA ) 10 MG tablet Take 1 tablet (10 mg total) by mouth 2 (two) times daily. 180 tablet 3   chlorhexidine  (PERIDEX ) 0.12 % solution Use as directed 15 mLs in the mouth or throat 2 (two) times daily. 120 mL 0   doxycycline  (VIBRA -TABS) 100 MG  tablet Use for twice daily for 5 days if COPD flare 10 tablet 3   edoxaban  (SAVAYSA ) 30 MG TABS tablet Take 1 tablet (30 mg total) by mouth daily. 30 tablet 0   Fluticasone -Umeclidin-Vilant (TRELEGY ELLIPTA ) 100-62.5-25 MCG/ACT AEPB Inhale 1 puff into the lungs daily. 1 each 11   furosemide  (LASIX ) 20 MG tablet Take 1 tablet (20 mg total) by mouth daily. 90 tablet 3   losartan  (COZAAR ) 25 MG tablet Take 0.5 tablets (12.5 mg total) by mouth at bedtime. 45 tablet 3   mirtazapine  (REMERON  SOL-TAB) 30 MG disintegrating tablet Take 1 tablet (30 mg total) by mouth at bedtime. 30 tablet 2   pantoprazole  (PROTONIX ) 40 MG tablet Take 1 tablet (40 mg total) by mouth daily. 90 tablet 3   predniSONE  (DELTASONE ) 10 MG tablet Use one pill daily for 5 days if COPD flare 5 tablet 3   No current facility-administered medications for this visit.    Allergies  Allergen Reactions   Sulfa Antibiotics     Blood pressure dropped      Social History   Socioeconomic History   Marital status: Widowed    Spouse name: Not on file   Number of children: Not on file   Years of education: Not on file   Highest education level: Not on file  Occupational History   Not on file  Tobacco Use   Smoking status: Former    Current packs/day: 0.00    Average packs/day: 0.5 packs/day for 15.0 years (7.5 ttl pk-yrs)    Types: Cigarettes    Start date: 10    Quit date: 15    Years since quitting: 54.3   Smokeless tobacco: Never  Vaping Use   Vaping status: Never Used  Substance and Sexual Activity   Alcohol use: Not Currently    Comment: Rare   Drug use: Never   Sexual activity: Not Currently  Other Topics Concern   Not on file  Social History Narrative   Not on file   Social Drivers of Health   Financial Resource Strain: Low Risk  (05/30/2023)   Overall Financial Resource Strain (CARDIA)    Difficulty of Paying Living Expenses: Not hard at all  Food Insecurity: No Food Insecurity (05/30/2023)   Hunger  Vital Sign    Worried About Running Out of Food in the Last Year: Never true    Ran Out of Food in the Last Year: Never true  Transportation Needs: No Transportation Needs (05/30/2023)   PRAPARE - Administrator, Civil Service (Medical): No    Lack of Transportation (Non-Medical): No  Physical Activity: Unknown (05/30/2023)   Exercise Vital Sign    Days of Exercise per Week: 0 days    Minutes of Exercise per Session: Not on file  Stress: No Stress Concern Present (05/30/2023)   [employer] of Occupational Health - Occupational Stress Questionnaire  Feeling of Stress : Only a little  Social Connections: Socially Isolated (05/30/2023)   Social Connection and Isolation Panel [NHANES]    Frequency of Communication with Friends and Family: Never    Frequency of Social Gatherings with Friends and Family: Never    Attends Religious Services: Never    Database administrator or Organizations: No    Attends Banker Meetings: Never    Marital Status: Widowed  Intimate Partner Violence: Not At Risk (05/23/2023)   Humiliation, Afraid, Rape, and Kick questionnaire    Fear of Current or Ex-Partner: No    Emotionally Abused: No    Physically Abused: No    Sexually Abused: No      Family History  Problem Relation Age of Onset   Heart disease Sister    Cancer Sister     Vitals:   08/22/23 1428 08/22/23 1429 08/22/23 1500  BP: (!) 80/58  90/67  Pulse: (!) 110 (!) 110   SpO2: (!) 69% 91%   Weight: 113 lb (51.3 kg)     Wt Readings from Last 3 Encounters:  08/22/23 113 lb (51.3 kg)  08/04/23 108 lb 3.2 oz (49.1 kg)  07/13/23 102 lb (46.3 kg)   Lab Results  Component Value Date   CREATININE 1.02 (H) 06/30/2023   CREATININE 1.17 (H) 06/02/2023   CREATININE 0.91 05/26/2023     PHYSICAL EXAM:  General: Frail in wheelchair. No resp difficulty HEENT: normal Neck: supple, elevated JVD Cor: Irregular rhythm, tachycardic. No rubs, gallops or murmurs Lungs:  scattered expiratory wheezing Abdomen: soft, nontender, nondistended. Extremities: no cyanosis, clubbing, rash, 2+ pitting edema right lower leg, 1+ pitting left lower leg; lower extremities are cool to the touch Neuro: alert & oriented X 3. Moves all 4 extremities w/o difficulty. 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- - suspect due to uncontrolled atrial fibrillation - NYHA class III - fluid up with worsening symptoms, increased weight and swelling; lower extremities are cool to touch - weight up 6 pounds from last visit 6 weeks ago - unable to safely diurese outpatient due to hypotension - will send to ED due to hypotension, AF RVR, fluid retention & cool extremities - Echo 05/23/23: EF 25-30%, RV normal, normal PA pressure, mild LAE, mild/ moderate MR - BNP 05/22/23 reviewed and was 1759.2  2: HTN- - BP 80/58 with recheck 90/67 - saw PCP Juliette Oh) 04/25 - BMET 06/30/23 reviewed: sodium 140, potassium 4.1, creatinine 1.02 & GFR 53  3: Atrial fibrillation- - EKG today is AF RVR with HR 124 - dig was previously stopped due to dig level of 2.5 on 06/02/23 & visual issues resolved - unable to use diltiazem  due to low EF - sees cardiology (Agbor-Etang) 05/25  4: T2DM (managed by PCP)- - A1c 05/22/23 reviewed and was 6.5% - continue atorvastatin  40mg  daily - lipid panel 06/30/23: LDL 86  5: COPD- - has been wearing her oxygen at 3L around the clock although arrived without it and RA sat was 69%; oxygen applied and quickly rose to 91% - saw pulmonology Viva Grise) 03/25 - using her inhaler more often but without relief   Due to hypotension, fluid retention, cool extremities and uncontrolled AF RVR, will send patient to ED. Called and spoke with Loris Ros in the ED to given update of patient's condition. Patient was pushed in a wheelchair accompanied by a volunteer and patient's  daughter.   F/U pending ED  disposition.    Charlette Console, FNP 08/21/23

## 2023-08-22 ENCOUNTER — Ambulatory Visit (HOSPITAL_BASED_OUTPATIENT_CLINIC_OR_DEPARTMENT_OTHER): Admitting: Family

## 2023-08-22 ENCOUNTER — Other Ambulatory Visit: Payer: Self-pay

## 2023-08-22 ENCOUNTER — Encounter: Payer: Self-pay | Admitting: Family

## 2023-08-22 ENCOUNTER — Inpatient Hospital Stay
Admission: EM | Admit: 2023-08-22 | Discharge: 2023-08-24 | DRG: 871 | Disposition: E | Source: Ambulatory Visit | Attending: Internal Medicine | Admitting: Internal Medicine

## 2023-08-22 ENCOUNTER — Encounter: Payer: Self-pay | Admitting: Internal Medicine

## 2023-08-22 ENCOUNTER — Emergency Department

## 2023-08-22 VITALS — BP 90/67 | HR 110 | Wt 113.0 lb

## 2023-08-22 DIAGNOSIS — I110 Hypertensive heart disease with heart failure: Secondary | ICD-10-CM | POA: Insufficient documentation

## 2023-08-22 DIAGNOSIS — I130 Hypertensive heart and chronic kidney disease with heart failure and stage 1 through stage 4 chronic kidney disease, or unspecified chronic kidney disease: Secondary | ICD-10-CM | POA: Diagnosis present

## 2023-08-22 DIAGNOSIS — N179 Acute kidney failure, unspecified: Secondary | ICD-10-CM | POA: Diagnosis present

## 2023-08-22 DIAGNOSIS — I5022 Chronic systolic (congestive) heart failure: Secondary | ICD-10-CM

## 2023-08-22 DIAGNOSIS — Z955 Presence of coronary angioplasty implant and graft: Secondary | ICD-10-CM | POA: Insufficient documentation

## 2023-08-22 DIAGNOSIS — Z8249 Family history of ischemic heart disease and other diseases of the circulatory system: Secondary | ICD-10-CM

## 2023-08-22 DIAGNOSIS — D509 Iron deficiency anemia, unspecified: Secondary | ICD-10-CM | POA: Insufficient documentation

## 2023-08-22 DIAGNOSIS — Z515 Encounter for palliative care: Secondary | ICD-10-CM | POA: Diagnosis not present

## 2023-08-22 DIAGNOSIS — Z974 Presence of external hearing-aid: Secondary | ICD-10-CM

## 2023-08-22 DIAGNOSIS — N1831 Chronic kidney disease, stage 3a: Secondary | ICD-10-CM | POA: Diagnosis present

## 2023-08-22 DIAGNOSIS — Z8673 Personal history of transient ischemic attack (TIA), and cerebral infarction without residual deficits: Secondary | ICD-10-CM

## 2023-08-22 DIAGNOSIS — J449 Chronic obstructive pulmonary disease, unspecified: Secondary | ICD-10-CM | POA: Insufficient documentation

## 2023-08-22 DIAGNOSIS — I428 Other cardiomyopathies: Secondary | ICD-10-CM | POA: Insufficient documentation

## 2023-08-22 DIAGNOSIS — J441 Chronic obstructive pulmonary disease with (acute) exacerbation: Secondary | ICD-10-CM | POA: Diagnosis present

## 2023-08-22 DIAGNOSIS — H9190 Unspecified hearing loss, unspecified ear: Secondary | ICD-10-CM | POA: Insufficient documentation

## 2023-08-22 DIAGNOSIS — I509 Heart failure, unspecified: Principal | ICD-10-CM

## 2023-08-22 DIAGNOSIS — A419 Sepsis, unspecified organism: Principal | ICD-10-CM | POA: Diagnosis present

## 2023-08-22 DIAGNOSIS — D72829 Elevated white blood cell count, unspecified: Secondary | ICD-10-CM

## 2023-08-22 DIAGNOSIS — E1122 Type 2 diabetes mellitus with diabetic chronic kidney disease: Secondary | ICD-10-CM | POA: Diagnosis present

## 2023-08-22 DIAGNOSIS — E119 Type 2 diabetes mellitus without complications: Secondary | ICD-10-CM | POA: Insufficient documentation

## 2023-08-22 DIAGNOSIS — I10 Essential (primary) hypertension: Secondary | ICD-10-CM | POA: Diagnosis present

## 2023-08-22 DIAGNOSIS — Z9981 Dependence on supplemental oxygen: Secondary | ICD-10-CM

## 2023-08-22 DIAGNOSIS — Z8611 Personal history of tuberculosis: Secondary | ICD-10-CM

## 2023-08-22 DIAGNOSIS — I2510 Atherosclerotic heart disease of native coronary artery without angina pectoris: Secondary | ICD-10-CM | POA: Diagnosis present

## 2023-08-22 DIAGNOSIS — I4891 Unspecified atrial fibrillation: Secondary | ICD-10-CM | POA: Insufficient documentation

## 2023-08-22 DIAGNOSIS — K219 Gastro-esophageal reflux disease without esophagitis: Secondary | ICD-10-CM | POA: Diagnosis present

## 2023-08-22 DIAGNOSIS — R54 Age-related physical debility: Secondary | ICD-10-CM | POA: Diagnosis present

## 2023-08-22 DIAGNOSIS — I472 Ventricular tachycardia, unspecified: Secondary | ICD-10-CM | POA: Diagnosis present

## 2023-08-22 DIAGNOSIS — Z681 Body mass index (BMI) 19 or less, adult: Secondary | ICD-10-CM

## 2023-08-22 DIAGNOSIS — Z79899 Other long term (current) drug therapy: Secondary | ICD-10-CM

## 2023-08-22 DIAGNOSIS — Z66 Do not resuscitate: Secondary | ICD-10-CM | POA: Diagnosis not present

## 2023-08-22 DIAGNOSIS — Z87891 Personal history of nicotine dependence: Secondary | ICD-10-CM | POA: Insufficient documentation

## 2023-08-22 DIAGNOSIS — I469 Cardiac arrest, cause unspecified: Secondary | ICD-10-CM | POA: Diagnosis not present

## 2023-08-22 DIAGNOSIS — I959 Hypotension, unspecified: Secondary | ICD-10-CM | POA: Insufficient documentation

## 2023-08-22 DIAGNOSIS — I5023 Acute on chronic systolic (congestive) heart failure: Secondary | ICD-10-CM | POA: Diagnosis present

## 2023-08-22 DIAGNOSIS — I420 Dilated cardiomyopathy: Secondary | ICD-10-CM | POA: Diagnosis present

## 2023-08-22 DIAGNOSIS — I480 Paroxysmal atrial fibrillation: Secondary | ICD-10-CM

## 2023-08-22 DIAGNOSIS — R001 Bradycardia, unspecified: Secondary | ICD-10-CM | POA: Diagnosis present

## 2023-08-22 DIAGNOSIS — Z7984 Long term (current) use of oral hypoglycemic drugs: Secondary | ICD-10-CM

## 2023-08-22 DIAGNOSIS — J9811 Atelectasis: Secondary | ICD-10-CM | POA: Diagnosis present

## 2023-08-22 DIAGNOSIS — D649 Anemia, unspecified: Secondary | ICD-10-CM | POA: Diagnosis present

## 2023-08-22 DIAGNOSIS — E785 Hyperlipidemia, unspecified: Secondary | ICD-10-CM | POA: Diagnosis present

## 2023-08-22 DIAGNOSIS — I482 Chronic atrial fibrillation, unspecified: Principal | ICD-10-CM | POA: Diagnosis present

## 2023-08-22 DIAGNOSIS — J189 Pneumonia, unspecified organism: Secondary | ICD-10-CM | POA: Diagnosis present

## 2023-08-22 DIAGNOSIS — I462 Cardiac arrest due to underlying cardiac condition: Secondary | ICD-10-CM | POA: Diagnosis present

## 2023-08-22 DIAGNOSIS — Z96641 Presence of right artificial hip joint: Secondary | ICD-10-CM | POA: Diagnosis present

## 2023-08-22 DIAGNOSIS — J9601 Acute respiratory failure with hypoxia: Secondary | ICD-10-CM | POA: Diagnosis present

## 2023-08-22 DIAGNOSIS — R6520 Severe sepsis without septic shock: Secondary | ICD-10-CM | POA: Diagnosis present

## 2023-08-22 DIAGNOSIS — R002 Palpitations: Secondary | ICD-10-CM | POA: Diagnosis present

## 2023-08-22 DIAGNOSIS — R7989 Other specified abnormal findings of blood chemistry: Secondary | ICD-10-CM | POA: Diagnosis present

## 2023-08-22 DIAGNOSIS — J90 Pleural effusion, not elsewhere classified: Secondary | ICD-10-CM | POA: Diagnosis present

## 2023-08-22 DIAGNOSIS — I2489 Other forms of acute ischemic heart disease: Secondary | ICD-10-CM | POA: Diagnosis present

## 2023-08-22 HISTORY — DX: Unspecified asthma, uncomplicated: J45.909

## 2023-08-22 LAB — CBC
HCT: 32.6 % — ABNORMAL LOW (ref 36.0–46.0)
Hemoglobin: 9.4 g/dL — ABNORMAL LOW (ref 12.0–15.0)
MCH: 22.7 pg — ABNORMAL LOW (ref 26.0–34.0)
MCHC: 28.8 g/dL — ABNORMAL LOW (ref 30.0–36.0)
MCV: 78.7 fL — ABNORMAL LOW (ref 80.0–100.0)
Platelets: 427 10*3/uL — ABNORMAL HIGH (ref 150–400)
RBC: 4.14 MIL/uL (ref 3.87–5.11)
RDW: 18.7 % — ABNORMAL HIGH (ref 11.5–15.5)
WBC: 19.4 10*3/uL — ABNORMAL HIGH (ref 4.0–10.5)
nRBC: 0.5 % — ABNORMAL HIGH (ref 0.0–0.2)

## 2023-08-22 LAB — BASIC METABOLIC PANEL WITH GFR
Anion gap: 16 — ABNORMAL HIGH (ref 5–15)
BUN: 60 mg/dL — ABNORMAL HIGH (ref 8–23)
CO2: 28 mmol/L (ref 22–32)
Calcium: 9.3 mg/dL (ref 8.9–10.3)
Chloride: 94 mmol/L — ABNORMAL LOW (ref 98–111)
Creatinine, Ser: 2.1 mg/dL — ABNORMAL HIGH (ref 0.44–1.00)
GFR, Estimated: 22 mL/min — ABNORMAL LOW (ref >60)
Glucose, Bld: 169 mg/dL — ABNORMAL HIGH (ref 70–99)
Potassium: 5.2 mmol/L — ABNORMAL HIGH (ref 3.5–5.1)
Sodium: 138 mmol/L (ref 135–145)

## 2023-08-22 LAB — BRAIN NATRIURETIC PEPTIDE: B Natriuretic Peptide: 2345.8 pg/mL — ABNORMAL HIGH (ref 0.0–100.0)

## 2023-08-22 LAB — PROTIME-INR
INR: 5 (ref 0.8–1.2)
Prothrombin Time: 46.9 s — ABNORMAL HIGH (ref 11.4–15.2)

## 2023-08-22 LAB — TROPONIN I (HIGH SENSITIVITY)
Troponin I (High Sensitivity): 165 ng/L (ref <18)
Troponin I (High Sensitivity): 230 ng/L (ref <18)

## 2023-08-22 MED ORDER — ATORVASTATIN CALCIUM 20 MG PO TABS: 80.0000 mg | ORAL_TABLET | Freq: Every day | ORAL | Status: DC

## 2023-08-22 MED ORDER — ALBUTEROL SULFATE (2.5 MG/3ML) 0.083% IN NEBU: 2.5000 mg | INHALATION_SOLUTION | Freq: Four times a day (QID) | RESPIRATORY_TRACT | Status: DC | PRN

## 2023-08-22 MED ORDER — MORPHINE SULFATE (PF) 4 MG/ML IV SOLN
INTRAVENOUS | Status: AC
  Administered 2023-08-23: 4 mg via INTRAVENOUS
  Filled 2023-08-22: qty 1

## 2023-08-22 MED ORDER — TRAZODONE HCL 50 MG PO TABS: 25.0000 mg | ORAL_TABLET | Freq: Every evening | ORAL | Status: DC | PRN

## 2023-08-22 MED ORDER — MIRTAZAPINE 15 MG PO TBDP
30.0000 mg | ORAL_TABLET | Freq: Every day | ORAL | Status: DC
  Administered 2023-08-22: 30 mg via ORAL
  Filled 2023-08-22: qty 2

## 2023-08-22 MED ORDER — ONDANSETRON HCL 4 MG/2ML IJ SOLN: 4.0000 mg | Freq: Four times a day (QID) | INTRAMUSCULAR | Status: DC | PRN

## 2023-08-22 MED ORDER — ACETAMINOPHEN 650 MG RE SUPP: 650.0000 mg | Freq: Four times a day (QID) | RECTAL | Status: DC | PRN

## 2023-08-22 MED ORDER — METOPROLOL TARTRATE 5 MG/5ML IV SOLN
5.0000 mg | INTRAVENOUS | Status: DC | PRN
  Filled 2023-08-22: qty 5

## 2023-08-22 MED ORDER — FUROSEMIDE 10 MG/ML IJ SOLN
40.0000 mg | Freq: Once | INTRAMUSCULAR | Status: AC
  Administered 2023-08-22: 40 mg via INTRAVENOUS
  Filled 2023-08-22: qty 4

## 2023-08-22 MED ORDER — ACETAMINOPHEN 325 MG PO TABS: 650.0000 mg | ORAL_TABLET | Freq: Four times a day (QID) | ORAL | Status: DC | PRN

## 2023-08-22 MED ORDER — BENZONATATE 100 MG PO CAPS
200.0000 mg | ORAL_CAPSULE | Freq: Three times a day (TID) | ORAL | Status: DC | PRN
  Administered 2023-08-22: 200 mg via ORAL
  Filled 2023-08-22: qty 2

## 2023-08-22 MED ORDER — FUROSEMIDE 10 MG/ML IJ SOLN: 20.0000 mg | Freq: Two times a day (BID) | INTRAMUSCULAR | Status: DC

## 2023-08-22 MED ORDER — MORPHINE SULFATE (PF) 4 MG/ML IV SOLN: 4.0000 mg | Freq: Once | INTRAVENOUS | Status: AC

## 2023-08-22 MED ORDER — SODIUM CHLORIDE 0.9 % IV SOLN: INTRAVENOUS | Status: DC

## 2023-08-22 MED ORDER — LOSARTAN POTASSIUM 25 MG PO TABS: 12.5000 mg | ORAL_TABLET | Freq: Every day | ORAL | Status: DC

## 2023-08-22 MED ORDER — BISOPROLOL FUMARATE 5 MG PO TABS
10.0000 mg | ORAL_TABLET | Freq: Two times a day (BID) | ORAL | Status: DC
  Administered 2023-08-22: 10 mg via ORAL
  Filled 2023-08-22: qty 2

## 2023-08-22 MED ORDER — GLUCAGON HCL RDNA (DIAGNOSTIC) 1 MG IJ SOLR
INTRAMUSCULAR | Status: AC
  Filled 2023-08-22: qty 1

## 2023-08-22 MED ORDER — EDOXABAN TOSYLATE 30 MG PO TABS
30.0000 mg | ORAL_TABLET | Freq: Every day | ORAL | Status: DC
  Filled 2023-08-22: qty 1

## 2023-08-22 MED ORDER — CHLORHEXIDINE GLUCONATE 0.12 % MT SOLN
15.0000 mL | Freq: Two times a day (BID) | OROMUCOSAL | Status: DC
  Administered 2023-08-22: 15 mL via OROMUCOSAL
  Filled 2023-08-22: qty 15

## 2023-08-22 MED ORDER — BUDESON-GLYCOPYRROL-FORMOTEROL 160-9-4.8 MCG/ACT IN AERO
2.0000 | INHALATION_SPRAY | Freq: Two times a day (BID) | RESPIRATORY_TRACT | Status: DC
  Filled 2023-08-22: qty 5.9

## 2023-08-22 MED ORDER — PANTOPRAZOLE SODIUM 40 MG PO TBEC
40.0000 mg | DELAYED_RELEASE_TABLET | Freq: Every day | ORAL | Status: DC
Start: 2023-08-23 — End: 2023-08-23

## 2023-08-22 MED ORDER — ONDANSETRON HCL 4 MG PO TABS: 4.0000 mg | ORAL_TABLET | Freq: Four times a day (QID) | ORAL | Status: DC | PRN

## 2023-08-22 NOTE — Assessment & Plan Note (Signed)
 Home edoxaban  30 mg daily resumed on admission 08/05/2023

## 2023-08-22 NOTE — ED Provider Notes (Signed)
 [facility] Provider Note    Event Date/Time   First MD Initiated Contact with Patient 08/22/23 1629     (approximate)   History   Chief Complaint: Tachycardia and Shortness of Breath   HPI  Angelica Ferguson is a 89 y.o. female with a history of COPD, atrial fibrillation, hypertension, diabetes, CHF who was brought to the ED due to shortness of breath.  Daughter at bedside reports that patient has had slowly worsening dyspnea on exertion, orthopnea, and weight gain over the past 2 weeks despite compliance with medications including her diuretic.  No chest pain or fever.  Over the last 48 hours, symptoms have been severe, and patient is no longer able to get out of bed due to dyspnea and unsteadiness with standing.  Today she went to her doctor's office where she was found to have borderline blood pressure, atrial fibrillation with tachycardia, and volume overload and sent to the ED for further management.        Past Medical History:  Diagnosis Date   Acute blood loss anemia 12/04/2020   Acute on chronic hypoxic respiratory failure (HCC) 05/24/2023   Acute pulmonary edema (HCC) 05/24/2023   Acute renal failure (HCC) 12/04/2020   Allergic rhinitis 02/20/2023   Asthma    Atrial fibrillation (HCC)    CAD (coronary artery disease)    Cerebrovascular accident (HCC) 04/26/2015   CHF (congestive heart failure) (HCC)    Chronic kidney disease, stage 2 (mild) 03/30/2023   Closed fracture of right hip requiring operative repair, sequela 11/23/2020   COPD (chronic obstructive pulmonary disease) (HCC)    Coronary stent patent 06/19/2020   Diabetes mellitus without complication (HCC)    Former light tobacco smoker 06/22/2021   GERD (gastroesophageal reflux disease)    H/O TB (tuberculosis)    approx 1969.  Received treatment.   Hip fracture (HCC) 11/16/2020   History of CVA in adulthood 2016   TIA - no deficits   History of heart artery stent    approx  2012   Hyperlipidemia    Hypertension    Malnutrition of moderate degree 11/18/2020   Myocardial injury 05/24/2023   Normocytic anemia 11/03/2022   Pressure injury of skin 11/24/2020   Wears dentures    full upper and lower   Wears hearing aid in both ears     Current Outpatient Rx   Order #: [id number] Class: Historical Med   Order #: [id number] Class: Normal   Order #: [id number] Class: Historical Med   Order #: [id number] Class: Normal   Order #: [id number] Class: Normal   Order #: [id number] Class: Normal   Order #: [id number] Class: Normal   Order #: [id number] Class: Normal   Order #: [id number] Class: Normal   Order #: [id number] Class: Normal   Order #: [id number] Class: Normal   Order #: [id number] Class: Normal   Order #: [id number] Class: Normal    Past Surgical History:  Procedure Laterality Date   CATARACT EXTRACTION Left    approx 2019   CATARACT EXTRACTION W/PHACO Left 04/20/2023   Procedure: CATARACT EXTRACTION PHACO AND INTRAOCULAR LENS PLACEMENT (IOC) LEFT DIABETIC 23.62 01:55.0;  Surgeon: Trudi Fus, MD;  Location: [facility];  Service: Ophthalmology;  Laterality: Left;   CORONARY/GRAFT ACUTE MI REVASCULARIZATION     approx 2012   HIP ARTHROPLASTY Right 11/19/2020   Procedure: ARTHROPLASTY BIPOLAR HIP (HEMIARTHROPLASTY);  Surgeon: Rande Bushy, MD;  Location: [facility];  Service: Orthopedics;  Laterality: Right;   KNEE SURGERY Left     Physical Exam  Triage Vital Signs: ED Triage Vitals  Encounter Vitals Group     BP 08/22/23 1622 112/89     Systolic BP Percentile --      Diastolic BP Percentile --      Pulse Rate 08/22/23 1622 (!) 127     Resp 08/22/23 1622 20     Temp 08/22/23 1627 97.8 F (36.6 C)     Temp Source 08/22/23 1622 Oral     SpO2 --      Weight --      Height --      Head Circumference --      Peak Flow --      Pain Score 08/22/23 1621 0     Pain Loc --      Pain Education --      Exclude from Growth Chart --     Most  recent vital signs: Vitals:   08/22/23 1627 08/22/23 1700  BP:  105/72  Pulse:  76  Resp:  (!) 24  Temp: 97.8 F (36.6 C)   SpO2:  95%    General: Awake, no distress.  CV:  Good peripheral perfusion.  Irregular rhythm, heart rate 120-130 Resp:  Normal effort.  Crackles in the right base Abd:  No distention.  Soft, nontender Other:  1+ pitting edema bilateral lower legs with 2+ edema in bilateral feet   ED Results / Procedures / Treatments   Labs (all labs ordered are listed, but only abnormal results are displayed) Labs Reviewed  BASIC METABOLIC PANEL WITH GFR - Abnormal; Notable for the following components:      Result Value   Potassium 5.2 (*)    Chloride 94 (*)    Glucose, Bld 169 (*)    BUN 60 (*)    Creatinine, Ser 2.10 (*)    GFR, Estimated 22 (*)    Anion gap 16 (*)    All other components within normal limits  CBC - Abnormal; Notable for the following components:   WBC 19.4 (*)    Hemoglobin 9.4 (*)    HCT 32.6 (*)    MCV 78.7 (*)    MCH 22.7 (*)    MCHC 28.8 (*)    RDW 18.7 (*)    Platelets 427 (*)    nRBC 0.5 (*)    All other components within normal limits  PROTIME-INR - Abnormal; Notable for the following components:   Prothrombin Time 46.9 (*)    INR 5.0 (*)    All other components within normal limits  BRAIN NATRIURETIC PEPTIDE - Abnormal; Notable for the following components:   B Natriuretic Peptide 2,345.8 (*)    All other components within normal limits  TROPONIN I (HIGH SENSITIVITY) - Abnormal; Notable for the following components:   Troponin I (High Sensitivity) 165 (*)    All other components within normal limits  TROPONIN I (HIGH SENSITIVITY)     EKG Interpreted by me Atrial fibrillation, rate of 127.  Normal axis, normal intervals.  No acute ischemic changes.  He   RADIOLOGY Chest x-ray interpreted by me, shows small right effusion, no other consolidation or significant pulmonary  edema.   PROCEDURES:  Procedures   MEDICATIONS ORDERED IN ED: Medications  furosemide  (LASIX ) injection 40 mg (40 mg Intravenous Given 08/22/23 1710)     IMPRESSION / MDM / ASSESSMENT AND PLAN / ED COURSE  I reviewed the triage vital signs and the nursing notes.  DDx: NSTEMI, decompensated CHF, electrolyte derangement, AKI, anemia, pneumonia, UTI,  Patient's presentation is most consistent with acute presentation with potential threat to life or bodily function.     Clinical Course as of 08/22/23 1839  Tue Aug 22, 2023  1643 Patient arrives with worsening CHF symptoms over the past 2 weeks, accelerated in the last 48 hours with orthopnea, dyspnea on exertion, functionally very limited and unable to meet her basic needs at home due to the severity of her symptoms.  Chest x-ray shows a right pleural effusion.  Blood pressure in the ED is adequate, 90/70.  Baseline EF is 25%, and I think her volume overload is causing additional decrease in her cardiac output due to Starling effect.  Will start IV Lasix  and continue monitoring blood pressure.  I discussed the possibility of attempting cardioversion with patient and her daughter at bedside, they would like to avoid this if at all possible and note that she has been chronically in atrial fibrillation. [PS]    Clinical Course User Index [PS] Jacquie Maudlin, MD    ----------------------------------------- 6:38 PM on 08/22/2023 ----------------------------------------- Case discussed with hospitalist.   FINAL CLINICAL IMPRESSION(S) / ED DIAGNOSES   Final diagnoses:  Acute on chronic congestive heart failure, unspecified heart failure type (HCC)  AKI (acute kidney injury) (HCC)  Chronic atrial fibrillation (HCC)     Rx / DC Orders   ED Discharge Orders     None        Note:  This document was prepared using Dragon voice recognition software and may include unintentional dictation errors.   Jacquie Maudlin,  MD 08/22/23 [phone]

## 2023-08-22 NOTE — Patient Instructions (Signed)
 Special Instructions // Education:  YOUR DAUGHTER IS GOING TO TAKE YOU STRAIGHT TO THE ER FOR UNCONTROLLED A-FIB, FLUID RETENTION, LOW OXYGEN SATURATION, AND COOL EXTREMITIES.  At the [facility], you and your health needs are our priority. We have a designated team specialized in the treatment of Heart Failure. This Care Team includes your primary Heart Failure Specialized Cardiologist (physician), Advanced Practice Providers (APPs- Physician Assistants and Nurse Practitioners), and Pharmacist who all work together to provide you with the care you need, when you need it.   You may see any of the following providers on your designated Care Team at your next follow up:  Dr. Jules Oar Dr. Peder Bourdon Dr. Alwin Baars Dr. Judyth Nunnery Shawnee Dellen, FNP Bevely Brush, RPH-CPP  Please be sure to bring in all your medications bottles to every appointment.   Need to Contact Us :  If you have any questions or concerns before your next appointment please send us  a message through [place] or call our office at [phone].    TO LEAVE A MESSAGE FOR THE NURSE SELECT OPTION 2, PLEASE LEAVE A MESSAGE INCLUDING: YOUR NAME DATE OF BIRTH CALL BACK NUMBER REASON FOR CALL**this is important as we prioritize the call backs  YOU WILL RECEIVE A CALL BACK THE SAME DAY AS LONG AS YOU CALL BEFORE 4:00 PM

## 2023-08-22 NOTE — ED Notes (Signed)
 Called CCMD for pt placement on central monitoring

## 2023-08-22 NOTE — Assessment & Plan Note (Addendum)
 Home bisoprolol  10 mg p.o. twice daily resumed Metoprolol  injection 5 mg IV every 4 hours as needed for heart rate greater than 120, 3 doses ordered

## 2023-08-22 NOTE — Assessment & Plan Note (Signed)
 Continuous pulse oximetry ordered on admission Oxygen supplementation to maintain SpO2 greater than 92% Check procal to trend on admission, lactic acid

## 2023-08-22 NOTE — Assessment & Plan Note (Addendum)
 In setting of elevated leukocytosis, increased respiration rate, increased heart rate; organ involvement as cardiac and renal Source is unclear at this time however pneumonia cannot be excluded Blood cultures x 2 have been ordered on admission Lactic acid x 2 Procalcitonin Pending the above labs, sepsis bolus not initiated on admission Sodium chloride  100 mL/h, 1 day ordered on admission Maintain goal MAP > 65

## 2023-08-22 NOTE — ED Notes (Signed)
 Pt became very SOB and needed to sit up/tripod from the effort of moving from wheelchair to bed. EDP at bedside and primary RN informed. Oxygen turned to 4L.

## 2023-08-22 NOTE — Assessment & Plan Note (Addendum)
 On ckd 3a Sodium chloride  100 mL/h, 1 day ordered Recheck BMP in a.m.

## 2023-08-22 NOTE — Assessment & Plan Note (Signed)
 Home trilogy resumed on admission Albuterol  nebulizer every 6 hours as needed for shortness of breath and wheezing

## 2023-08-22 NOTE — Assessment & Plan Note (Addendum)
 Sepsis cannot be excluded at this time Check procalcitonin and lactic acid on admission Blood cultures x 2 ordered on admission

## 2023-08-22 NOTE — ED Triage Notes (Signed)
 First Nurse Note: Patient to ED from [facility] Heart Failure. Pt noted to be in afib with a rate between 110-140 with fluid retention. Wears 3L  at baseline. PT hypotensive at 80/58 at clinic. Noted to have fluid retention.

## 2023-08-22 NOTE — Hospital Course (Addendum)
 Ms. Angelica Ferguson is a 89 year old female with history of hypertension, iron  deficiency anemia, non-insulin -dependent diabetes mellitus, GERD, COPD, atrial fibrillation, CAD status post tenting, remote history of TB status post completion of antibiotic in [place] 30 years ago, chronic heart failure, who presents emergency department for chief concerns of shortness of breath.  Vitals in the ED showed temperature of 97.8, respiratory 24, heart rate 110, blood pressure 90/67, improved to 105/72, SpO2 91% on room air.  Serum sodium is 138, potassium 5.2, chloride 94, bicarb 28, BUN of 60, serum creatinine of 2.10, EGFR 22, nonfasting glucose 169, WBC 19.4, hemoglobin 9.4, platelets 427.  HS troponins were 165 and on repeat is 230  Portable chest x-ray: Was read as small right pleural effusion with right basilar atelectasis.  ED treatment: Furosemide  40 mg IV one-time dose.

## 2023-08-22 NOTE — Assessment & Plan Note (Addendum)
 Elevated troponin, patient denies chest pain currently I suspect this is secondary to demand ischemia in setting of pleural effusion Complete echo on 05/23/2023: Estimated ejection fraction is 25 to 30% Strict I's and O's

## 2023-08-22 NOTE — ED Triage Notes (Signed)
 Pt to ED with daughter, POV sent by outpatient cardiology. Sent for a fib, low BP (normal in triage).   Increased SOB and leg swelling since 2 weeks. Now wearing 3L all the time. Was using O2 PRN. Cannot sleep flat--needs to sleep upright.   Hx COPD, CHF. Denies chest pain. Takes blood thinner "sebasa".   Skin dry.

## 2023-08-22 NOTE — Progress Notes (Signed)
 [facility] - HEART FAILURE CLINIC - PHARMACIST COUNSELING NOTE  Guideline-Directed Medical Therapy/Evidence Based Medicine  ACE/ARB/ARNI: Losartan  12.5 mg daily Beta Blocker: Bisoprolol  10 mg daily  Aldosterone Antagonist:  NA Diuretic: Furosemide  20 mg daily SGLT2i:  NA  Adherence Assessment  Do you ever forget to take your medication? [] Yes [x] No  Do you ever skip doses due to side effects? [] Yes [x] No  Do you have trouble affording your medicines? [] Yes [x] No  Are you ever unable to pick up your medication due to transportation difficulties? [] Yes [x] No  Do you ever stop taking your medications because you don't believe they are helping? [] Yes [x] No  Do you check your weight daily? [x] Yes [] No   Adherence strategy: Daughter helps patient manage their medications  Barriers to obtaining medications: None identified   Vital signs: HR 110, BP 80/88, weight (pounds) 113 lbs (PTA 112 kg and its been going up steadily)  Echo 05/23/23: EF 25-30%, RV normal, normal PA pressure, mild LAE, mild/ moderate MR      Latest Ref Rng & Units 06/30/2023   11:33 AM 06/02/2023   10:47 AM 05/26/2023    5:01 AM  BMP  Glucose 70 - 99 mg/dL 161  096  045   BUN 8 - 27 mg/dL 14  32  58   Creatinine 0.57 - 1.00 mg/dL 4.09  8.11  9.14   BUN/Creat Ratio 12 - 28 14  27     Sodium 134 - 144 mmol/L 140  140  138   Potassium 3.5 - 5.2 mmol/L 4.1  3.7  4.0   Chloride 96 - 106 mmol/L 95  96  94   CO2 20 - 29 mmol/L 26  28  35   Calcium  8.7 - 10.3 mg/dL 9.9  8.4  8.4    Past Medical History:  Diagnosis Date   Acute blood loss anemia 12/04/2020   Acute on chronic hypoxic respiratory failure (HCC) 05/24/2023   Acute pulmonary edema (HCC) 05/24/2023   Acute renal failure (HCC) 12/04/2020   Allergic rhinitis 02/20/2023   Atrial fibrillation (HCC)    CAD (coronary artery disease)    Cerebrovascular accident (HCC) 04/26/2015   CHF (congestive heart failure) (HCC)    Chronic kidney  disease, stage 2 (mild) 03/30/2023   Closed fracture of right hip requiring operative repair, sequela 11/23/2020   COPD (chronic obstructive pulmonary disease) (HCC)    Coronary stent patent 06/19/2020   Diabetes mellitus without complication (HCC)    Former light tobacco smoker 06/22/2021   GERD (gastroesophageal reflux disease)    H/O TB (tuberculosis)    approx 1969.  Received treatment.   Hip fracture (HCC) 11/16/2020   History of CVA in adulthood 2016   TIA - no deficits   History of heart artery stent    approx 2012   Hyperlipidemia    Hypertension    Malnutrition of moderate degree 11/18/2020   Myocardial injury 05/24/2023   Normocytic anemia 11/03/2022   Pressure injury of skin 11/24/2020   Wears dentures    full upper and lower   Wears hearing aid in both ears    ASSESSMENT Angelica Ferguson is a 89 year old female who presents to the HF clinic with her daughter. She has a past medical history significant for COPD, atrial fibrillation, CAD s/p stenting, HTN, IDA, T2DM, GERD, HOH and chronic heart failure. Significant recent medical changes including stopping Cardizem  due to her low EF. Then digoxin  was also stopped due toxicity. Remeron  was added  to help with sleep at night. They report difficulty sleeping and loose cough.   During the appointment today, the patient's daughter reported they are getting little relief from the Remeron  and they are experiencing significant build-up of mucus in their throat. Additionally, the patient has had increased confusion over the last few days. They've had increased oxygen needs at home, increased petal edema and a decreased appetite the last few days. Per patient's daughter, their weight has been steadily increasing. They deny the patient is experiencing any dizziness or chest pain.   Recent ED Visit (past 6 months):  -- Date - 05/22/2023, CC - Atrial Fibrillation  PLAN -- Patient presents a delicate balancing act due to hypotension with increased HR  and fluid retention - there are no clear best options to control without bottoming out her pressures -- NP sent patient to the ED for further management   Time spent: 15 minutes  Craven Do, PharmD Pharmacy Resident  08/22/2023 3:03 PM

## 2023-08-22 NOTE — H&P (Addendum)
 History and Physical   Angelica Ferguson ZOX:[id number] DOB: 02-11-1933 DOA: 08/22/2023  PCP: Angelica Letters, MD  Outpatient Specialists: Heart care Patient coming from: from advanced heart failure clinic  I have personally briefly reviewed patient's old medical records in [facility] EMR.  Chief Concern: shortness of breath  HPI: Angelica Ferguson is a 89 year old female with history of hypertension, iron  deficiency anemia, non-insulin -dependent diabetes mellitus, GERD, COPD, atrial fibrillation, CAD status post tenting, remote history of TB status post completion of antibiotic in [place] 30 years ago, chronic heart failure, who presents emergency department for chief concerns of shortness of breath.  Vitals in the ED showed temperature of 97.8, respiratory 24, heart rate 110, blood pressure 90/67, improved to 105/72, SpO2 91% on room air.  Serum sodium is 138, potassium 5.2, chloride 94, bicarb 28, BUN of 60, serum creatinine of 2.10, EGFR 22, nonfasting glucose 169, WBC 19.4, hemoglobin 9.4, platelets 427.  HS troponins were 165 and on repeat is 230  Portable chest x-ray: Was read as small right pleural effusion with right basilar atelectasis.  ED treatment: Furosemide  40 mg IV one-time dose. ------------------------------ At bedside, patient is able to tell me her first and last name, she knows her birthdate.  She had to be reminded that her age is 54 not 33, 77 or 32.  She is able to identify her daughter, Angelica Ferguson at bedside.  Daughter reports that patient has been short of breath over the last few weeks especially last Sunday.  Daughter reports that her oxygen saturation has been decreasing in the 70s over the last 10 days.  Patient has been using her as needed oxygen more frequently as well.  Daughter denies fever, nausea, vomiting, diarrhea.  Patient denies chest pain at this time.  She reports her shortness of breath is improved with the oxygen supplementation.  Patient  denies trauma to her person.  Social history: She lives at home with her daughter.  She denies tobacco, EtOH, recreational drug use.  ROS: Constitutional: no weight change, no fever ENT/Mouth: no sore throat, no rhinorrhea Eyes: no eye pain, no vision changes Cardiovascular: no chest pain, + dyspnea,  no edema, no palpitations Respiratory: + cough, no sputum, + wheezing Gastrointestinal: no nausea, no vomiting, no diarrhea, no constipation Genitourinary: no urinary incontinence, no dysuria, no hematuria Musculoskeletal: no arthralgias, no myalgias Skin: no skin lesions, no pruritus, Neuro: + weakness, no loss of consciousness, no syncope Psych: no anxiety, no depression, + decrease appetite Heme/Lymph: no bruising, no bleeding  ED Course: Discussed with EDP, patient requiring hospitalization for chief concerns of hyperkalemia, right pleural effusion, and atrial fibrillation with RVR.  Assessment/Plan  Principal Problem:   Atrial fibrillation with RVR (HCC) Active Problems:   AKI (acute kidney injury) (HCC)   Acute hypoxic respiratory failure (HCC)   Leukocytosis   Elevated troponin   Sepsis (HCC)   Atrial fibrillation (HCC)   Essential hypertension   COPD (chronic obstructive pulmonary disease) (HCC)   Diabetes mellitus type 2, diet-controlled (HCC)   Dilated cardiomyopathy (HCC)   GERD (gastroesophageal reflux disease)   BMI less than 19,adult   Pleural effusion on right   History of TB (tuberculosis)   Assessment and Plan:  * Atrial fibrillation with RVR (HCC) Home bisoprolol  10 mg p.o. twice daily resumed Metoprolol  injection 5 mg IV every 4 hours as needed for heart rate greater than 120, 3 doses ordered  Sepsis (HCC) In setting of elevated leukocytosis, increased respiration rate, increased heart rate; organ  involvement as cardiac and renal Source is unclear at this time however pneumonia cannot be excluded Blood cultures x 2 have been ordered on  admission Lactic acid x 2 Procalcitonin Pending the above labs, sepsis bolus not initiated on admission Sodium chloride  100 mL/h, 1 day ordered on admission Maintain goal MAP > 65  Elevated troponin Elevated troponin, patient denies chest pain currently I suspect this is secondary to demand ischemia in setting of pleural effusion Complete echo on 05/23/2023: Estimated ejection fraction is 25 to 30% Strict I's and O's  Leukocytosis Sepsis cannot be excluded at this time Check procalcitonin and lactic acid on admission Blood cultures x 2 ordered on admission  Acute hypoxic respiratory failure (HCC) Continuous pulse oximetry ordered on admission Oxygen supplementation to maintain SpO2 greater than 92% Check procal to trend on admission, lactic acid  AKI (acute kidney injury) (HCC) On ckd 3a Sodium chloride  100 mL/h, 1 day ordered Recheck BMP in a.m.  Atrial fibrillation (HCC) Home edoxaban  30 mg daily resumed on admission 08/13/2023  Essential hypertension Home losartan  12.5 mg daily resume Bisoprolol  10 mg p.o. twice daily resumed  COPD (chronic obstructive pulmonary disease) (HCC) Home trilogy resumed on admission Albuterol  nebulizer every 6 hours as needed for shortness of breath and wheezing  Chart reviewed.   DVT prophylaxis: Edoxaban  30 mg daily resumed Code Status: full code Diet: N.p.o. pending nursing swallow study Family Communication: updated daughter, Angelica Ferguson at bedside  Disposition Plan: Pending clinical course Consults called: pharmacy  Admission status: Telemetry cardiac, inpatient  Past Medical History:  Diagnosis Date   Acute blood loss anemia 12/04/2020   Acute on chronic hypoxic respiratory failure (HCC) 05/24/2023   Acute pulmonary edema (HCC) 05/24/2023   Acute renal failure (HCC) 12/04/2020   Allergic rhinitis 02/20/2023   Asthma    Atrial fibrillation (HCC)    CAD (coronary artery disease)    Cerebrovascular accident (HCC) 04/26/2015   CHF  (congestive heart failure) (HCC)    Chronic kidney disease, stage 2 (mild) 03/30/2023   Closed fracture of right hip requiring operative repair, sequela 11/23/2020   COPD (chronic obstructive pulmonary disease) (HCC)    Coronary stent patent 06/19/2020   Diabetes mellitus without complication (HCC)    Former light tobacco smoker 06/22/2021   GERD (gastroesophageal reflux disease)    H/O TB (tuberculosis)    approx 1969.  Received treatment.   Hip fracture (HCC) 11/16/2020   History of CVA in adulthood 2016   TIA - no deficits   History of heart artery stent    approx 2012   Hyperlipidemia    Hypertension    Malnutrition of moderate degree 11/18/2020   Myocardial injury 05/24/2023   Normocytic anemia 11/03/2022   Pressure injury of skin 11/24/2020   Wears dentures    full upper and lower   Wears hearing aid in both ears    Past Surgical History:  Procedure Laterality Date   CATARACT EXTRACTION Left    approx 2019   CATARACT EXTRACTION W/PHACO Left 04/20/2023   Procedure: CATARACT EXTRACTION PHACO AND INTRAOCULAR LENS PLACEMENT (IOC) LEFT DIABETIC 23.62 01:55.0;  Surgeon: Trudi Fus, MD;  Location: [facility];  Service: Ophthalmology;  Laterality: Left;   CORONARY/GRAFT ACUTE MI REVASCULARIZATION     approx 2012   HIP ARTHROPLASTY Right 11/19/2020   Procedure: ARTHROPLASTY BIPOLAR HIP (HEMIARTHROPLASTY);  Surgeon: Rande Bushy, MD;  Location: [facility];  Service: Orthopedics;  Laterality: Right;   KNEE SURGERY Left    Social History:  reports that she quit smoking about 54 years ago. Her smoking use included cigarettes. She started smoking about 69 years ago. She has a 7.5 pack-year smoking history. She has never used smokeless tobacco. She reports that she does not currently use alcohol. She reports that she does not use drugs.  Allergies  Allergen Reactions   Sulfa Antibiotics     Blood pressure dropped   Family History  Problem Relation Age of  Onset   Heart disease Sister    Cancer Sister    Family history: Family history reviewed and not pertinent.  Prior to Admission medications   Medication Sig Start Date End Date Taking? Authorizing Provider  traZODone  (DESYREL ) 50 MG tablet Take 25-50 mg by mouth at bedtime as needed. 07/26/23  Yes [provider]  albuterol  (VENTOLIN  HFA) 108 (90 Base) MCG/ACT inhaler Inhale 1 puff into the lungs every 6 (six) hours as needed for wheezing or shortness of breath.    [provider]  atorvastatin  (LIPITOR ) 40 MG tablet Take 1 tablet (40 mg total) by mouth 2 (two) times daily. 07/12/23   Charlette Console, FNP  benzonatate  (TESSALON ) 200 MG capsule Take 200 mg by mouth 3 (three) times daily as needed. 05/11/23   [provider]  bisoprolol  (ZEBETA ) 10 MG tablet Take 1 tablet (10 mg total) by mouth 2 (two) times daily. 06/28/23   Angelica Letters, MD  chlorhexidine  (PERIDEX ) 0.12 % solution Use as directed 15 mLs in the mouth or throat 2 (two) times daily. 08/04/23   Angelica Letters, MD  doxycycline  (VIBRA -TABS) 100 MG tablet Use for twice daily for 5 days if COPD flare Patient not taking: Reported on 08/22/2023 08/04/23   Angelica Letters, MD  edoxaban  (SAVAYSA ) 30 MG TABS tablet Take 1 tablet (30 mg total) by mouth daily. 08/04/23   Angelica Letters, MD  Fluticasone -Umeclidin-Vilant (TRELEGY ELLIPTA ) 100-62.5-25 MCG/ACT AEPB Inhale 1 puff into the lungs daily. 05/11/23 05/10/24  Angelica Letters, MD  furosemide  (LASIX ) 20 MG tablet Take 1 tablet (20 mg total) by mouth daily. 06/22/23   Charlette Console, FNP  losartan  (COZAAR ) 25 MG tablet Take 0.5 tablets (12.5 mg total) by mouth at bedtime. 07/04/23   Charlette Console, FNP  mirtazapine  (REMERON  SOL-TAB) 30 MG disintegrating tablet Take 1 tablet (30 mg total) by mouth at bedtime. 08/04/23   Angelica Letters, MD  pantoprazole  (PROTONIX ) 40 MG tablet Take 1 tablet (40 mg total) by mouth daily. 06/28/23 06/27/24  Angelica Letters, MD  predniSONE   (DELTASONE ) 10 MG tablet Use one pill daily for 5 days if COPD flare 08/04/23   Angelica Letters, MD   Physical Exam: Vitals:   08/22/23 1700 08/22/23 2100 08/22/23 2130 08/22/23 2158  BP: 105/72 100/72 109/72 96/69  Pulse: 76   (!) 130  Resp: (!) 24 (!) 28 (!) 23 (!) 22  Temp:    97.8 F (36.6 C)  TempSrc:      SpO2: 95%   100%   Constitutional: appears frail, age-appropriate Eyes: PERRL, lids and conjunctivae normal ENMT: Mucous membranes are moist. Posterior pharynx clear of any exudate or lesions. Age-appropriate dentition. Hearing appropriate Neck: normal, supple, no masses, no thyromegaly Respiratory: Decreased sound in the right lower lobe to middle lobe, no wheezing, no crackles. Normal respiratory effort. No accessory muscle use.  Cardiovascular: Regular rate and rhythm, no murmurs / rubs / gallops. No extremity edema. 2+ pedal pulses. No carotid bruits.  Abdomen: no tenderness,  no masses palpated, no hepatosplenomegaly. Bowel sounds positive.  Musculoskeletal: no clubbing / cyanosis. No joint deformity upper and lower extremities. Good ROM, no contractures, no atrophy. Normal muscle tone.  Skin: no rashes, lesions, ulcers. No induration Neurologic: Sensation intact. Strength 5/5 in all 4.  Psychiatric: Normal judgment and insight. Alert and oriented x 3. Normal mood.   EKG: independently reviewed, showing atrial fibrillation with RVR, rate of 127, QTc 438  Chest x-ray on Admission: I personally reviewed and I agree with radiologist reading as below.  DG Chest Port 1 View Result Date: 08/22/2023 CLINICAL DATA:  Shortness of breath. EXAM: PORTABLE CHEST 1 VIEW COMPARISON:  May 23, 2023. FINDINGS: Stable cardiomediastinal silhouette. Stable calcified granuloma seen in left lung apex. Small right pleural effusion is noted with associated atelectasis. Bony thorax is unremarkable. IMPRESSION: Small right pleural effusion is noted with associated right basilar atelectasis. Aortic  Atherosclerosis (ICD10-I70.0). Electronically Signed   By: Rosalene Colon M.D.   On: 08/22/2023 17:02   Labs on Admission: I have personally reviewed following labs  CBC: Recent Labs  Lab 08/22/23 1646  WBC 19.4*  HGB 9.4*  HCT 32.6*  MCV 78.7*  PLT 427*   Basic Metabolic Panel: Recent Labs  Lab 08/22/23 1646  NA 138  K 5.2*  CL 94*  CO2 28  GLUCOSE 169*  BUN 60*  CREATININE 2.10*  CALCIUM  9.3   GFR: Estimated Creatinine Clearance: 15 mL/min (A) (by C-G formula based on SCr of 2.1 mg/dL (H)).  Coagulation Profile: Recent Labs  Lab 08/22/23 1646  INR 5.0*   Urine analysis:    Component Value Date/Time   COLORURINE STRAW (A) 05/22/2023 2142   APPEARANCEUR CLEAR (A) 05/22/2023 2142   LABSPEC 1.013 05/22/2023 2142   PHURINE 5.0 05/22/2023 2142   GLUCOSEU NEGATIVE 05/22/2023 2142   HGBUR MODERATE (A) 05/22/2023 2142   BILIRUBINUR NEGATIVE 05/22/2023 2142   KETONESUR NEGATIVE 05/22/2023 2142   PROTEINUR NEGATIVE 05/22/2023 2142   NITRITE NEGATIVE 05/22/2023 2142   LEUKOCYTESUR NEGATIVE 05/22/2023 2142   CRITICAL CARE Performed by: Trenton Frock Lorane Cousar  Total critical care time: 32 minutes  Critical care time was exclusive of separately billable procedures and treating other patients.  Critical care was necessary to treat or prevent imminent or life-threatening deterioration.  Critical care was time spent personally by me on the following activities: development of treatment plan with patient and daughter as well as nursing, discussions with consultants, evaluation of patient's response to treatment, examination of patient, obtaining history from patient or surrogate, ordering and performing treatments and interventions, ordering and review of laboratory studies, ordering and review of radiographic studies, pulse oximetry and re-evaluation of patient's condition.  This document was prepared using Dragon Voice Recognition software and may include unintentional dictation  errors.  Dr. Reinhold Carbine [facility]  If 7PM-7AM, please contact overnight-coverage provider If 7AM-7PM, please contact day attending provider [email or web address]  08/22/2023, 11:01 PM

## 2023-08-22 NOTE — Assessment & Plan Note (Signed)
 Home losartan  12.5 mg daily resume Bisoprolol  10 mg p.o. twice daily resumed

## 2023-08-23 ENCOUNTER — Other Ambulatory Visit: Payer: Self-pay | Admitting: Pediatrics

## 2023-08-23 DIAGNOSIS — I469 Cardiac arrest, cause unspecified: Secondary | ICD-10-CM

## 2023-08-23 DIAGNOSIS — G4700 Insomnia, unspecified: Secondary | ICD-10-CM

## 2023-08-23 LAB — LACTIC ACID, PLASMA: Lactic Acid, Venous: 3.5 mmol/L (ref 0.5–1.9)

## 2023-08-23 LAB — PROCALCITONIN: Procalcitonin: <0.1 ng/mL

## 2023-08-23 LAB — GLUCOSE, CAPILLARY: Glucose-Capillary: 153 mg/dL — ABNORMAL HIGH (ref 70–99)

## 2023-08-23 MED ORDER — HALOPERIDOL LACTATE 2 MG/ML PO CONC: 0.5000 mg | ORAL | Status: DC | PRN

## 2023-08-23 MED ORDER — GLYCOPYRROLATE 0.2 MG/ML IJ SOLN: 0.2000 mg | INTRAMUSCULAR | Status: DC | PRN

## 2023-08-23 MED ORDER — GLYCOPYRROLATE 1 MG PO TABS: 1.0000 mg | ORAL_TABLET | ORAL | Status: DC | PRN

## 2023-08-23 MED ORDER — HALOPERIDOL 0.5 MG PO TABS: 0.5000 mg | ORAL_TABLET | ORAL | Status: DC | PRN

## 2023-08-23 MED ORDER — BIOTENE DRY MOUTH MT LIQD: 15.0000 mL | OROMUCOSAL | Status: DC | PRN

## 2023-08-23 MED ORDER — HALOPERIDOL LACTATE 5 MG/ML IJ SOLN: 0.5000 mg | INTRAMUSCULAR | Status: DC | PRN

## 2023-08-23 MED ORDER — POLYVINYL ALCOHOL 1.4 % OP SOLN: 1.0000 [drp] | Freq: Four times a day (QID) | OPHTHALMIC | Status: DC | PRN

## 2023-08-24 ENCOUNTER — Ambulatory Visit: Payer: 59 | Admitting: Cardiology

## 2023-08-24 NOTE — ED Provider Notes (Signed)
 Department of Emergency Medicine CODE BLUE CONSULTATION    Code Blue CONSULT NOTE  Chief Complaint: Cardiac arrest/unresponsive   Level V Caveat: Unresponsive  History of present illness: I was contacted by the hospital for a CODE BLUE cardiac arrest upstairs and presented to the patient's bedside.   Patient admitted to the hospital for A-fib with RVR, CHF, hypotension.  Nursing staff reports that patient became bradycardic, lost pulses and was in PEA.  CPR initiated.  Patient being ventilated by BVM on my arrival.  ROS: Unable to obtain, Level V caveat  Scheduled Meds:  atorvastatin   80 mg Oral Daily   bisoprolol   10 mg Oral BID   budeson-glycopyrrolate -formoterol   2 puff Inhalation BID   chlorhexidine   15 mL Mouth/Throat BID   edoxaban   30 mg Oral Daily   glucagon (human recombinant)       mirtazapine   30 mg Oral QHS   pantoprazole   40 mg Oral Daily   Continuous Infusions:  sodium chloride      PRN Meds:.acetaminophen  **OR** acetaminophen , albuterol , antiseptic oral rinse, benzonatate , glucagon (human recombinant), glycopyrrolate  **OR** glycopyrrolate  **OR** glycopyrrolate , haloperidol **OR** haloperidol **OR** haloperidol lactate, metoprolol  tartrate, ondansetron  **OR** ondansetron  (ZOFRAN ) IV, polyvinyl alcohol Past Medical History:  Diagnosis Date   Acute blood loss anemia 12/04/2020   Acute on chronic hypoxic respiratory failure (HCC) 05/24/2023   Acute pulmonary edema (HCC) 05/24/2023   Acute renal failure (HCC) 12/04/2020   Allergic rhinitis 02/20/2023   Asthma    Atrial fibrillation (HCC)    CAD (coronary artery disease)    Cerebrovascular accident (HCC) 04/26/2015   CHF (congestive heart failure) (HCC)    Chronic kidney disease, stage 2 (mild) 03/30/2023   Closed fracture of right hip requiring operative repair, sequela 11/23/2020   COPD (chronic obstructive pulmonary disease) (HCC)    Coronary stent patent 06/19/2020   Diabetes mellitus without  complication (HCC)    Former light tobacco smoker 06/22/2021   GERD (gastroesophageal reflux disease)    H/O TB (tuberculosis)    approx 1969.  Received treatment.   Hip fracture (HCC) 11/16/2020   History of CVA in adulthood 2016   TIA - no deficits   History of heart artery stent    approx 2012   Hyperlipidemia    Hypertension    Malnutrition of moderate degree 11/18/2020   Myocardial injury 05/24/2023   Normocytic anemia 11/03/2022   Pressure injury of skin 11/24/2020   Wears dentures    full upper and lower   Wears hearing aid in both ears    Past Surgical History:  Procedure Laterality Date   CATARACT EXTRACTION Left    approx 2019   CATARACT EXTRACTION W/PHACO Left 04/20/2023   Procedure: CATARACT EXTRACTION PHACO AND INTRAOCULAR LENS PLACEMENT (IOC) LEFT DIABETIC 23.62 01:55.0;  Surgeon: Trudi Fus, MD;  Location: [facility];  Service: Ophthalmology;  Laterality: Left;   CORONARY/GRAFT ACUTE MI REVASCULARIZATION     approx 2012   HIP ARTHROPLASTY Right 11/19/2020   Procedure: ARTHROPLASTY BIPOLAR HIP (HEMIARTHROPLASTY);  Surgeon: Rande Bushy, MD;  Location: [facility];  Service: Orthopedics;  Laterality: Right;   KNEE SURGERY Left    Social History   Socioeconomic History   Marital status: Widowed    Spouse name: Not on file   Number of children: Not on file   Years of education: Not on file   Highest education level: Not on file  Occupational History   Not on file  Tobacco Use   Smoking  status: Former    Current packs/day: 0.00    Average packs/day: 0.5 packs/day for 15.0 years (7.5 ttl pk-yrs)    Types: Cigarettes    Start date: 35    Quit date: 1971    Years since quitting: 54.3   Smokeless tobacco: Never  Vaping Use   Vaping status: Never Used  Substance and Sexual Activity   Alcohol use: Not Currently    Comment: Rare   Drug use: Never   Sexual activity: Not Currently  Other Topics Concern   Not on file  Social History  Narrative   Not on file   Social Drivers of Health   Financial Resource Strain: Low Risk  (05/30/2023)   Overall Financial Resource Strain (CARDIA)    Difficulty of Paying Living Expenses: Not hard at all  Food Insecurity: No Food Insecurity (05/30/2023)   Hunger Vital Sign    Worried About Running Out of Food in the Last Year: Never true    Ran Out of Food in the Last Year: Never true  Transportation Needs: No Transportation Needs (05/30/2023)   PRAPARE - Administrator, Civil Service (Medical): No    Lack of Transportation (Non-Medical): No  Physical Activity: Unknown (05/30/2023)   Exercise Vital Sign    Days of Exercise per Week: 0 days    Minutes of Exercise per Session: Not on file  Stress: No Stress Concern Present (05/30/2023)   [employer] of Occupational Health - Occupational Stress Questionnaire    Feeling of Stress : Only a little  Social Connections: Socially Isolated (05/30/2023)   Social Connection and Isolation Panel [NHANES]    Frequency of Communication with Friends and Family: Never    Frequency of Social Gatherings with Friends and Family: Never    Attends Religious Services: Never    Database administrator or Organizations: No    Attends Banker Meetings: Never    Marital Status: Widowed  Intimate Partner Violence: Not At Risk (05/23/2023)   Humiliation, Afraid, Rape, and Kick questionnaire    Fear of Current or Ex-Partner: No    Emotionally Abused: No    Physically Abused: No    Sexually Abused: No   Allergies  Allergen Reactions   Sulfa Antibiotics     Blood pressure dropped    Last set of Vital Signs (not current) Vitals:   08/22/23 2158 08/22/23 2311  BP: 96/69 94/62  Pulse: (!) 130 (!) 127  Resp: (!) 22 (!) 23  Temp: 97.8 F (36.6 C) 97.6 F (36.4 C)  SpO2: 100%       Physical Exam  Gen: unresponsive Cardiovascular: pulseless  Resp: apneic. Breath sounds equal bilaterally with bagging  Abd: nondistended  Neuro:  GCS 3, unresponsive to pain  HEENT: No blood in posterior pharynx, gag reflex absent  Neck: No crepitus  Musculoskeletal: No deformity  Skin: warm  Procedures  INTUBATION Performed by: Verneda Golder Required items: required blood products, implants, devices, and special equipment available Patient identity confirmed: provided demographic data and hospital-assigned identification number Time out: Immediately prior to procedure a "time out" was called to verify the correct patient, procedure, equipment, support staff and site/side marked as required. Indications: Cardiac arrest, respiratory failure Intubation method: GlideScope Preoxygenation: BVM Sedatives: None Paralytic: None Tube Size: 7.5 cuffed Post-procedure assessment: chest rise and ETCO2 monitor Breath sounds: equal and absent over the epigastrium Tube secured by Respiratory Therapy Patient tolerated the procedure well with no immediate complications.  CRITICAL CARE Performed  by: Verneda Golder Total critical care time: 30 Critical care time was exclusive of separately billable procedures and treating other patients. Critical care was necessary to treat or prevent imminent or life-threatening deterioration. Critical care was time spent personally by me on the following activities: development of treatment plan with patient and/or surrogate as well as nursing, discussions with consultants, evaluation of patient's response to treatment, examination of patient, obtaining history from patient or surrogate, ordering and performing treatments and interventions, ordering and review of laboratory studies, ordering and review of radiographic studies, pulse oximetry and re-evaluation of patient's condition.  Cardiopulmonary Resuscitation (CPR) Procedure Note  Directed/Performed by: Verneda Golder I personally directed ancillary staff and/or performed CPR in an effort to regain return of spontaneous circulation and to maintain cardiac, neuro  and systemic perfusion.    Medical Decision making  Patient admitted for atrial fibrillation with RVR, CHF exacerbation, hypotension.  Was getting IV fluids and then became bradycardic, unresponsive and went into PEA.  On my arrival to the room patient is being ventilated through BVM and compressions are being given.  Patient had already received epinephrine .  Patient received multiple rounds of epinephrine , bicarb, calcium .  Rhythm was PEA.  Patient intubated using 7.5 endotracheal tube without sedation, paralytics.  Blood glucose 153.  After about 10 minutes of CPR we had return of spontaneous circulation.  Patient appeared to be in A-fib with RVR with runs of ventricular tachycardia.  Patient was given 150 mg of IV amiodarone.  Hospitalist reports patient had received IV sotalol for tachycardia just prior to the code.  She was given glucagon.  Patient then went into rapid A-fib with rates in the 220s and then transitioned into ventricular tachycardia with pressures in the 60s.  Patient was shocked using 120 J synchronized cardioversion without significant change in rhythm.  Dr. Vallarie Gauze, hospitalist at bedside as well as Recardo Canal Sarina Curb NP with ICU.  Dr. Vallarie Gauze spoke with patient's daughter who is at bedside who now would like to for care.  Will discontinue further resuscitative measures.  Will give morphine  prior to terminal extubation.  Assessment and Plan  Patient underwent cardiac arrest and respiratory failure.  Was admitted for A-fib with RVR, CHF exacerbation, hypotension.  Did obtain return of spontaneous circulation with A-fib with RVR and V. tach after about 10 minutes of CPR and intubation.  At that time family decided to transition to comfort care.  Please see hospitalist note for further details.    Melissia Lahman, Clover Dao, DO 08/13/2023 [phone]

## 2023-08-24 NOTE — Discharge Summary (Addendum)
 Physician Discharge Summary   Patient: Angelica Ferguson MRN: [id number] DOB: 1933-01-25  Admit date:     08/22/2023  Discharge date: 07/31/2023  Discharge Physician: Dr. Reinhold Carbine   PCP: Hadassah Letters, MD   Discharge Diagnoses: Principal Problem:   Atrial fibrillation with RVR [facility]) Active Problems:   AKI (acute kidney injury) (HCC)   Acute hypoxic respiratory failure (HCC)   Leukocytosis   Elevated troponin   Sepsis (HCC)   Atrial fibrillation (HCC)   Essential hypertension   COPD (chronic obstructive pulmonary disease) (HCC)   Diabetes mellitus type 2, diet-controlled (HCC)   Dilated cardiomyopathy (HCC)   GERD (gastroesophageal reflux disease)   BMI less than 19,adult   Pleural effusion on right   History of TB (tuberculosis)   Cardiac arrest (HCC)  Resolved Problems:   * No resolved hospital problems. *  Hospital Course:  Angelica Ferguson is a very pleasant 89 year old female with history of hypertension, iron  deficiency anemia, non-insulin -dependent diabetes mellitus, GERD, COPD, atrial fibrillation, CAD status post tenting, remote history of TB status post completion of antibiotic in [place] 30 years ago, chronic heart failure, who presents emergency department for chief concerns of shortness of breath.  Vitals in the ED showed temperature of 97.8, respiratory 24, heart rate 110, blood pressure 90/67, improved to 105/72, SpO2 91% on room air.  Serum sodium is 138, potassium 5.2, chloride 94, bicarb 28, BUN of 60, serum creatinine of 2.10, EGFR 22, nonfasting glucose 169, WBC 19.4, hemoglobin 9.4, platelets 427.  HS troponins were 165 and on repeat is 230  Portable chest x-ray: Was read as small right pleural effusion with right basilar atelectasis.  ED treatment: Furosemide  40 mg IV one-time dose.  Patient was admitted to hospitalist service for atrial fibrillation with RVR and right pleural effusion with possible concerns for sepsis though a source is unclear at the  time.  Patient was initially given furosemide  40 mg IV one-time dose by EDP however as patient's heart rate continue to be in atrial fibrillation with rapid ventricular response and low normotensive blood pressure, I was concerned for intravascular depletion therefore sodium chloride  infusion was initiated and further furosemide  was discontinued.  Noted that patient had heart failure with reduced ejection fraction, bolus was not given and sodium chloride  infusion at 100 mL/h was initiated.  Lactic acid and blood cultures x 2, procalcitonin were ordered on admission.  Patient was also noted to have elevated high sensitivity troponin with positive delta however as patient denies chest pain, chest pressure, or worsening shortness of breath at the time of evaluation, the initial etiology was presumed to be demand ischemia in setting of acute hypoxic respiratory failure secondary to right pleural effusion and patient meeting sepsis criteria/SIRS criteria.  Assessment and Plan:  * Atrial fibrillation with RVR (HCC) Home bisoprolol  10 mg p.o. twice daily resumed Metoprolol  injection 5 mg IV every 4 hours as needed for heart rate greater than 120, 3 doses ordered  Sepsis (HCC) In setting of elevated leukocytosis, increased respiration rate, increased heart rate; organ involvement as cardiac and renal Source is unclear at this time however pneumonia cannot be excluded Blood cultures x 2 have been ordered on admission Lactic acid x 2 Procalcitonin Pending the above labs, sepsis bolus not initiated on admission Sodium chloride  100 mL/h, 1 day ordered on admission Maintain goal MAP > 65  Elevated troponin Elevated troponin, patient denies chest pain currently I suspect this is secondary to demand ischemia in setting of pleural effusion Complete  echo on 05/23/2023: Estimated ejection fraction is 25 to 30% Strict I's and O's  Leukocytosis Sepsis cannot be excluded at this time Check procalcitonin and  lactic acid on admission Blood cultures x 2 ordered on admission  Acute hypoxic respiratory failure (HCC) Continuous pulse oximetry ordered on admission Oxygen supplementation to maintain SpO2 greater than 92% Check procal to trend on admission, lactic acid  AKI (acute kidney injury) (HCC) On ckd 3a Sodium chloride  100 mL/h, 1 day ordered Recheck BMP in a.m.  Atrial fibrillation (HCC) Home edoxaban  30 mg daily resumed on admission 01-Sep-2023  Essential hypertension Home losartan  12.5 mg daily resume Bisoprolol  10 mg p.o. twice daily resumed  COPD (chronic obstructive pulmonary disease) (HCC) Home trilogy resumed on admission Albuterol  nebulizer every 6 hours as needed for shortness of breath and wheezing  Upon arrival to medical floor, patient had low blood pressure with continued atrial fibrillation with RVR.  Patient's map was 36 with a blood pressure of 94/62, heart rate of 128.  Home bisoprolol  was resumed on admission and given.  As needed metoprolol  5 mg IV was not administered due to concerns of low blood pressure.  A rapid response was called and cross coverage provider was messaged.  At approximately 11:35 PM, CODE BLUE was called, ED provider presented and patient received ETT procedure, acls medication and chest compressions. ROSC  as achieved at approximately 23:47.    Daughter discussed case with cross coverage provider stating that she would not want her mother to have further ACLS interventions.  ACLS interventions were discontinued and patient was placed on comfort care.  Per [facility] at [facility] on 2023-09-01, time of death was approximately 00:30.  Pain control - [place]  Controlled Substance Reporting System database was reviewed. and patient was instructed, not to drive, operate heavy machinery, perform activities at heights, swimming or participation in water activities or provide baby-sitting services while on Pain, Sleep and Anxiety Medications; until their  outpatient Physician has advised to do so again. Also recommended to not to take more than prescribed Pain, Sleep and Anxiety Medications.   Consultants: PCCM, chaplain Procedures performed: ETT  Disposition:  deceased Diet recommendation:  Cardiac diet DISCHARGE MEDICATION: Allergies as of Sep 01, 2023       Reactions   Sulfa Antibiotics    Blood pressure dropped        Medication List     ASK your doctor about these medications    albuterol  108 (90 Base) MCG/ACT inhaler Commonly known as: VENTOLIN  HFA Inhale 1 puff into the lungs every 6 (six) hours as needed for wheezing or shortness of breath.   atorvastatin  40 MG tablet Commonly known as: LIPITOR  Take 1 tablet (40 mg total) by mouth 2 (two) times daily.   benzonatate  200 MG capsule Commonly known as: TESSALON  Take 200 mg by mouth 3 (three) times daily as needed.   bisoprolol  10 MG tablet Commonly known as: ZEBETA  Take 1 tablet (10 mg total) by mouth 2 (two) times daily.   doxycycline  100 MG tablet Commonly known as: VIBRA -TABS Use for twice daily for 5 days if COPD flare   edoxaban  30 MG Tabs tablet Commonly known as: SAVAYSA  Take 1 tablet (30 mg total) by mouth daily.   furosemide  20 MG tablet Commonly known as: LASIX  Take 1 tablet (20 mg total) by mouth daily.   losartan  25 MG tablet Commonly known as: COZAAR  Take 0.5 tablets (12.5 mg total) by mouth at bedtime.   mirtazapine  30 MG disintegrating tablet Commonly known  as: REMERON  SOL-TAB Take 1 tablet (30 mg total) by mouth at bedtime.   pantoprazole  40 MG tablet Commonly known as: Protonix  Take 1 tablet (40 mg total) by mouth daily.   predniSONE  10 MG tablet Commonly known as: DELTASONE  Use one pill daily for 5 days if COPD flare   Trelegy Ellipta  100-62.5-25 MCG/ACT Aepb Generic drug: Fluticasone -Umeclidin-Vilant Inhale 1 puff into the lungs daily.        Discharge Exam: There were no vitals filed for this visit.  Condition at  discharge:  deceased  The results of significant diagnostics from this hospitalization (including imaging, microbiology, ancillary and laboratory) are listed below for reference.   Imaging Studies: DG Chest Port 1 View Result Date: 08/22/2023 CLINICAL DATA:  Shortness of breath. EXAM: PORTABLE CHEST 1 VIEW COMPARISON:  May 23, 2023. FINDINGS: Stable cardiomediastinal silhouette. Stable calcified granuloma seen in left lung apex. Small right pleural effusion is noted with associated atelectasis. Bony thorax is unremarkable. IMPRESSION: Small right pleural effusion is noted with associated right basilar atelectasis. Aortic Atherosclerosis (ICD10-I70.0). Electronically Signed   By: Rosalene Colon M.D.   On: 08/22/2023 17:02   Microbiology: Results for orders placed or performed during the hospital encounter of 08/22/23  Culture, blood (Routine X 2) w Reflex to ID Panel     Status: None (Preliminary result)   Collection Time: 08/22/23 10:43 PM   Specimen: BLOOD  Result Value Ref Range Status   Specimen Description BLOOD BLOOD RIGHT HAND  Final   Special Requests   Final    BOTTLES DRAWN AEROBIC ONLY Blood Culture results may not be optimal due to an inadequate volume of blood received in culture bottles   Culture   Final    NO GROWTH < 12 HOURS Performed at [facility], [address] [place], [place] [address]    Report Status PENDING  Incomplete  Culture, blood (Routine X 2) w Reflex to ID Panel     Status: None (Preliminary result)   Collection Time: 08/22/23 10:43 PM   Specimen: BLOOD  Result Value Ref Range Status   Specimen Description BLOOD BLOOD LEFT ARM  Final   Special Requests   Final    BOTTLES DRAWN AEROBIC ONLY Blood Culture results may not be optimal due to an inadequate volume of blood received in culture bottles   Culture   Final    NO GROWTH < 12 HOURS Performed at [facility], [address], [place], [place] [address]    Report Status  PENDING  Incomplete   Labs: reviewed  CBC: Recent Labs  Lab 08/22/23 1646  WBC 19.4*  HGB 9.4*  HCT 32.6*  MCV 78.7*  PLT 427*   Basic Metabolic Panel: Recent Labs  Lab 08/22/23 1646  NA 138  K 5.2*  CL 94*  CO2 28  GLUCOSE 169*  BUN 60*  CREATININE 2.10*  CALCIUM  9.3   CBG: Recent Labs  Lab 08/22/23 2348  GLUCAP 153*   Discharge time spent: greater than 30 minutes.  Signed:  Dr. Reinhold Carbine [facility] 07/26/2023

## 2023-08-24 NOTE — Progress Notes (Signed)
 CRITICAL VALUE STICKER  CRITICAL VALUE: lactic acid 3.5   RECEIVER (on-site recipient of call): Janda Cargo  DATE & TIME NOTIFIED: 07/29/2023 12:21Am  MESSENGER (representative from lab): jeth  MD NOTIFIED:   TIME OF NOTIFICATION:  RESPONSE:

## 2023-08-24 NOTE — Progress Notes (Signed)
 Pt came in for SOB . dx with afib RVR. Staff RN administered the bisopropol. Vitals as follows: HR 128, BP 94/62 MAP 73, RR 23 Temp 97.6 oral. HR been running from 109 to 128 but non sustain. Pt has PRN metropolol 5 mg IV but staff RN concern is getting her blood pressure too low. Pt is red mews.Aaron Aas Hx of CHF has an order for 0.9 % sodium chloride  at 100 ml/hr and d/c IV lasix .Lactic acid pending.  Daughter at bedside reported that they will pull off fluids out her lungs in the morning. MD Vallarie Gauze made aware.

## 2023-08-24 NOTE — Progress Notes (Signed)
 Responded to code blue paged- arrived and met pts daughter in hallway-after comforting her and talking through what was happening she asked to speak to a doctor as she felt her mother would not want to be a full code in this regard- this chaplain grabbed the MD and offered compassionate presence and daughter spoke to MD. Code was stopped-pt made comfort care-offered support to daughter and ensured she was comfortable in the room-daughter aware to reach out if further needs arise

## 2023-08-24 NOTE — Progress Notes (Signed)
 [facility] number : [phone]

## 2023-08-24 NOTE — Significant Event (Signed)
        Rapid response and subsequent CODE BLUE  CROSS COVER NOTE  NAME: Angelica Ferguson MRN: [id number] DOB : [birth year]    Concern as stated by nurse / staff   Responded to overhead rapid response     Pertinent findings on chart review: Patient admitted with rapid A-fib and possible CHF and possible sepsis.  Was getting fluids and received a dose of bisoprolol  sometime prior.  Blood pressure started trending down to the 60s over 30s, heart rate in the 1 teens, less responsive, rapid response called at that time.   Patient assessment Arrived at the same time as rapid response team.  Nurse at bedside.  History being obtained from both nurse and daughter at bedside. Daughter at bedside states her mother was vibrant and talking a couple hours prior.  Difficulty getting pulse Patient noted to be pale with agonal breathing Unable to get pulse or blood pressure-CODE BLUE announced and CPR initiated  Rapid arrival of ED provider, Dr. Author Board, and ICU nurse practitioner Rosaland Collie.Aaron Aas  CODE BLUE NOTE  Patient Name: Angelica Ferguson   MRN: [id number]   Date of Birth/ Sex: 1932-11-19 , female      Admission Date: 08/22/2023  Attending Provider: Cedric Cohn, DO  Primary Diagnosis: Atrial fibrillation with RVR (HCC)    I  Technical Description:  - CPR performance duration:  Please see code sheet  please see code sheet  - Was defibrillation or cardioversion used? Yes   - Was external pacer placed? No  - Was patient intubated pre/post CPR? Yes    Medications Administered: Y = Yes; Blank = No Amiodarone    Atropine    Calcium     Epinephrine     Lidocaine     Magnesium    Norepinephrine    Phenylephrine     Sodium bicarbonate    Vasopressin      Post CPR evaluation:  - Final Status - Was patient successfully resuscitated ? No Please refer to ED providers documentation of resuscitation and code record  About 7 to 8 minutes into resuscitative efforts, daughter asked to speak with  me through the chaplain  Daughter states that she would rather her mother be comfortable and that they were talking about it just this morning.  She requested that resuscitative efforts be discontinued.  Resuscitative efforts subsequently terminated, with daughter present at bedside, comfort measures initiated, and ET tube subsequently removed.  Daughter at bedside         Lanetta Pion, MD  07/28/2023, 1:19 AM      CRITICAL CARE Performed by: Lanetta Pion   Total critical care time: 32 minutes  Critical care time was exclusive of separately billable procedures and treating other patients.  Critical care was necessary to treat or prevent imminent or life-threatening deterioration.  Critical care was time spent personally by me on the following activities: development of treatment plan with patient and/or surrogate as well as nursing, discussions with consultants, evaluation of patient's response to treatment, examination of patient, obtaining history from patient or surrogate, ordering and performing treatments and interventions, ordering and review of laboratory studies, ordering and review of radiographic studies, pulse oximetry and re-evaluation of patient's condition.

## 2023-08-24 NOTE — Progress Notes (Signed)
 Full assessment and admission questions was not completed due to RED MEWS (elevated heart low BP and SOB)

## 2023-08-24 DEATH — deceased

## 2023-08-27 LAB — CULTURE, BLOOD (ROUTINE X 2)
Culture: NO GROWTH
Culture: NO GROWTH

## 2023-09-29 ENCOUNTER — Encounter: Admitting: Family

## 2023-11-13 ENCOUNTER — Ambulatory Visit: Admitting: Pulmonary Disease
